# Patient Record
Sex: Male | Born: 1940
Health system: Southern US, Community
[De-identification: ages and names within clinical notes are randomized; demographics above are authoritative.]

## PROBLEM LIST (undated history)

## (undated) DIAGNOSIS — I2510 Atherosclerotic heart disease of native coronary artery without angina pectoris: Secondary | ICD-10-CM

## (undated) DIAGNOSIS — I4819 Other persistent atrial fibrillation: Secondary | ICD-10-CM

## (undated) DIAGNOSIS — E785 Hyperlipidemia, unspecified: Secondary | ICD-10-CM

## (undated) DIAGNOSIS — Z8249 Family history of ischemic heart disease and other diseases of the circulatory system: Secondary | ICD-10-CM

## (undated) DIAGNOSIS — Z87891 Personal history of nicotine dependence: Secondary | ICD-10-CM

## (undated) DIAGNOSIS — Z9289 Personal history of other medical treatment: Secondary | ICD-10-CM

## (undated) DIAGNOSIS — F0390 Unspecified dementia without behavioral disturbance: Secondary | ICD-10-CM

## (undated) HISTORY — DX: Other persistent atrial fibrillation: I48.19

## (undated) HISTORY — DX: Hyperlipidemia, unspecified: E78.5

## (undated) HISTORY — PX: PERCUTANEOUS CORONARY STENT INTERVENTION (PCI-S): SHX6016

## (undated) HISTORY — DX: Atherosclerotic heart disease of native coronary artery without angina pectoris: I25.10

## (undated) HISTORY — DX: Family history of ischemic heart disease and other diseases of the circulatory system: Z82.49

## (undated) HISTORY — DX: Personal history of other medical treatment: Z92.89

## (undated) HISTORY — PX: CARDIAC CATHETERIZATION: SHX172

## (undated) HISTORY — DX: Personal history of nicotine dependence: Z87.891

## (undated) HISTORY — PX: CORONARY STENT PLACEMENT: SHX1402

---

## 2005-03-12 ENCOUNTER — Observation Stay (HOSPITAL_COMMUNITY): Admission: EM | Admit: 2005-03-12 | Discharge: 2005-03-15 | Payer: Self-pay | Admitting: *Deleted

## 2005-03-29 ENCOUNTER — Ambulatory Visit: Payer: Self-pay | Admitting: Cardiology

## 2005-08-15 ENCOUNTER — Ambulatory Visit: Payer: Self-pay | Admitting: Cardiology

## 2005-08-16 ENCOUNTER — Ambulatory Visit: Payer: Self-pay | Admitting: Cardiology

## 2006-12-17 ENCOUNTER — Ambulatory Visit: Payer: Self-pay | Admitting: Cardiology

## 2006-12-17 LAB — CONVERTED CEMR LAB
Cholesterol: 122 mg/dL (ref 0–200)
HDL: 49.6 mg/dL (ref >39.0)
LDL Cholesterol: 68 mg/dL (ref 0–99)
Total CHOL/HDL Ratio: 2.5
Triglycerides: 24 mg/dL (ref 0–149)
VLDL: 5 mg/dL (ref 0–40)

## 2006-12-18 ENCOUNTER — Ambulatory Visit: Payer: Self-pay | Admitting: Cardiology

## 2006-12-18 LAB — CONVERTED CEMR LAB: TSH: 1.62 microintl units/mL (ref 0.35–5.50)

## 2008-01-22 ENCOUNTER — Ambulatory Visit: Payer: Self-pay | Admitting: Cardiology

## 2008-01-23 ENCOUNTER — Ambulatory Visit: Payer: Self-pay | Admitting: Cardiology

## 2008-01-23 LAB — CONVERTED CEMR LAB
ALT: 23 units/L (ref 0–53)
AST: 23 units/L (ref 0–37)
Albumin: 3.6 g/dL (ref 3.5–5.2)
Alkaline Phosphatase: 66 units/L (ref 39–117)
Bilirubin, Direct: 0.2 mg/dL (ref 0.0–0.3)
Cholesterol: 237 mg/dL (ref 0–200)
Direct LDL: 190 mg/dL
HDL: 46.4 mg/dL (ref >39.0)
Total Bilirubin: 1 mg/dL (ref 0.3–1.2)
Total CHOL/HDL Ratio: 5.1
Total Protein: 7.2 g/dL (ref 6.0–8.3)
Triglycerides: 35 mg/dL (ref 0–149)
VLDL: 7 mg/dL (ref 0–40)

## 2008-10-08 ENCOUNTER — Telehealth: Payer: Self-pay | Admitting: Cardiology

## 2008-12-22 ENCOUNTER — Encounter (INDEPENDENT_AMBULATORY_CARE_PROVIDER_SITE_OTHER): Payer: Self-pay | Admitting: *Deleted

## 2010-09-13 NOTE — Assessment & Plan Note (Signed)
[facility]                            CARDIOLOGY OFFICE NOTE   NAME:Lader, Tyaire                          MRN:          [id number]  DATE:12/18/2006                            DOB:          01-15-1941    Mr. Niemczyk is a pleasant, active gentleman who has coronary disease  that has been quite stable.  He is very knowledgeable about diet and he  is concerned because he has gained some weight even though his calories  are limited.  I mentioned to him that we will check a TSH to be sure  that there is no thyroid issue.  Otherwise, I think it is unlikely that  there is any metabolic basis other than the calories, although he does  limit his calories.  He is not having chest pain or shortness of breath.  He goes about full activities.   PAST MEDICAL HISTORY:  ALLERGIES:  NO KNOWN DRUG ALLERGIES.   MEDICATIONS:  Aspirin, Zocor, Toprol XL, fish oil, coenzyme Q-10 and  magnesium   OTHER MEDICAL PROBLEMS:  See the list below.   REVIEW OF SYSTEMS:  Patient has a rash that is diffuse, most marked on  his hands.  I wonder if this is psoriasis.  I have encouraged him to see  a Dermatologist.  I do not think that this is a drug rash.  Otherwise  his review of systems is negative.   PHYSICAL EXAMINATION:  Blood pressure is 138/84 with a pulse of 64.  The  patient is oriented to person, time and place and his affect is normal.  HEENT:  Reveals no xanthelasma.  He has normal extraocular motion.  SKIN:  Reveals an erythematous rash with some scaling that may be  psoriasis, it is diffuse.  There are no carotid bruits.  There is no jugular venous distention.  LUNGS:  Clear.  Respiratory effort is not labored.  CARDIAC:  Reveals an S1 with an S2, there are no clicks or significant  murmurs.  ABDOMEN:  Soft.  There are no masses or bruits.  There are normal bowel  sounds.  He has no peripheral edema.  There are no musculoskeletal deformities.   EKG is normal.  His  recent cholesterol showed an LDL of 68 with an HDL  of 49 and triglycerides of 24 on 40 of Zocor and he is quite stable with  this.   The patient had questions about magnesium.  I told him I felt that his  diet would be adequate but that there is no problem with a small  magnesium supplement.  Concerning vitamin D, I do think it is very  reasonable to take a small, appropriate supplement of vitamin D.   PROBLEMS:  1. Remote tobacco use.  2. Hyperlipidemia, nicely treated.  3. Coronary disease, treated with a Liberty bare metal stent in the      past.  4. History of good left ventricular function.  5. Skin rash that may be psoriasis, I have asked him to see a      Dermatologist and I would strongly recommend  this.     Luis Abed, MD, [facility]  Electronically Signed    JDK/MedQ  DD: 12/18/2006  DT: 12/18/2006  Job #: [id number]   cc:   [facility]

## 2010-09-13 NOTE — Assessment & Plan Note (Signed)
[facility]                            CARDIOLOGY OFFICE NOTE   NAME:[name], [name]                          MRN:          [id number]  DATE:01/22/2008                            DOB:          Nov 29, 1940    [name] [name] is here for cardiology followup.  I saw him a year ago.  At  that time, he had a rash and he did see Dermatology.  He was treated  with a cream that did help.  It seems that he gets this rash at this  time of the year and he has had a return of the same thing.  He has  gained weight.  His blood pressure is elevated by our measurement today.  He checks his pressure at home reliably and it is definitely not  elevated at home.  He will buy a new blood pressure cuff and report his  blood pressures to [place].   The patient has coronary disease.  He needs be on a statin.  In early  May, he had cramps in his legs.  This was at nighttime.  He called and  stopped his Zocor and about a week later, the cramps disappeared.  We  are not sure if it is related to Zocor.   He is not having any chest pain, shortness of breath, or syncope or  presyncope.   ALLERGIES:  No known drug allergies, but he may not tolerate statins  adequately.   MEDICATIONS:  Aspirin, Toprol, fish oil, coenzyme Q10, and magnesium.  He will restart his Zocor.   OTHER MEDICAL PROBLEMS:  See the list below.   REVIEW OF SYSTEMS:  The patient's rash is back.  He also has diffuse  mild areas of mild swelling if not induration of his skin.  He needs to  follow up with Dermatology.  The etiology of his rash is not clear to  me.  Otherwise, his review of systems is negative.   PHYSICAL EXAMINATION:  VITAL SIGNS:  As mentioned when he first came in,  his blood pressure was elevated.  It was in the range of 170/100.  He  will check it carefully at home.  GENERAL:  The patient is oriented to person, time, and place.  Affect is  normal.  HEENT:  Reveals no xanthelasma.  He has normal  extraocular motion.  There are no carotid bruits.  There is no jugular venous distention.  LUNGS:  Clear.  Respiratory effort is not labored.  CARDIAC:  Reveals an S1 with an S2.  There are no clicks or significant  murmurs.  ABDOMEN:  Soft.  The patient has mild swelling in both his ankles.  The  left is greater than the right.  He has 2+ posterior tibial pulses.  He  has a skin rash on his arm and on his chest and around his neck.   LABORATORY DATA:  EKG is normal.   PROBLEMS:  1. History of remote tobacco use.  2. Hyperlipidemia.  See the discussion above.  I have encouraged him      to restart  his Zocor.  3. Coronary artery disease with a Liberte bare-metal stent in the      past.  4. History of good left ventricular function.  5. Skin rash.   I have encouraged the patient to try to restart his Zocor when he can.  He will have the skin rash looked into.  He also has gained weight and  we talked about this and he will try to lose weight.     I will see him back in 1 year for cardiology followup.     Luis Abed, MD, [facility]  Electronically Signed    JDK/MedQ  DD: 01/22/2008  DT: 01/23/2008  Job #: [id number]   cc:   [facility] [place]

## 2010-09-16 NOTE — Cardiovascular Report (Signed)
NAMEYITZCHOK, CARRIGER NO.:  [id number]   MEDICAL RECORD NO.:  [id number]          PATIENT TYPE:  INP   LOCATION:  3304                         FACILITY:  [facility]   PHYSICIAN:  Rollene Rotunda, M.D.   DATE OF BIRTH:  Jun 21, 1940   DATE OF PROCEDURE:  03/13/2005  DATE OF DISCHARGE:                              CARDIAC CATHETERIZATION   PRIMARY CARE PHYSICIAN:  Dr. __________   REASON FOR PRESENTATION:  Evaluate patient with chest pain.   PROCEDURAL NOTE:  Left heart catheterization performed via the right femoral  artery.  The artery was cannulated using anterior wall puncture.  A #6-  French arterial sheath was inserted via the modified Seldinger technique.  Preformed Judkins and a pigtail catheter were utilized.  Patient tolerated  procedure well and left the laboratory in stable condition.   RESULTS:   HEMODYNAMICS:  LV 110/11, AO 110/67.   CORONARIES:  The left main had luminal irregularities.  The LAD had proximal  90% stenosis followed by mid and distal diffuse luminal irregularities as  the vessel wrapped the apex.  A first diagonal was small with mid 30%  stenosis.  Second diagonal branching with an ostial 25% stenosis.  An  inferior branch had a 60% stenosis, but was a small segment.  Second septal  perforator had ostial 95% stenosis.  The circumflex in the AV groove had  diffuse luminal irregularities.  A mid obtuse marginal had a long proximal  25% stenosis.  The right coronary artery was a large, dominant vessel.  There was long 25% stenosis.  PDA was moderate in size with luminal  irregularities.  Posterolateral 1 was moderate size with luminal  irregularities.  Posterolateral 2 was small to moderate with ostial 30%  stenosis.   LEFT VENTRICULOGRAM:  Left ventriculogram was obtained in the RAO  projection.  The EF was 65% with normal wall motion.   CONCLUSION:  Severe single vessel left anterior descending stenosis.  Well  preserved ejection  fraction.   PLAN:  I have reviewed this with Dr. Juanda Chance.  The LAD lesion is proximal.  We will need to look at films further and discuss this to decide whether  management with stenting is optimal versus treatment with single vessel  CABG.           ______________________________  Rollene Rotunda, M.D.     JH/MEDQ  D:  03/13/2005  T:  03/13/2005  Job:  [id number]

## 2010-09-16 NOTE — Cardiovascular Report (Signed)
[name], [name] [name] NO.:  [id number]   MEDICAL RECORD NO.:  [id number]          PATIENT TYPE:  INP   LOCATION:  6527                         FACILITY:  [facility]   PHYSICIAN:  Charlies Constable, M.D. [facility] DATE OF BIRTH:  1941/04/09   DATE OF PROCEDURE:  03/14/2005  DATE OF DISCHARGE:                              CARDIAC CATHETERIZATION   OPERATION/PROCEDURE:  Percutaneous coronary intervention.   CLINICAL HISTORY:  Mr. [name] [name] [name] is 70 years old and is self-employed and has  his own business.  He is an Art gallery manager.  He was admitted to the hospital after  a syncopal episode with sharp, burning chest pain.  His enzymes were  negative for myocardial infarction but he had anterior T wave changes.  He  underwent angiography by Dr. Antoine Poche and was found to have a tight lesion  in the ostium of the LAD, estimated at 90%.  He had moderate disease in the  mid LAD, estimated at 50-70% and he had non-obstructive disease in the  circumflex and right coronary artery and normal left ventricular function.  After some debate, between revascularization with surgery versus PCI, the  decision was made to perform PCI.  We had planned to debulk the lesion with  atherectomy prior to stenting   DESCRIPTION OF PROCEDURE:  The procedure was performed via the left femoral  artery and arterial sheath and an 8-French Q-3.5 guiding catheter with  sideholes.  The patient was given Angiomax bolus infusion and was given 600  mg of Plavix three to four hours before the procedure.  We initially crossed  the lesion with the Flexi wire without difficulty.  We predilated with 2.5 x  20 mm Maverick, performing two inflations after 8 atmospheres for 30  seconds.  We then passed the 3.5 x 4 Flexi-cutter into the ostium of the  LAD.  We performed four cuts at 1/2 atmospheres and four cuts at 1  atmospheres and then removed the cutter and obtained a moderate amount of  material with improvement in the lumen  diameter.  We then went back with the  cutter and performed four cuts at 2 atmospheres and four cuts at 3  atmospheres, and then removed the cutter and got moderately large amount of  atheromatous material.  This resulted in a good lumen that appeared to be  about 30% narrowed.   We then performed intravascular ultrasound to assess the size of the lesion.  The lumen appeared to be about 3 mm and the distal reference appeared to be  about 3.5 mm in diameters.   We chose a 3.5 x 12 mm Liberte stent and we were very careful to try and  position the edge of the stent right at the ostium of the LAD.  The purpose  of our debulking first was so that we would not compromise the circumflex  when we deployed the stent.  We then deployed the stent with one inflation  to 14 atmospheres for 30 seconds.  We pulse dilated the distal portion of  the stent with a 4 x 8 mm Quantum Maverick and  performing one inflation up  to 14 atmospheres for 30 seconds.  We then performed another IVUS run.  Final diagnostic study was then performed through the guiding catheter.  The  left femoral artery was closed with AngioSeal at the end of the procedure.   It was a long procedure but the patient tolerated the procedure well and  left the laboratory in satisfactory condition.   RESULTS:  Initially the stenosis in the ostium of the LAD was estimated at  90%.  Following directional atherectomy, this improved to 30% and following  stenting, this improved to 0%.  On IVUS, the stent was about 1.5 mm from the  ostium although the ostial lumen was 3 mm.  The stent diameter appeared to  be about 3.5 mm with good distal transition.   CONCLUSION:  Successful percutaneous coronary intervention of the lesion in  the ostium of the left anterior descending artery using directional  atherectomy and a bare metal stent with improvement in central narrowing  from 90% to 0%.   DISPOSITION:  The patient returned to the anesthesia  room for further  observation.           ______________________________  Charlies Constable, M.D. [facility]     BB/MEDQ  D:  03/14/2005  T:  03/15/2005  Job:  [id number]   cc:   Willa Rough, M.D.  [address]  [address]  [place]  [place] [address]   [facility]   Cardiopulmonary Lab
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
## 2010-09-16 NOTE — Discharge Summary (Signed)
[name] [name], [name] [name]                 ACCOUNT NO.:  [id number]   MEDICAL RECORD NO.:  [id number]          PATIENT TYPE:  INP   LOCATION:  6527                         FACILITY:  [facility]   PHYSICIAN:  Charlies Constable, M.D. [facility] DATE OF BIRTH:  09-Sep-1940   DATE OF ADMISSION:  03/12/2005  DATE OF DISCHARGE:  03/15/2005                                 DISCHARGE SUMMARY   PRINCIPAL DIAGNOSIS:  Chest pain, coronary artery disease.   OTHER DIAGNOSES:  1.  Remote tobacco abuse.  2.  Hyperlipidemia.  3.  Family history of early coronary artery disease.   ALLERGIES:  No known drug allergies.   PROCEDURE:  Left heart cardiac catheterization, followed by PCI and stenting  of the proximal LAD with bare mental stent.   HISTORY OF PRESENT ILLNESS:  A 70 year old white male with no prior history  of CAD who presented to the emergency room on March 12, 2005, following a  2 to 3-week history of right sided exertional chest discomfort that became  progressively more severe and frequent on the day of admission.  He ruled  out for MI and was admitted for further evaluation.   HOSPITAL COURSE:  Mr. Ala underwent left heart cardiac catheterization,  on March 13, 2005, which revealed a 90% lesion in the proximal LAD and  otherwise nonobstructive coronary disease.  LV function was normal with an  EF of 65% and normal wall motion.  Films were reviewed with Dr. Juanda Chance and  the patient was taken back to the cardiac cath lab, on March 14, 2005,  for successful PCI and stenting of the proximal LAD with a 3.5 x 12-mm  Liberte bare metal stent.  The patient tolerated this procedure well and has  been ambulating this morning without any recurrent chest discomfort or  limitations.  He is being discharged home today in satisfactory condition.   DISCHARGE LABORATORY:  Hemoglobin 13.1, hematocrit 38.2, WBC 6.1, platelets  128, MCV 88.0.  Sodium 137, potassium 3.7, chloride 110, CO2 23, BUN 10,  creatinine  1.1, glucose 87.  Total bilirubin 0.8, alkaline phosphatase 66,  AST 38, ALT 30.  CK 78, MB 2.0, troponin I 0.12.  Total cholesterol 130,  triglycerides 32, HDL 46, LDL 78.  Calcium 8.3.  TSH 2.830.   DISPOSITION:  The patient is being discharged home today in good condition.   FOLLOWUP PLANS AND APPOINTMENTS:  The patient is to follow up with Dr. Myrtis Ser  in [facility] on March 29, 2005 at 9:45 a.m.   DISCHARGE MEDICATIONS:  1.  Aspirin 325 mg every day.  2.  Plavix 75 mg every day.  3.  Zocor 40 mg q.h.s.  4.  Toprol XL 50 mg every day.  5.  Nitroglycerin 0.4 mg sublingual p.r.n. chest pain.   OUTSTANDING LABORATORY/STUDIES:  None.   DURATION OF DISCHARGE ENCOUNTER:  Forty minutes including physician time.      Ok Anis, NP    ______________________________  Charlies Constable, M.D. [facility]    CRB/MEDQ  D:  03/15/2005  T:  03/15/2005  Job:  [id number]

## 2015-01-05 DIAGNOSIS — H15001 Unspecified scleritis, right eye: Secondary | ICD-10-CM | POA: Diagnosis not present

## 2015-01-05 DIAGNOSIS — H35033 Hypertensive retinopathy, bilateral: Secondary | ICD-10-CM | POA: Diagnosis not present

## 2015-01-05 DIAGNOSIS — H35372 Puckering of macula, left eye: Secondary | ICD-10-CM | POA: Diagnosis not present

## 2015-01-05 DIAGNOSIS — H2513 Age-related nuclear cataract, bilateral: Secondary | ICD-10-CM | POA: Diagnosis not present

## 2015-01-07 DIAGNOSIS — I252 Old myocardial infarction: Secondary | ICD-10-CM | POA: Diagnosis not present

## 2015-01-07 DIAGNOSIS — H2513 Age-related nuclear cataract, bilateral: Secondary | ICD-10-CM | POA: Diagnosis not present

## 2015-01-07 DIAGNOSIS — Z87891 Personal history of nicotine dependence: Secondary | ICD-10-CM | POA: Diagnosis not present

## 2015-01-07 DIAGNOSIS — H30033 Focal chorioretinal inflammation, peripheral, bilateral: Secondary | ICD-10-CM | POA: Diagnosis not present

## 2015-01-07 DIAGNOSIS — H35033 Hypertensive retinopathy, bilateral: Secondary | ICD-10-CM | POA: Diagnosis not present

## 2015-01-07 DIAGNOSIS — H35371 Puckering of macula, right eye: Secondary | ICD-10-CM | POA: Diagnosis not present

## 2015-01-07 DIAGNOSIS — Z113 Encounter for screening for infections with a predominantly sexual mode of transmission: Secondary | ICD-10-CM | POA: Diagnosis not present

## 2015-01-12 DIAGNOSIS — H15001 Unspecified scleritis, right eye: Secondary | ICD-10-CM | POA: Diagnosis not present

## 2015-01-12 DIAGNOSIS — H35033 Hypertensive retinopathy, bilateral: Secondary | ICD-10-CM | POA: Diagnosis not present

## 2015-01-12 DIAGNOSIS — H35371 Puckering of macula, right eye: Secondary | ICD-10-CM | POA: Diagnosis not present

## 2015-02-02 DIAGNOSIS — H35371 Puckering of macula, right eye: Secondary | ICD-10-CM | POA: Diagnosis not present

## 2015-02-02 DIAGNOSIS — H35033 Hypertensive retinopathy, bilateral: Secondary | ICD-10-CM | POA: Diagnosis not present

## 2015-02-02 DIAGNOSIS — H15001 Unspecified scleritis, right eye: Secondary | ICD-10-CM | POA: Diagnosis not present

## 2015-02-02 DIAGNOSIS — H2513 Age-related nuclear cataract, bilateral: Secondary | ICD-10-CM | POA: Diagnosis not present

## 2015-02-23 DIAGNOSIS — H15001 Unspecified scleritis, right eye: Secondary | ICD-10-CM | POA: Diagnosis not present

## 2015-02-23 DIAGNOSIS — H35371 Puckering of macula, right eye: Secondary | ICD-10-CM | POA: Diagnosis not present

## 2015-02-23 DIAGNOSIS — H35033 Hypertensive retinopathy, bilateral: Secondary | ICD-10-CM | POA: Diagnosis not present

## 2015-02-23 DIAGNOSIS — H2513 Age-related nuclear cataract, bilateral: Secondary | ICD-10-CM | POA: Diagnosis not present

## 2015-04-07 DIAGNOSIS — H21542 Posterior synechiae (iris), left eye: Secondary | ICD-10-CM | POA: Diagnosis not present

## 2015-04-07 DIAGNOSIS — H2513 Age-related nuclear cataract, bilateral: Secondary | ICD-10-CM | POA: Diagnosis not present

## 2015-04-13 DIAGNOSIS — H15001 Unspecified scleritis, right eye: Secondary | ICD-10-CM | POA: Diagnosis not present

## 2015-04-13 DIAGNOSIS — H21542 Posterior synechiae (iris), left eye: Secondary | ICD-10-CM | POA: Diagnosis not present

## 2015-04-13 DIAGNOSIS — H2513 Age-related nuclear cataract, bilateral: Secondary | ICD-10-CM | POA: Diagnosis not present

## 2015-06-15 DIAGNOSIS — H2513 Age-related nuclear cataract, bilateral: Secondary | ICD-10-CM | POA: Diagnosis not present

## 2015-06-15 DIAGNOSIS — H21542 Posterior synechiae (iris), left eye: Secondary | ICD-10-CM | POA: Diagnosis not present

## 2015-06-15 DIAGNOSIS — H15001 Unspecified scleritis, right eye: Secondary | ICD-10-CM | POA: Diagnosis not present

## 2015-06-15 DIAGNOSIS — H35371 Puckering of macula, right eye: Secondary | ICD-10-CM | POA: Diagnosis not present

## 2015-07-06 DIAGNOSIS — H15001 Unspecified scleritis, right eye: Secondary | ICD-10-CM | POA: Diagnosis not present

## 2015-07-06 DIAGNOSIS — H35371 Puckering of macula, right eye: Secondary | ICD-10-CM | POA: Diagnosis not present

## 2015-07-06 DIAGNOSIS — H2513 Age-related nuclear cataract, bilateral: Secondary | ICD-10-CM | POA: Diagnosis not present

## 2015-07-06 DIAGNOSIS — H532 Diplopia: Secondary | ICD-10-CM | POA: Diagnosis not present

## 2015-07-06 DIAGNOSIS — H21542 Posterior synechiae (iris), left eye: Secondary | ICD-10-CM | POA: Diagnosis not present

## 2015-07-09 ENCOUNTER — Encounter (HOSPITAL_COMMUNITY): Payer: Self-pay | Admitting: *Deleted

## 2015-07-09 ENCOUNTER — Ambulatory Visit (INDEPENDENT_AMBULATORY_CARE_PROVIDER_SITE_OTHER): Payer: Medicare Other | Admitting: Cardiovascular Disease

## 2015-07-09 VITALS — BP 140/90 | HR 100 | Ht 69.0 in | Wt 186.8 lb

## 2015-07-09 DIAGNOSIS — R7989 Other specified abnormal findings of blood chemistry: Secondary | ICD-10-CM

## 2015-07-09 DIAGNOSIS — I4891 Unspecified atrial fibrillation: Secondary | ICD-10-CM | POA: Diagnosis not present

## 2015-07-09 DIAGNOSIS — E785 Hyperlipidemia, unspecified: Secondary | ICD-10-CM | POA: Insufficient documentation

## 2015-07-09 LAB — COMPREHENSIVE METABOLIC PANEL
ALK PHOS: 52 U/L (ref 40–115)
ALT: 14 U/L (ref 9–46)
AST: 19 U/L (ref 10–35)
Albumin: 4 g/dL (ref 3.6–5.1)
BUN: 16 mg/dL (ref 7–25)
CO2: 26 mmol/L (ref 20–31)
CREATININE: 1.23 mg/dL — AB (ref 0.70–1.18)
Calcium: 9.3 mg/dL (ref 8.6–10.3)
Chloride: 104 mmol/L (ref 98–110)
Glucose, Bld: 88 mg/dL (ref 65–99)
Potassium: 4.4 mmol/L (ref 3.5–5.3)
SODIUM: 140 mmol/L (ref 135–146)
Total Bilirubin: 0.7 mg/dL (ref 0.2–1.2)
Total Protein: 7 g/dL (ref 6.1–8.1)

## 2015-07-09 LAB — TSH: TSH: 2.03 m[IU]/L (ref 0.40–4.50)

## 2015-07-09 LAB — CBC
HCT: 44.2 % (ref 39.0–52.0)
HEMOGLOBIN: 14.5 g/dL (ref 13.0–17.0)
MCH: 28.9 pg (ref 26.0–34.0)
MCHC: 32.8 g/dL (ref 30.0–36.0)
MCV: 88.2 fL (ref 78.0–100.0)
MPV: 10.7 fL (ref 8.6–12.4)
Platelets: 146 10*3/uL — ABNORMAL LOW (ref 150–400)
RBC: 5.01 MIL/uL (ref 4.22–5.81)
RDW: 15.5 % (ref 11.5–15.5)
WBC: 5.7 10*3/uL (ref 4.0–10.5)

## 2015-07-09 MED ORDER — RIVAROXABAN 20 MG PO TABS: 20.0000 mg | ORAL_TABLET | Freq: Every day | ORAL | Status: DC

## 2015-07-09 MED ORDER — METOPROLOL SUCCINATE ER 25 MG PO TB24: 25.0000 mg | ORAL_TABLET | Freq: Every day | ORAL | Status: DC

## 2015-07-09 NOTE — Progress Notes (Signed)
Cardiology Office Note Date:  07/09/2015   ID:  [name] [name], DOB 10-Sep-1940, MRN [id number]  PCP:  No primary care provider on file.  Cardiologist:  Tonny Bollman, MD    Chief Complaint  Patient presents with  . New Evaluation    Eye doctor recommended he be seen. Denies any chest pain, sob, lee, or claudication     History of Present Illness: [name] [name] is a 75 y.o. male who presents for cardiac evaluation. He was seen recently by his opthamologist and was recommended to undergo cardiac evaluation. He has developed double vision and there was concern that he may have had an optical stroke. An MRI was recommended but he hasn't pursued this yet.   The patient has noticed his heart rate has been irregular and higher than normal over the past few months. He denies palpitations.  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   He complains of fatigue but really does not have specific cardiac complaints. Today, he denies symptoms of palpitations, chest pain, shortness of breath, orthopnea, PND, lower extremity edema, dizziness, or syncope.  He does have a hx of CAD - cath notes copied below. He underwent PCI in 2006 when he was found to have severe proximal LAD stenosis. He initially was seen by Dr Myrtis Ser but hasn't had cardiology FU in many years. He was intolerant/allergic to statin drugs. He's been off of plavix many years and has only recently started taking Aspirin again.  Past Medical History  Diagnosis Date  . History of tobacco abuse   . Hyperlipidemia   . FH: CAD (coronary artery disease)   . Chest pain   . CAD (coronary artery disease)     Past Surgical History  Procedure Laterality Date  . Cardiac catheterization    . Coronary stent placement      stents of the proximal lad with bare metal stent  . Percutaneous coronary stent intervention (pci-s)      Current Outpatient Prescriptions  Medication Sig  Dispense Refill  . Ascorbic Acid (VITAMIN C PO) Take 1 tablet by mouth daily.    Marland Kitchen aspirin 325 MG EC tablet Take 325 mg by mouth daily.    . Cholecalciferol (VITAMIN D3) 3000 units TABS Take 1 tablet by mouth daily.    . Coenzyme Q10 (CO Q 10 PO) Take 1 capsule by mouth daily.    Marland Kitchen MAGNESIUM PO Take 1 tablet by mouth daily.    . Menaquinone-7 (VITAMIN K2 PO) Take 1 tablet by mouth daily.     No current facility-administered medications for this visit.    Allergies:   Review of patient's allergies indicates no known allergies.   Social History:  The patient  reports that he quit smoking about 37 years ago. He has never used smokeless tobacco. He reports that he does not drink alcohol or use illicit drugs.   Family History:  The patient's  family history includes Cancer - Colon in his mother; Heart attack in his father; Hypertension in his father.   ROS:  Please see the history of present illness.  Otherwise, review of systems is positive for sinus congestion, cough, double vision, eczema.  All other systems are reviewed and negative.   PHYSICAL EXAM: VS:  BP 140/90 mmHg  Pulse 100  Ht  (1.753 m)  Wt 186 lb 12.8 oz (84.732 kg)  BMI 27.57 kg/m2 , BMI Body mass index is 27.57 kg/(m^2).  GEN: Well nourished, well developed, in no acute distress HEENT: normal Neck: no JVD, no masses. No carotid bruits Cardiac: irregularly irregular without murmur or gallop          Respiratory:  clear to auscultation bilaterally, normal work of breathing GI: soft, nontender, nondistended, + BS MS: no deformity or atrophy Ext: no pretibial edema, pedal pulses 2+= bilaterally Skin: warm and dry. There is a diffuse rash on the trunk Neuro:  Strength and sensation are intact Psych: euthymic mood, full affect  EKG:  EKG is ordered today. The ekg ordered today shows atrial fibrillation with RVR, HR 107 bpm, inferior infarct age-undetermined  Recent Labs: No results found for requested labs within  last 365 days.   Lipid Panel     Component Value Date/Time   CHOL 237* 01/23/2008 1110   TRIG 35 01/23/2008 1110   HDL 46.4 01/23/2008 1110   CHOLHDL 5.1 CALC 01/23/2008 1110   VLDL 7 01/23/2008 1110   LDLCALC 68 12/17/2006 1032   LDLDIRECT 190.0 01/23/2008 1110      Wt Readings from Last 3 Encounters:  07/09/15 186 lb 12.8 oz (84.732 kg)     Cardiac Studies Reviewed: Cath - 2006: HEMODYNAMICS: LV 110/11, AO 110/67.  CORONARIES: The left main had luminal irregularities. The LAD had proximal 90% stenosis followed by mid and distal diffuse luminal irregularities as the vessel wrapped the apex. A first diagonal was small with mid 30% stenosis. Second diagonal branching with an ostial 25% stenosis. An inferior branch had a 60% stenosis, but was a small segment. Second septal perforator had ostial 95% stenosis. The circumflex in the AV groove had diffuse luminal irregularities. A mid obtuse marginal had a long proximal 25% stenosis. The right coronary artery was a large, dominant vessel. There was long 25% stenosis. PDA was moderate in size with luminal irregularities. Posterolateral 1 was moderate size with luminal irregularities. Posterolateral 2 was small to moderate with ostial 30% stenosis.  LEFT VENTRICULOGRAM: Left ventriculogram was obtained in the RAO projection. The EF was 65% with normal wall motion.  CONCLUSION: Severe single vessel left anterior descending stenosis. Well preserved ejection fraction.  PLAN: I have reviewed this with Dr. Juanda ChanceBrodie. The LAD lesion is proximal. We will need to look at films further and discuss this to decide whether management with stenting is optimal versus treatment with single vessel CABG.  PCI - 2006: OPERATION/PROCEDURE: Percutaneous coronary intervention.  CLINICAL HISTORY: Mr. 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 He was  admitted to the hospital after a syncopal episode with sharp, burning chest pain. His enzymes were negative for myocardial infarction but he had anterior T wave changes. He underwent angiography by Dr. Antoine PocheHochrein and was found to have a tight lesion in the ostium of the LAD, estimated at 90%. He had moderate disease in the mid LAD, estimated at 50-70% and he had non-obstructive disease in the circumflex and right coronary artery and normal left ventricular function. After some debate, between revascularization with surgery versus PCI, the decision was made to perform PCI. We had planned to debulk the lesion with atherectomy prior to stenting  DESCRIPTION OF PROCEDURE: The procedure was performed via the left femoral artery and arterial sheath and an 8-French Q-3.5 guiding catheter with sideholes. The patient was given Angiomax bolus infusion and was given 600 mg of Plavix three to four hours before the procedure. We initially crossed the lesion with the Flexi wire without  difficulty. We predilated with 2.5 x 20 mm Maverick, performing two inflations after 8 atmospheres for 30 seconds. We then passed the 3.5 x 4 Flexi-cutter into the ostium of the LAD. We performed four cuts at 1/2 atmospheres and four cuts at 1 atmospheres and then removed the cutter and obtained a moderate amount of material with improvement in the lumen diameter. We then went back with the cutter and performed four cuts at 2 atmospheres and four cuts at 3 atmospheres, and then removed the cutter and got moderately large amount of atheromatous material. This resulted in a good lumen that appeared to be about 30% narrowed.  We then performed intravascular ultrasound to assess the size of the lesion. The lumen appeared to be about 3 mm and the distal reference appeared to be about 3.5 mm in diameters.  We chose a 3.5 x 12 mm Liberte stent and we were very careful to try  and position the edge of the stent right at the ostium of the LAD. The purpose of our debulking first was so that we would not compromise the circumflex when we deployed the stent. We then deployed the stent with one inflation to 14 atmospheres for 30 seconds. We pulse dilated the distal portion of the stent with a 4 x 8 mm Quantum Maverick and performing one inflation up to 14 atmospheres for 30 seconds. We then performed another IVUS run. Final diagnostic study was then performed through the guiding catheter. The left femoral artery was closed with AngioSeal at the end of the procedure.  It was a long procedure but the patient tolerated the procedure well and left the laboratory in satisfactory condition.  RESULTS: Initially the stenosis in the ostium of the LAD was estimated at 90%. Following directional atherectomy, this improved to 30% and following stenting, this improved to 0%. On IVUS, the stent was about 1.5 mm from the ostium although the ostial lumen was 3 mm. The stent diameter appeared to be about 3.5 mm with good distal transition.  CONCLUSION: Successful percutaneous coronary intervention of the lesion in the ostium of the left anterior descending artery using directional atherectomy and a bare metal stent with improvement in central narrowing from 90% to 0%.  DISPOSITION: [name] for further observation.  ASSESSMENT AND PLAN: 1.  Atrial fibrillation: unknown duration but suspect this has been ongoing at least 2-3 months based on patient's history. Lengthy discussion about implications of atrial fibrillation on both cardiovascular outcomes and stroke risk. Recommend the following:  2D echo to assess LV function, atrial size, valvular disease  Labs to include metabolic panel, CBC, TSH  CHADS-Vasc  = 3 (age, HTN, CAD) and possibly 5 if visual symptoms truly related to ocular stroke  Neurology  referral for appropriate imaging and stroke assessment  FU Afib clinic  Start anticoagulation with Xarelto 20 mg daily. Discussed indication, alternatives, appropriate actions if bleeding occurs  Start metoprolol for rate control  Consider cardioversion after 3 weeks therapeutic anticoagulation depending on echo/lab findings  2. CAD, native vessel: no angina at present. Stop ASA in setting oral anticoagulant Rx. Start beta-blocker. Pt statin-allergic  3. HTN - start metoprolol  Current medicines are reviewed with the patient today.  The patient does not have concerns regarding medicines.  Labs/ tests ordered today include:  No orders of the defined types were placed in this encounter.   Labs and cath notes reviewed as part of today's evaluation.   Disposition:   FU  AFib clinic. I will co-manage patient.  Enzo Bi, MD  07/09/2015 11:45 AM    [facility] [address] [place], [place], [place]  [address] Phone: [phone]; Fax: [phone]

## 2015-07-09 NOTE — Patient Instructions (Addendum)
Medication Instructions:  Your physician has recommended you make the following change in your medication:  1. START Xarelto 20mg  take one tablet by mouth daily with your evening meal 2. START Metoprolol Succinate 25mg  take one tablet by mouth daily at bedtime 3. STOP Aspirin  Labwork: Your physician recommends that you have lab work today: CMP, CBC, TSH   Testing/Procedures: Your physician has requested that you have an echocardiogram. Echocardiography is a painless test that uses sound waves to create images of your heart. It provides your doctor with information about the size and shape of your heart and how well your heart's chambers and valves are working. This procedure takes approximately one hour. There are no restrictions for this procedure.  Follow-Up: You have been referred to Dr Shon MilletAdam Jaffe with [facility] due to double vision and Atrial Fibrillation.   Your physician recommends that you schedule a follow-up appointment in: 4-6 weeks with the Atrial Fibrillation clinic   Any Other Special Instructions Will Be Listed Below (If Applicable).     If you need a refill on your cardiac medications before your next appointment, please call your pharmacy.

## 2015-07-23 ENCOUNTER — Ambulatory Visit (HOSPITAL_COMMUNITY): Payer: Medicare Other | Attending: Cardiovascular Disease

## 2015-07-23 ENCOUNTER — Other Ambulatory Visit: Payer: Self-pay

## 2015-07-23 DIAGNOSIS — I4891 Unspecified atrial fibrillation: Secondary | ICD-10-CM | POA: Diagnosis not present

## 2015-08-02 ENCOUNTER — Encounter: Payer: Self-pay | Admitting: Neurology

## 2015-08-02 ENCOUNTER — Ambulatory Visit (INDEPENDENT_AMBULATORY_CARE_PROVIDER_SITE_OTHER): Payer: Medicare Other | Admitting: Neurology

## 2015-08-02 ENCOUNTER — Other Ambulatory Visit (INDEPENDENT_AMBULATORY_CARE_PROVIDER_SITE_OTHER): Payer: Medicare Other

## 2015-08-02 VITALS — BP 134/84 | HR 97 | Ht 71.0 in | Wt 183.0 lb

## 2015-08-02 DIAGNOSIS — I4891 Unspecified atrial fibrillation: Secondary | ICD-10-CM

## 2015-08-02 DIAGNOSIS — H4922 Sixth [abducent] nerve palsy, left eye: Secondary | ICD-10-CM | POA: Diagnosis not present

## 2015-08-02 DIAGNOSIS — Z823 Family history of stroke: Secondary | ICD-10-CM

## 2015-08-02 DIAGNOSIS — E785 Hyperlipidemia, unspecified: Secondary | ICD-10-CM | POA: Diagnosis not present

## 2015-08-02 DIAGNOSIS — I729 Aneurysm of unspecified site: Secondary | ICD-10-CM

## 2015-08-02 LAB — LIPID PANEL
Cholesterol: 202 mg/dL — ABNORMAL HIGH (ref 0–200)
HDL: 61.7 mg/dL (ref >39.00)
LDL Cholesterol: 130 mg/dL — ABNORMAL HIGH (ref 0–99)
NonHDL: 140.41
Total CHOL/HDL Ratio: 3
Triglycerides: 50 mg/dL (ref 0.0–149.0)
VLDL: 10 mg/dL (ref 0.0–40.0)

## 2015-08-02 NOTE — Patient Instructions (Addendum)
I think you probably had a stroke 1.  Continue Xarelto 2.  We will get MRI and MRA of the head and carotid doppler 3.  I would like to check a fasting lipid panel 4.  Follow up after testing

## 2015-08-02 NOTE — Addendum Note (Signed)
Addended by: Sheilah MinsFOX, JADA A on: 08/02/2015 10:49 AM   Modules accepted: Orders

## 2015-08-02 NOTE — Addendum Note (Signed)
Addended by: Sheilah MinsFOX, JADA A on: 08/02/2015 10:02 AM   Modules accepted: Orders

## 2015-08-02 NOTE — Progress Notes (Signed)
NEUROLOGY CONSULTATION NOTE  [name] [name] MRN: [id number] DOB: Apr 30, 1941  Referring provider: Dr. Excell Seltzer Primary care provider: no PCP  Reason for consult:  Double vision, probable stroke  HISTORY OF PRESENT ILLNESS: [name] [name] is a 75 year old right-handed male with atrial fibrillation, hypertension, and CAD who presents for assessment of stroke in setting of atrial fibrillation.  History obtained by patient, and cardiology note.  EKG and labs reviewed.  In early March 2017, he developed horizontal double vision.  It resolves when closing either eye.  He denied associated headache or focal numbness and weakness.  He saw an ophthalmologist, who suspected an "optical stroke".  Over the past few months, he felt that his heart rate had been elevated and irregular.    He denied chest pain, shortness of breath, dizziness or loss of consciousness.  He saw cardiology, where EKG showed atrial fibrillation with RVR and heart rate of 107 bpm.  CBC and CMP were unremarkable except for borderline low platelet count of 146 and borderline elevated creatinine of 1.23.  Echocardiogram showed EF 55-60% with no cardiac source of emboli.  He was started on Xarelto.  Over the past month, he reports no change in vision.  He also reports history of recurrent eye infections.  He also reports exposure for possible mold.  PAST MEDICAL HISTORY: Past Medical History  Diagnosis Date  . History of tobacco abuse   . Hyperlipidemia   . FH: CAD (coronary artery disease)   . Chest pain   . CAD (coronary artery disease)     PAST SURGICAL HISTORY: Past Surgical History  Procedure Laterality Date  . Cardiac catheterization    . Coronary stent placement      stents of the proximal lad with bare metal stent  . Percutaneous coronary stent intervention (pci-s)      MEDICATIONS: Current Outpatient Prescriptions on File Prior to Visit  Medication Sig Dispense Refill  . Ascorbic Acid (VITAMIN C PO) Take 1 tablet by  mouth daily.    . Cholecalciferol (VITAMIN D3) 3000 units TABS Take 1 tablet by mouth daily.    . Coenzyme Q10 (CO Q 10 PO) Take 1 capsule by mouth daily.    Marland Kitchen MAGNESIUM PO Take 1 tablet by mouth daily.    . Menaquinone-7 (VITAMIN K2 PO) Take 1 tablet by mouth daily.    . metoprolol succinate (TOPROL-XL) 25 MG 24 hr tablet Take 1 tablet (25 mg total) by mouth at bedtime. 30 tablet 3  . rivaroxaban (XARELTO) 20 MG TABS tablet Take 1 tablet (20 mg total) by mouth daily with supper. 30 tablet 3   No current facility-administered medications on file prior to visit.    ALLERGIES: No Known Allergies  FAMILY HISTORY: Family History  Problem Relation Age of Onset  . Cancer - Colon Mother   . Heart attack Father   . Hypertension Father     SOCIAL HISTORY: Social History   Social History  . Marital Status: Married    Spouse Name: N/A  . Number of Children: N/A  . Years of Education: N/A   Occupational History  . Not on file.   Social History Main Topics  . Smoking status: Former Smoker    Quit date: 05/10/1978  . Smokeless tobacco: Never Used  . Alcohol Use: No  . Drug Use: No  . Sexual Activity: Not on file   Other Topics Concern  . Not on file   Social History Narrative    REVIEW OF  SYSTEMS: Constitutional: No fevers, chills, or sweats, no generalized fatigue, change in appetite Eyes: as above Ear, nose and throat: No hearing loss, ear pain, nasal congestion, sore throat Cardiovascular: No chest pain, palpitations Respiratory:  No shortness of breath at rest or with exertion, wheezes GastrointestinaI: No nausea, vomiting, diarrhea, abdominal pain, fecal incontinence Genitourinary:  No dysuria, urinary retention or frequency Musculoskeletal:  No neck pain, back pain Integumentary: No rash, pruritus, skin lesions Neurological: as above Psychiatric: No depression, insomnia, anxiety Endocrine: No palpitations, fatigue, diaphoresis, mood swings, change in appetite,  change in weight, increased thirst Hematologic/Lymphatic:  No anemia, purpura, petechiae. Allergic/Immunologic: no itchy/runny eyes, nasal congestion, recent allergic reactions, rashes  PHYSICAL EXAM: Filed Vitals:   08/02/15 0903  BP: 134/84  Pulse: 97   General: No acute distress.  Patient appears well-groomed.  Head:  Normocephalic/atraumatic Eyes:  fundi examined but not visualized Neck: supple, no paraspinal tenderness, full range of motion Back: No paraspinal tenderness Heart: regular rate and rhythm Lungs: Clear to auscultation bilaterally. Vascular: No carotid bruits. Neurological Exam: Mental status: alert and oriented to person, place, and time, recent and remote memory intact, fund of knowledge intact, attention and concentration intact, speech fluent and not dysarthric, language intact. Cranial nerves: CN I: not tested CN II: pupils equal, round and reactive to light, visual fields intact, CN III, IV, VI:  Left CN VI palsy, no nystagmus, no ptosis CN V: facial sensation intact CN VII: upper and lower face symmetric CN VIII: hearing intact CN IX, X: gag intact, uvula midline CN XI: sternocleidomastoid and trapezius muscles intact CN XII: tongue midline Bulk & Tone: normal, no fasciculations. Motor:  5/5 throughout  Sensation:  Pinprick and vibration sensation intact. Deep Tendon Reflexes:  2+ throughout, toes downgoing.  Finger to nose testing:  Without dysmetria.  Heel to shin:  Without dysmetria.  Gait:  Normal station and stride.  Able to turn and tandem walk. Romberg negative.  IMPRESSION: Left cranial nerve VI palsy.  Consider CVA.  In setting of newly diagnosed atrial fibrillation, likely cardioembolic, but should complete a full stroke workup.  However, we should rule out other possible causes of VI palsy in addition to stroke   PLAN: 1.  MRI with and without contrast and MRA of head 2.  Carotid doppler 3.  Fasting lipid panel 4.  Blood pressure  control 5.  Xarelto 6.  Eye patch 7.  Follow up after testing.   Thank you for allowing me to take part in the care of this patient.  Shon MilletAdam Aquan Kope, DO  CC:  Tonny BollmanMichael Cooper, MD

## 2015-08-04 ENCOUNTER — Ambulatory Visit
Admission: RE | Admit: 2015-08-04 | Discharge: 2015-08-04 | Disposition: A | Discharge status: Home / Self Care | Payer: Medicare Other | Source: Ambulatory Visit | Attending: Neurology | Admitting: Neurology

## 2015-08-04 DIAGNOSIS — I6523 Occlusion and stenosis of bilateral carotid arteries: Secondary | ICD-10-CM | POA: Diagnosis not present

## 2015-08-04 DIAGNOSIS — Z823 Family history of stroke: Secondary | ICD-10-CM

## 2015-08-05 ENCOUNTER — Encounter (HOSPITAL_COMMUNITY): Payer: Self-pay | Admitting: Nurse Practitioner

## 2015-08-05 ENCOUNTER — Telehealth: Payer: Self-pay

## 2015-08-05 ENCOUNTER — Ambulatory Visit (HOSPITAL_COMMUNITY)
Admission: RE | Admit: 2015-08-05 | Discharge: 2015-08-05 | Disposition: A | Discharge status: Home / Self Care | Payer: Medicare Other | Source: Ambulatory Visit | Attending: Nurse Practitioner | Admitting: Nurse Practitioner

## 2015-08-05 VITALS — BP 160/82 | HR 88 | Ht 71.0 in | Wt 185.4 lb

## 2015-08-05 DIAGNOSIS — I2510 Atherosclerotic heart disease of native coronary artery without angina pectoris: Secondary | ICD-10-CM | POA: Insufficient documentation

## 2015-08-05 DIAGNOSIS — I4891 Unspecified atrial fibrillation: Secondary | ICD-10-CM | POA: Diagnosis present

## 2015-08-05 DIAGNOSIS — E785 Hyperlipidemia, unspecified: Secondary | ICD-10-CM | POA: Diagnosis not present

## 2015-08-05 DIAGNOSIS — Z955 Presence of coronary angioplasty implant and graft: Secondary | ICD-10-CM | POA: Insufficient documentation

## 2015-08-05 DIAGNOSIS — Z8249 Family history of ischemic heart disease and other diseases of the circulatory system: Secondary | ICD-10-CM | POA: Insufficient documentation

## 2015-08-05 DIAGNOSIS — Z79899 Other long term (current) drug therapy: Secondary | ICD-10-CM | POA: Insufficient documentation

## 2015-08-05 DIAGNOSIS — Z7901 Long term (current) use of anticoagulants: Secondary | ICD-10-CM | POA: Insufficient documentation

## 2015-08-05 DIAGNOSIS — Z87891 Personal history of nicotine dependence: Secondary | ICD-10-CM | POA: Insufficient documentation

## 2015-08-05 DIAGNOSIS — I481 Persistent atrial fibrillation: Secondary | ICD-10-CM | POA: Diagnosis not present

## 2015-08-05 DIAGNOSIS — I4819 Other persistent atrial fibrillation: Secondary | ICD-10-CM

## 2015-08-05 MED ORDER — RIVAROXABAN 20 MG PO TABS: 20.0000 mg | ORAL_TABLET | Freq: Every day | ORAL | Status: DC

## 2015-08-05 NOTE — Patient Instructions (Signed)
Our fax # [phone] -- have pre-authorization forms from pharmacy sent if not filled out.

## 2015-08-05 NOTE — Progress Notes (Signed)
Patient ID: [name] [name], male   DOB: 08/28/1940, 75 y.o.   MRN: [id number]     Primary Care Physician: No PCP Per Patient Referring Physician: Dr. Michaela [name]   [name] [name] is a 75 y.o. male with a h/o, CAD with hx of PCI of proximal LAD, persistent afib for evaluation in the afib office. He was recently seen by his ophthalmologist and was recommended to undergo cardiac revaluation. He had developed double vision and there was concern that he may have had an optical stroke. Symptoms of  elevated irregular heart noted since early March. MRI   scheduled but has  not been done yet. He saw Dr. Excell Seltzerooper where EKG revealed afib with RVR with HR of 107 bpm. EF showed EF at 55-60% with no cardiac source of emboli. He was started on xarelto. He has been seen by neurology.  He is in the afib clinic today with Dr. Earmon [name]'s plan to be cardioverted after anticoagulation x 3 weeks. Metoprolol was started and rate controlled. Pt states that the afib doesn't really bother him and shows some hesitation to have cardioversion at this time. He warts to get a few more things done and return in one month to further discuss. He tells me about researching afib and reading a book from Dr. Lamont SnowballStephen Davis, written in 2014, [facility]  that talks about earthing or grounding to help restore SR. One way for this to occur is to stand barefoot in a garden and the earth will help to reset the body's normal electrical fields by absorbing electrons from the earth.This is very interesting to the pt since he is a retired Acupuncturistelectrical engineer.  He is being compliant with xarelto and has been on samples and stated that xarelto is going to cost over $300 dollars and will give 30 free day coupon and he was instructed to ask pharmacist if PA is needed and to fax form her if that is the case.  Today, he denies symptoms of palpitations, chest pain, shortness of breath, orthopnea, PND, lower extremity edema, dizziness, presyncope,  syncope, or neurologic sequela. The patient is tolerating medications without difficulties and is otherwise without complaint today.   Past Medical History  Diagnosis Date  . History of tobacco abuse   . Hyperlipidemia   . FH: CAD (coronary artery disease)   . Chest pain   . CAD (coronary artery disease)    Past Surgical History  Procedure Laterality Date  . Cardiac catheterization    . Coronary stent placement      stents of the proximal lad with bare metal stent  . Percutaneous coronary stent intervention (pci-s)      Current Outpatient Prescriptions  Medication Sig Dispense Refill  . Ascorbic Acid (VITAMIN C PO) Take 1 tablet by mouth daily.    . Cholecalciferol (VITAMIN D3) 3000 units TABS Take 1 tablet by mouth daily.    . Coenzyme Q10 (CO Q 10 PO) Take 1 capsule by mouth daily.    Marland Kitchen. MAGNESIUM PO Take 1 tablet by mouth daily.    . Menaquinone-7 (VITAMIN K2 PO) Take 1 tablet by mouth daily.    . metoprolol succinate (TOPROL-XL) 25 MG 24 hr tablet Take 1 tablet (25 mg total) by mouth at bedtime. 30 tablet 3  . Probiotic Product (PROBIOTIC ADVANCED PO) Take by mouth.    . rivaroxaban (XARELTO) 20 MG TABS tablet Take 1 tablet (20 mg total) by mouth daily with supper. 30 tablet 0   No current  facility-administered medications for this encounter.    No Known Allergies  Social History   Social History  . Marital Status: Married    Spouse Name: N/A  . Number of Children: N/A  . Years of Education: N/A   Occupational History  . Not on file.   Social History Main Topics  . Smoking status: Former Smoker    Quit date: 05/10/1978  . Smokeless tobacco: Never Used  . Alcohol Use: No  . Drug Use: No  . Sexual Activity: Not on file   Other Topics Concern  . Not on file   Social History Narrative    Family History  Problem Relation Age of Onset  . Cancer - Colon Mother   . Heart attack Father   . Hypertension Father     ROS- All systems are reviewed and negative  except as per the HPI above  Physical Exam: Filed Vitals:   08/05/15 1509  BP: 160/82  Pulse: 88  Height:  (1.803 m)  Weight: 185 lb 6.4 oz (84.097 kg)    GEN- The patient is well appearing, alert and oriented x 3 today.   Head- normocephalic, atraumatic Eyes-  Sclera clear, conjunctiva pink Ears- hearing intact Oropharynx- clear Neck- supple, no JVP Lymph- no cervical lymphadenopathy Lungs- Clear to ausculation bilaterally, normal work of breathing Heart- Irregular rate and rhythm, no murmurs, rubs or gallops, PMI not laterally displaced GI- soft, NT, ND, + BS Extremities- no clubbing, cyanosis, or edema MS- no significant deformity or atrophy Skin- no rash or lesion Psych- euthymic mood, full affect Neuro- strength and sensation are intact  EKG- afib with  one pvc, IRBBB,QRS int 106 ms, Qtc 467 ms Epic records reviewed  Assessment and Plan: 1. Persistent asymptomatic afib Pt defers cardioversion at this time He is rate controlled and tolerating I feel that he really wants more time to try some of the earthing/grounding suggestions that he found thru internet research mentioned in HPI I told him I had no knowledge of this and was  skeptical of theory He was also told the longer he is in afib,  diminishes abliity to restore rhythm He continues on xarelto, understands importance  F/u in one month  [facility] [address] [place], [place] [address] [phone]  He will return in one month and discuss DCCV further.

## 2015-08-05 NOTE — Telephone Encounter (Signed)
Called. Pt stated he would call right back.

## 2015-08-05 NOTE — Telephone Encounter (Signed)
-----   Message from Drema DallasAdam R Jaffe, DO sent at 08/05/2015  7:25 AM EDT ----- Carotid doppler looks okay.  I would recommend repeating it in 2 years.

## 2015-08-05 NOTE — Telephone Encounter (Signed)
Message relayed to patient. Verbalized understanding and denied questions.   

## 2015-08-07 ENCOUNTER — Ambulatory Visit
Admission: RE | Admit: 2015-08-07 | Discharge: 2015-08-07 | Disposition: A | Discharge status: Home / Self Care | Payer: Medicare Other | Source: Ambulatory Visit | Attending: Neurology | Admitting: Neurology

## 2015-08-07 DIAGNOSIS — I4891 Unspecified atrial fibrillation: Secondary | ICD-10-CM

## 2015-08-07 DIAGNOSIS — Z823 Family history of stroke: Secondary | ICD-10-CM

## 2015-08-07 DIAGNOSIS — H4922 Sixth [abducent] nerve palsy, left eye: Secondary | ICD-10-CM

## 2015-08-07 DIAGNOSIS — H538 Other visual disturbances: Secondary | ICD-10-CM | POA: Diagnosis not present

## 2015-08-07 DIAGNOSIS — E785 Hyperlipidemia, unspecified: Secondary | ICD-10-CM

## 2015-08-07 DIAGNOSIS — I729 Aneurysm of unspecified site: Secondary | ICD-10-CM

## 2015-08-07 MED ORDER — GADOBENATE DIMEGLUMINE 529 MG/ML IV SOLN: 15.0000 mL | Freq: Once | INTRAVENOUS | Status: DC | PRN

## 2015-08-09 ENCOUNTER — Telehealth: Payer: Self-pay | Admitting: *Deleted

## 2015-08-09 ENCOUNTER — Other Ambulatory Visit: Payer: Self-pay | Admitting: Neurology

## 2015-08-09 ENCOUNTER — Other Ambulatory Visit: Payer: Medicare Other

## 2015-08-09 ENCOUNTER — Telehealth: Payer: Self-pay

## 2015-08-09 DIAGNOSIS — H4922 Sixth [abducent] nerve palsy, left eye: Secondary | ICD-10-CM | POA: Diagnosis not present

## 2015-08-09 NOTE — Telephone Encounter (Signed)
-----   Message from Drema DallasAdam R Jaffe, DO sent at 08/09/2015 10:56 AM EDT ----- I spoke with Mr. [name] [name] to discuss MRI results.  There is no stroke or aneurysm.  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

## 2015-08-09 NOTE — Telephone Encounter (Signed)
Patient wants a call back form Dr. Earmon Phoenixooper's nurse Leotis Shames(Lauren) to see if Dr. Excell Seltzerooper wants to change his medications since he is done with his testing from neurology.  [facility] results.

## 2015-08-09 NOTE — Telephone Encounter (Signed)
Pt is aware. Will come by one day this week to have drawn.

## 2015-08-10 LAB — LYME AB/WESTERN BLOT REFLEX: B BURGDORFERI AB IGG+ IGM: 1.3 {index} — AB (ref <0.90)

## 2015-08-11 ENCOUNTER — Other Ambulatory Visit: Payer: Medicare Other

## 2015-08-11 NOTE — Telephone Encounter (Signed)
Follow up   Pt called and stated that he is returning rn call

## 2015-08-11 NOTE — Telephone Encounter (Signed)
I spoke with the pt and he wanted to know if his Xarelto needs to be changed since he did not have a stroke based on MRI results.  I made the pt aware that he is taking Xarelto due to Atrial Fibrillation.  I spoke with the pt about other NOACs and warfarin and he is already aware of these alternatives.  The pt will continue Xarelto and is in the process of doing "grounding or earthing" to treat his Atrial Fibrillation.

## 2015-08-11 NOTE — Telephone Encounter (Signed)
Left message on machine for pt to contact the office.   

## 2015-08-12 ENCOUNTER — Telehealth: Payer: Self-pay | Admitting: Neurology

## 2015-08-12 LAB — LYME ABY, WSTRN BLT IGG & IGM W/BANDS

## 2015-08-12 NOTE — Telephone Encounter (Signed)
[name] [name] 01-Apr-2041 called wondering if by chance he had missed your call or any results were in for him. He would like you to call him back at [phone]. This is his work number. Thank you

## 2015-08-14 LAB — MYASTHENIA GRAVIS PANEL 2
Acetylcholine Rec Binding: <0.3 nmol/L
Acetylcholine Rec Mod Ab: 6 % binding inhibition
Aceytlcholine Rec Bloc Ab: <15 % of inhibition (ref <15)

## 2015-08-16 NOTE — Telephone Encounter (Signed)
There isn't anything else.  It may just take some time to resolve, such as weeks to months (but usually it does get better)

## 2015-08-16 NOTE — Telephone Encounter (Signed)
I Faraz't think there is any connection with valacyclovir.  I would discuss with the eye doctor any further management regarding restarting it.

## 2015-08-16 NOTE — Telephone Encounter (Signed)
Pt aware.

## 2015-08-16 NOTE — Telephone Encounter (Signed)
-----   Message from Drema DallasAdam R Jaffe, DO sent at 08/15/2015 10:46 AM EDT ----- Myasthenia panel is negative

## 2015-08-16 NOTE — Telephone Encounter (Signed)
Message relayed to patient. Verbalized understanding. Did have a questions. States that he had been taking  valacyclovir 1000 mg (prescribed by eye doctor), up until this happened, but stopped; was afraid that it may have been contributing to issue. Do you think pt should restart? Do you think it played a part in pt's issue?  Pt would also like to know if you have any other ideas or avenues that he needs to pursue? Please advise.

## 2015-08-16 NOTE — Telephone Encounter (Signed)
Pt would also like to know if you have any other ideas or avenues that he needs to pursue in regards to pt's Cranial palsy? Please advise.

## 2015-08-16 NOTE — Telephone Encounter (Signed)
-----   Message from Drema DallasAdam R Jaffe, DO sent at 08/15/2015 10:44 AM EDT ----- Lyme testing is negative

## 2015-09-02 ENCOUNTER — Ambulatory Visit (HOSPITAL_COMMUNITY)
Admission: RE | Admit: 2015-09-02 | Discharge: 2015-09-02 | Disposition: A | Discharge status: Home / Self Care | Payer: Medicare Other | Source: Ambulatory Visit | Attending: Nurse Practitioner | Admitting: Nurse Practitioner

## 2015-09-02 ENCOUNTER — Encounter (HOSPITAL_COMMUNITY): Payer: Self-pay | Admitting: Nurse Practitioner

## 2015-09-02 VITALS — BP 144/82 | HR 84 | Ht 71.0 in | Wt 186.0 lb

## 2015-09-02 DIAGNOSIS — Z87891 Personal history of nicotine dependence: Secondary | ICD-10-CM | POA: Insufficient documentation

## 2015-09-02 DIAGNOSIS — Z0189 Encounter for other specified special examinations: Secondary | ICD-10-CM | POA: Diagnosis not present

## 2015-09-02 DIAGNOSIS — Z7901 Long term (current) use of anticoagulants: Secondary | ICD-10-CM | POA: Insufficient documentation

## 2015-09-02 DIAGNOSIS — Z95818 Presence of other cardiac implants and grafts: Secondary | ICD-10-CM | POA: Insufficient documentation

## 2015-09-02 DIAGNOSIS — E785 Hyperlipidemia, unspecified: Secondary | ICD-10-CM | POA: Insufficient documentation

## 2015-09-02 DIAGNOSIS — I2510 Atherosclerotic heart disease of native coronary artery without angina pectoris: Secondary | ICD-10-CM | POA: Diagnosis not present

## 2015-09-02 DIAGNOSIS — I481 Persistent atrial fibrillation: Secondary | ICD-10-CM | POA: Insufficient documentation

## 2015-09-02 DIAGNOSIS — H5340 Unspecified visual field defects: Secondary | ICD-10-CM | POA: Insufficient documentation

## 2015-09-02 DIAGNOSIS — I4819 Other persistent atrial fibrillation: Secondary | ICD-10-CM

## 2015-09-03 ENCOUNTER — Encounter (HOSPITAL_COMMUNITY): Payer: Self-pay | Admitting: Nurse Practitioner

## 2015-09-03 NOTE — Progress Notes (Signed)
Patient ID: [name] [name], male   DOB: May 03, 1940, 75 y.o.   MRN: [id number] Patient ID: [name] [name], male   DOB: 1941-04-03, 75 y.o.   MRN: [id number]     Primary Care Physician: No PCP Per Patient Referring Physician: Dr. Michaela Corner is a 75 y.o. male with a h/o, CAD with hx of PCI of proximal LAD, persistent afib for evaluation in the afib office,4/6. He was recently seen by his ophthalmologist and was recommended to undergo cardiac revaluation. He had developed double vision and there was concern that he may have had an optical stroke. Symptoms of  elevated irregular heart noted since early March. MRI scheduled but has  not been done yet. He saw Dr. Excell Seltzer where EKG revealed afib with RVR with HR of 107 bpm. EF showed EF at 55-60% with no cardiac source of emboli. He was started on xarelto. He has been seen by neurology.  He is in the afib clinic today with Dr. Earmon Phoenix plan to be cardioverted after anticoagulation x 3 weeks. Metoprolol was started and rate controlled. Pt states that the afib doesn't really bother him and shows some hesitation to have cardioversion at this time. He warts to get a few more things done and return in one month to further discuss. He tells me about researching afib and reading a book from Dr. Lamont Snowball, written in 2014, cardiologist out of [facility]  that talks about earthing or grounding to help restore SR. 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  He is being compliant with xarelto and has been on samples and stated that xarelto is going to cost over $300 dollars and will give 30 free day coupon and he was instructed to ask pharmacist if PA is needed and to fax form her if that is the case.  He returns to afib clinc 5/4. He had full neuro w/u by Dr. Everlena Cooper with  the conclusion although cannot r/o CVA in setting of newly diagnosed afi, he is favoring Left cranial VI palsy. This will take time to resolve but vision issues should improve. In the mean time, pt states that he is not that symptomatic with afib. Mostly noticed some fatigue. He has been on his blood thinner long enough to schedule cardioversion, but he is still not wanting to do this. He states that he has done his own reading and does not feel this is  the best approach. He rather wait to see if hie vision improves before doing anything else to correct heart rate. He is content for now to stay rate controlled.He would prefer to see Dr. Excell Seltzer in 4-6 months and discuss further at that time.  Today, he denies symptoms of palpitations, chest pain, shortness of breath, orthopnea, PND, lower extremity edema, dizziness, presyncope, syncope, or neurologic sequela. The patient is tolerating medications without difficulties and is otherwise without complaint today.   Past Medical History  Diagnosis Date  . History of tobacco abuse   . Hyperlipidemia   . FH: CAD (coronary artery disease)   . Chest pain   . CAD (coronary artery disease)    Past Surgical History  Procedure Laterality Date  . Cardiac catheterization    . Coronary stent placement      stents of the proximal lad  with bare metal stent  . Percutaneous coronary stent intervention (pci-s)      Current Outpatient Prescriptions  Medication Sig Dispense Refill  . Ascorbic Acid (VITAMIN C PO) Take 1 tablet by mouth daily.    . Cholecalciferol (VITAMIN D3) 3000 units TABS Take 1 tablet by mouth daily.    . Coenzyme Q10 (CO Q 10 PO) Take 1 capsule by mouth daily.    Marland Kitchen. MAGNESIUM PO Take 1 tablet by mouth daily.    . Menaquinone-7 (VITAMIN K2 PO) Take 1 tablet by mouth daily.    . metoprolol succinate (TOPROL-XL) 25 MG 24 hr tablet Take 1 tablet (25 mg total) by mouth at bedtime. 30 tablet 3  . Probiotic Product (PROBIOTIC ADVANCED PO) Take by  mouth.    . rivaroxaban (XARELTO) 20 MG TABS tablet Take 1 tablet (20 mg total) by mouth daily with supper. 30 tablet 0   No current facility-administered medications for this encounter.    No Known Allergies  Social History   Social History  . Marital Status: Married    Spouse Name: N/A  . Number of Children: N/A  . Years of Education: N/A   Occupational History  . Not on file.   Social History Main Topics  . Smoking status: Former Smoker    Quit date: 05/10/1978  . Smokeless tobacco: Never Used  . Alcohol Use: No  . Drug Use: No  . Sexual Activity: Not on file   Other Topics Concern  . Not on file   Social History Narrative    Family History  Problem Relation Age of Onset  . Cancer - Colon Mother   . Heart attack Father   . Hypertension Father     ROS- All systems are reviewed and negative except as per the HPI above  Physical Exam: Filed Vitals:   09/02/15 1524  BP: 144/82  Pulse: 84  Height: 5\' 11"  (1.803 m)  Weight: 186 lb (84.369 kg)    GEN- The patient is well appearing, alert and oriented x 3 today.   Head- normocephalic, atraumatic Eyes-  Sclera clear, conjunctiva pink Ears- hearing intact Oropharynx- clear Neck- supple, no JVP Lymph- no cervical lymphadenopathy Lungs- Clear to ausculation bilaterally, normal work of breathing Heart- Irregular rate and rhythm, no murmurs, rubs or gallops, PMI not laterally displaced GI- soft, NT, ND, + BS Extremities- no clubbing, cyanosis, or edema MS- no significant deformity or atrophy Skin- no rash or lesion Psych- euthymic mood, full affect Neuro- strength and sensation are intact  EKG- afib  V rate 84 bpm, QRS int 106 ms, Qtc 467 ms Epic records reviewed  Assessment and Plan: 1. Persistent asymptomatic afib Pt defers cardioversion at this time He is rate controlled and tolerating I feel that he really wants more time to try some of the earthing/grounding suggestions that he found thru internet  research mentioned in HPI I told him I had no knowledge of this and was  skeptical of theory He was also told the longer he is in afib,  diminishes abliity to restore rhythm He continues on xarelto, understands importance He would like to discuss further with Dr. Excell Seltzerooper in 4-6 months  2. Visual field defect Neuro w/u by Dr. Everlena CooperJaffe He is favoring left cranial VI palsy, but cannot fully r/o cardioembolic event in the setting of newly diagnosed afib. F/u pending in July  Afib clinic as needed as pt does not desire to treat afib any further at this point.  Elvina Sidleonna C.  [facility] [address] [place], [place] [address] [phone]  He will return in one month and discuss DCCV further.

## 2015-11-08 ENCOUNTER — Other Ambulatory Visit: Payer: Self-pay

## 2015-11-08 DIAGNOSIS — I4891 Unspecified atrial fibrillation: Secondary | ICD-10-CM

## 2015-11-08 MED ORDER — METOPROLOL SUCCINATE ER 25 MG PO TB24: 25.0000 mg | ORAL_TABLET | Freq: Every day | ORAL | Status: DC

## 2015-11-08 NOTE — Telephone Encounter (Signed)
Newman Niponna C Carroll, NP at 09/02/2015 11:59 PM  metoprolol succinate (TOPROL-XL) 25 MG 24 hr tabletTake 1 tablet (25 mg total) by mouth at bedtime  Tonny BollmanMichael Cooper, MD at 07/09/2015 11:45 AM  3. HTN - start metoprolol 2. START Metoprolol Succinate 25mg  take one tablet by mouth daily at bedtime

## 2015-11-18 ENCOUNTER — Ambulatory Visit: Payer: Medicare Other | Admitting: Neurology

## 2015-11-18 DIAGNOSIS — Z029 Encounter for administrative examinations, unspecified: Secondary | ICD-10-CM

## 2015-12-10 ENCOUNTER — Other Ambulatory Visit: Payer: Self-pay | Admitting: Cardiovascular Disease

## 2015-12-10 DIAGNOSIS — I4891 Unspecified atrial fibrillation: Secondary | ICD-10-CM

## 2016-03-11 ENCOUNTER — Other Ambulatory Visit: Payer: Self-pay | Admitting: Nurse Practitioner

## 2016-03-11 DIAGNOSIS — I4891 Unspecified atrial fibrillation: Secondary | ICD-10-CM

## 2016-04-12 DIAGNOSIS — H2513 Age-related nuclear cataract, bilateral: Secondary | ICD-10-CM | POA: Diagnosis not present

## 2016-07-12 ENCOUNTER — Other Ambulatory Visit: Payer: Self-pay | Admitting: Cardiovascular Disease

## 2016-07-12 ENCOUNTER — Other Ambulatory Visit: Payer: Self-pay | Admitting: Nurse Practitioner

## 2016-07-12 ENCOUNTER — Other Ambulatory Visit: Payer: Self-pay | Admitting: *Deleted

## 2016-07-12 DIAGNOSIS — I4891 Unspecified atrial fibrillation: Secondary | ICD-10-CM

## 2016-07-12 NOTE — Telephone Encounter (Signed)
Age 76 Wt 84.4kg (1940-10-11) Saw Rudi CocoDonna Carroll (09/02/2015)  Saw Dr Excell Seltzerooper (07/09/2015) 07/09/2015 SrCr 1.23 Hgb 14.5 HCT 44.2 CrCl 61.94  Refill done for Xarelto 20 mg daily as requested

## 2016-08-11 ENCOUNTER — Other Ambulatory Visit: Payer: Self-pay | Admitting: Cardiovascular Disease

## 2016-08-11 DIAGNOSIS — I4891 Unspecified atrial fibrillation: Secondary | ICD-10-CM

## 2016-08-31 ENCOUNTER — Telehealth: Payer: Self-pay | Admitting: Cardiovascular Disease

## 2016-08-31 DIAGNOSIS — I4891 Unspecified atrial fibrillation: Secondary | ICD-10-CM

## 2016-08-31 MED ORDER — METOPROLOL SUCCINATE ER 25 MG PO TB24: 25.0000 mg | ORAL_TABLET | Freq: Every day | ORAL | 0 refills | Status: DC

## 2016-08-31 NOTE — Telephone Encounter (Signed)
New message     *STAT* If patient is at the pharmacy, call can be transferred to refill team.   1. Which medications need to be refilled? (please list name of each medication and dose if known)metoprolol succinate (TOPROL-XL) 25 MG 24 hr tablet  2. Which pharmacy/location (including street and city if local pharmacy) is medication to be sent to? [facility]   3. Do they need a 30 day or 90 day supply? 30 days supply

## 2016-08-31 NOTE — Telephone Encounter (Signed)
metoprolol succinate (TOPROL-XL) 25 MG 24 hr tablet  Medication  Date: 08/11/2016 [facility] [address] Ordering/Authorizing: Tonny BollmanMichael Cooper, MD  Order Providers   Prescribing Provider Encounter Provider  Tonny BollmanMichael Cooper, MD Tonny BollmanMichael Cooper, MD  Medication Detail    Disp Refills Start End   metoprolol succinate (TOPROL-XL) 25 MG 24 hr tablet 15 tablet 0 08/11/2016    Sig - Route: Take 1 tablet (25 mg total) by mouth at bedtime. *Patient is overdue for an appointment and must call and schedule for further refills* - Oral   E-Prescribing Status: Receipt confirmed by pharmacy (08/11/2016 2:30 PM EDT)   Associated Diagnoses   Atrial fibrillation, unspecified type [facility](HCC)     [facility] [place] - [id number]

## 2016-09-04 DIAGNOSIS — I2510 Atherosclerotic heart disease of native coronary artery without angina pectoris: Secondary | ICD-10-CM | POA: Insufficient documentation

## 2016-09-04 DIAGNOSIS — I4819 Other persistent atrial fibrillation: Secondary | ICD-10-CM | POA: Insufficient documentation

## 2016-09-04 NOTE — Progress Notes (Signed)
Cardiology Office Note:    Date:  09/05/2016   ID:  [name] Griton Cuello, DOB 12-Jun-1940, MRN [id number]  PCP:  Patient, No Pcp Per  Cardiologist:  Dr. Tonny BollmanMichael Cooper    Referring MD: No ref. provider found   Chief Complaint  Patient presents with  . Atrial Fibrillation    Follow up    History of Present Illness:    [name] [name] is a 76 y.o. male with a hx of CAD s/p PCI with BMS to the LAD in 2006, HL, persistent AF. He was started on anticoagulation with Xarelto in 4/17 after AF was dx.  He had visual field defect and saw Neuro.  MRI was neg and it was felt that the patient had Cranial Nerve VI palsy.  Last seen in the AF clinic in 5/17.  He deferred DCCV at that time.  He returns for follow up.  He is here alone.  Since last seen, he denies chest pain, syncope, orthopnea, PND or significant pedal edema.  He does get short of breath with more extreme activities.  He walks daily without difficulty.    Prior CV studies:   The following studies were reviewed today:  Carotid US 4/17 < 50% bilat ICA  Echo 3/17 Mod LVH, EF 55-60, no RWMA, mild AI, mild to mod RAE  LHC 11/06 LAD prox 90, D1 prox 30, D2 ostial 25 and inf br 60; septal perf 2 ostial 95 MOM prox 25 RCA 25; PL2 ostial 95 EF 65 PCI: BMS to pLAD  Past Medical History:  Diagnosis Date  . CAD (coronary artery disease)   . Chest pain   . FH: CAD (coronary artery disease)   . History of tobacco abuse   . Hyperlipidemia     Past Surgical History:  Procedure Laterality Date  . CARDIAC CATHETERIZATION    . CORONARY STENT PLACEMENT     stents of the proximal lad with bare metal stent  . PERCUTANEOUS CORONARY STENT INTERVENTION (PCI-S)      Current Medications: Current Meds  Medication Sig  . Ascorbic Acid (VITAMIN C PO) Take 1 tablet by mouth daily.  . Cholecalciferol (VITAMIN D3) 3000 units TABS Take 1 tablet by mouth daily.  . Coenzyme Q10 (CO Q 10 PO) Take 1 capsule by mouth daily.  Marland Kitchen. MAGNESIUM PO Take 1 tablet by  mouth daily.  . Menaquinone-7 (VITAMIN K2 PO) Take 1 tablet by mouth daily.  . metoprolol succinate (TOPROL-XL) 25 MG 24 hr tablet Take 1 tablet (25 mg total) by mouth at bedtime. PT MUST KEEP UP COMING 09/05/16 APPT FOR FURTHER REFILLS  . Probiotic Product (PROBIOTIC ADVANCED PO) Take 1 tablet by mouth daily.   [name]. XARELTO 20 MG TABS tablet take 1 tablet by mouth once daily with SUPPER     Allergies:   Patient has no known allergies.   Social History   Social History  . Marital status: Married    Spouse name: N/A  . Number of children: N/A  . Years of education: N/A   Social History Main Topics  . Smoking status: Former Smoker    Quit date: 05/10/1978  . Smokeless tobacco: Never Used  . Alcohol use No  . Drug use: No  . Sexual activity: Not Asked   Other Topics Concern  . None   Social History Narrative  . None     Family History  Problem Relation Age of Onset  . Cancer - Colon Mother   . Heart attack Father   .  Hypertension Father      ROS:   Please see the history of present illness.    Review of Systems  Constitution: Positive for weight gain.   All other systems reviewed and are negative.   EKGs/Labs/Other Test Reviewed:    EKG:  EKG is  ordered today.  The ekg ordered today demonstrates Atrial fibrillation, HR 77, normal axis  Recent Labs: No results found for requested labs within last 8760 hours.   Recent Lipid Panel    Component Value Date/Time   CHOL 202 (H) 08/02/2015 1055   TRIG 50.0 08/02/2015 1055   HDL 61.70 08/02/2015 1055   CHOLHDL 3 08/02/2015 1055   VLDL 10.0 08/02/2015 1055   LDLCALC 130 (H) 08/02/2015 1055   LDLDIRECT 190.0 01/23/2008 1110     Physical Exam:    VS:  BP (!) 180/60   Pulse 77   Ht 5\' 11"  (1.803 m)   Wt 192 lb (87.1 kg)   BMI 26.78 kg/m     Wt Readings from Last 3 Encounters:  09/05/16 192 lb (87.1 kg)  09/02/15 186 lb (84.4 kg)  08/05/15 185 lb 6.4 oz (84.1 kg)     Physical Exam  Constitutional: He is  oriented to person, place, and time. He appears well-developed and well-nourished. No distress.  HENT:  Head: Normocephalic and atraumatic.  Eyes: No scleral icterus.  Neck: Normal range of motion. No JVD present. Carotid bruit is not present.  Cardiovascular: Normal rate, regular rhythm, S1 normal and S2 normal.   No murmur heard. Pulmonary/Chest: Effort normal and breath sounds normal. He has no wheezes. He has no rhonchi. He has no rales.  Abdominal: Soft. There is no tenderness.  Musculoskeletal: He exhibits no edema.  Neurological: He is alert and oriented to person, place, and time.  Skin: Skin is warm and dry.  Psychiatric: He has a normal mood and affect.    ASSESSMENT:    1. Persistent atrial fibrillation (HCC)   2. Coronary artery disease involving native coronary artery of native heart without angina pectoris   3. Hyperlipidemia, unspecified hyperlipidemia type   4. Essential hypertension    PLAN:    In order of problems listed above:  1. Persistent atrial fibrillation (HCC) -  Rate controlled.  He is tolerating Xarelto.  CHADS2-VASc=4 (CAD, HTN, 76 yo).  He is not symptomatic and has deferred DCCV in the past.  I suspect rate control Rx is adequate for him.  Today, obtain Basic Metabolic Panel (BMET), CBC  2. Coronary artery disease involving native coronary artery of native heart without angina pectoris -  s/p BMS to LAD in 2006.  No angina.  He is not on ASA as he is on Xarelto.  He is intol to statins.    3. Hyperlipidemia, unspecified hyperlipidemia type - He is intol to statins. His last LDL in 3/17 was 130.  We discussed the IMPROVE-IT trial and potential benefits of Zetia. He is not interested in trying this now and will consider it.  4. HTN -  BP elevated.  He did have a high salt meal over the weekend.  He usually has optimal BPs at home.  I have asked him to monitor this at home and to send some readings after 2-3 weeks.   Dispo:  Return in about 6 months  (around 03/08/2017) for Routine Follow Up, w/ Dr. Excell Seltzer.   Medication Adjustments/Labs and Tests Ordered: Current medicines are reviewed at length with the patient today.  Concerns regarding  medicines are outlined above.  Orders/Tests:  Orders Placed This Encounter  Procedures  . Basic Metabolic Panel (BMET)  . CBC  . EKG 12-Lead   Medication changes: No orders of the defined types were placed in this encounter.  Signed, Tereso Newcomer, PA-C  09/05/2016 9:39 AM    [facility] [address] [place], [place], [place]  [address] Phone: [phone]; Fax: [phone]

## 2016-09-05 ENCOUNTER — Encounter: Payer: Self-pay | Admitting: Physician Assistant

## 2016-09-05 ENCOUNTER — Ambulatory Visit (INDEPENDENT_AMBULATORY_CARE_PROVIDER_SITE_OTHER): Payer: Medicare Other | Admitting: Physician Assistant

## 2016-09-05 ENCOUNTER — Telehealth: Payer: Self-pay | Admitting: *Deleted

## 2016-09-05 ENCOUNTER — Encounter (INDEPENDENT_AMBULATORY_CARE_PROVIDER_SITE_OTHER): Payer: Self-pay

## 2016-09-05 VITALS — BP 180/60 | HR 77 | Ht 71.0 in | Wt 192.0 lb

## 2016-09-05 DIAGNOSIS — I481 Persistent atrial fibrillation: Secondary | ICD-10-CM

## 2016-09-05 DIAGNOSIS — I4819 Other persistent atrial fibrillation: Secondary | ICD-10-CM

## 2016-09-05 DIAGNOSIS — E785 Hyperlipidemia, unspecified: Secondary | ICD-10-CM | POA: Diagnosis not present

## 2016-09-05 DIAGNOSIS — I10 Essential (primary) hypertension: Secondary | ICD-10-CM | POA: Diagnosis not present

## 2016-09-05 DIAGNOSIS — I2510 Atherosclerotic heart disease of native coronary artery without angina pectoris: Secondary | ICD-10-CM | POA: Diagnosis not present

## 2016-09-05 LAB — CBC
HEMOGLOBIN: 14.5 g/dL (ref 13.0–17.7)
Hematocrit: 43.2 % (ref 37.5–51.0)
MCH: 29.6 pg (ref 26.6–33.0)
MCHC: 33.6 g/dL (ref 31.5–35.7)
MCV: 88 fL (ref 79–97)
PLATELETS: 164 10*3/uL (ref 150–379)
RBC: 4.9 x10E6/uL (ref 4.14–5.80)
RDW: 14.5 % (ref 12.3–15.4)
WBC: 5 10*3/uL (ref 3.4–10.8)

## 2016-09-05 LAB — BASIC METABOLIC PANEL
BUN/Creatinine Ratio: 18 (ref 10–24)
BUN: 21 mg/dL (ref 8–27)
CO2: 24 mmol/L (ref 18–29)
Calcium: 9.2 mg/dL (ref 8.6–10.2)
Chloride: 101 mmol/L (ref 96–106)
Creatinine, Ser: 1.2 mg/dL (ref 0.76–1.27)
GFR, EST AFRICAN AMERICAN: 68 mL/min/{1.73_m2} (ref >59)
GFR, EST NON AFRICAN AMERICAN: 59 mL/min/{1.73_m2} — AB (ref >59)
Glucose: 91 mg/dL (ref 65–99)
POTASSIUM: 4.4 mmol/L (ref 3.5–5.2)
SODIUM: 140 mmol/L (ref 134–144)

## 2016-09-05 NOTE — Telephone Encounter (Signed)
Pt notified of lab results by phone with verbal understanding to results given today. Pt thanked me for my call today.

## 2016-09-05 NOTE — Patient Instructions (Addendum)
Medication Instructions:  Your physician recommends that you continue on your current medications as directed. Please refer to the Current Medication list given to you today.  Labwork: TODAY BMET, CBC  Testing/Procedures: NONE ORDERED  Follow-Up: Your physician wants you to follow-up in: 6 MONTHS WITH DR. Theodoro ParmaOOPER You will receive a reminder letter in the mail two months in advance. If you Reshawn't receive a letter, please call our office to schedule the follow-up appointment.  Any Other Special Instructions Will Be Listed Below (If Applicable). MONITOR BLOOD PRESSURE AND SEND READINGS TO SCOTT WEAVER, PAC IN 2-3 WEEKS   If you need a refill on your cardiac medications before your next appointment, please call your pharmacy.

## 2016-09-05 NOTE — Telephone Encounter (Signed)
-----   Message from Beatrice LecherScott T Weaver, [place]PA-C sent at 09/05/2016  3:48 PM EDT ----- Please call the patient Kidney function is stable. The hemoglobin is normal. Continue with current treatment plan. Tereso NewcomerScott Weaver, PA-C   09/05/2016 3:48 PM

## 2016-09-07 ENCOUNTER — Other Ambulatory Visit: Payer: Self-pay | Admitting: Cardiovascular Disease

## 2016-09-07 DIAGNOSIS — I4891 Unspecified atrial fibrillation: Secondary | ICD-10-CM

## 2017-01-07 ENCOUNTER — Other Ambulatory Visit: Payer: Self-pay | Admitting: Cardiovascular Disease

## 2017-01-07 DIAGNOSIS — I4891 Unspecified atrial fibrillation: Secondary | ICD-10-CM

## 2017-01-26 ENCOUNTER — Encounter: Payer: Self-pay | Admitting: Cardiovascular Disease

## 2017-02-08 ENCOUNTER — Encounter: Payer: Self-pay | Admitting: Cardiovascular Disease

## 2017-02-08 ENCOUNTER — Ambulatory Visit (INDEPENDENT_AMBULATORY_CARE_PROVIDER_SITE_OTHER): Payer: Medicare Other | Admitting: Cardiovascular Disease

## 2017-02-08 VITALS — BP 152/90 | HR 88 | Ht 71.0 in | Wt 186.1 lb

## 2017-02-08 DIAGNOSIS — I481 Persistent atrial fibrillation: Secondary | ICD-10-CM

## 2017-02-08 DIAGNOSIS — I4819 Other persistent atrial fibrillation: Secondary | ICD-10-CM

## 2017-02-08 DIAGNOSIS — I2510 Atherosclerotic heart disease of native coronary artery without angina pectoris: Secondary | ICD-10-CM

## 2017-02-08 NOTE — Patient Instructions (Addendum)
Medication Instructions:  Your provider recommends that you continue on your current medications as directed. Please refer to the Current Medication list given to you today.    Labwork: None  Testing/Procedures: None   Follow-Up: Your provider wants you to follow-up in: 6 months with Scott Weaver, PA. You will receive a reminder letter in the mail two months in advance. If you Trevone't receive a letter, please call our office to schedule the follow-up appointment.    Your provider wants you to follow-up in: 1 year with Dr. Cooper. You will receive a reminder letter in the mail two months in advance. If you Hisashi't receive a letter, please call our office to schedule the follow-up appointment.    Any Other Special Instructions Will Be Listed Below (If Applicable).     If you need a refill on your cardiac medications before your next appointment, please call your pharmacy.   

## 2017-02-08 NOTE — Progress Notes (Signed)
Cardiology Office Note Date:  02/08/2017   ID:  [name] [name], DOB 08-Nov-1940, MRN [id number]  PCP:  Patient, No Pcp Per  Cardiologist:  Tonny Bollman, MD    Chief Complaint  Patient presents with  . Follow-up     History of Present Illness: [name] [name] is a 76 y.o. male who presents for follow-up of persistent atrial fibrillation. He's been managed with Xarelto for anticoagulation and metoprolol succinate for heart rate control.   He is here alone today. Feeling fine. Today, he denies symptoms of palpitations, chest pain, shortness of breath, orthopnea, PND, lower extremity edema, dizziness, or syncope.   Past Medical History:  Diagnosis Date  . CAD (coronary artery disease)   . Chest pain   . FH: CAD (coronary artery disease)   . History of tobacco abuse   . Hyperlipidemia     Past Surgical History:  Procedure Laterality Date  . CARDIAC CATHETERIZATION    . CORONARY STENT PLACEMENT     stents of the proximal lad with bare metal stent  . PERCUTANEOUS CORONARY STENT INTERVENTION (PCI-S)      Current Outpatient Prescriptions  Medication Sig Dispense Refill  . Ascorbic Acid (VITAMIN C PO) Take 1 tablet by mouth daily.    . Cholecalciferol (VITAMIN D3) 3000 units TABS Take 1 tablet by mouth daily.    . Coenzyme Q10 (CO Q 10 PO) Take 1 capsule by mouth daily.    Marland Kitchen MAGNESIUM PO Take 1 tablet by mouth daily.    . Menaquinone-7 (VITAMIN K2 PO) Take 1 tablet by mouth daily.    . metoprolol succinate (TOPROL-XL) 25 MG 24 hr tablet Take 1 tablet (25 mg total) by mouth at bedtime. 30 tablet 11  . Probiotic Product (PROBIOTIC ADVANCED PO) Take 1 tablet by mouth daily.     [name] 20 MG TABS tablet take 1 tablet by mouth once daily WITH SUPPER 30 tablet 3   No current facility-administered medications for this visit.     Allergies:   Patient has no known allergies.   Social History:  The patient  reports that he quit smoking about 38 years ago. He has never used smokeless  tobacco. He reports that he does not drink alcohol or use drugs.   Family History:  The patient's  family history includes Cancer - Colon in his mother; Heart attack in his father; Hypertension in his father.    ROS:  Please see the history of present illness.  Otherwise, review of systems is positive for rash.  All other systems are reviewed and negative.    PHYSICAL EXAM: VS:  BP (!) 152/90   Pulse 88   Ht  (1.803 m)   Wt 186 lb 1.9 oz (84.4 kg)   BMI 25.96 kg/m  , BMI Body mass index is 25.96 kg/m. GEN: Well nourished, well developed, in no acute distress  HEENT: normal  Neck: no JVD, no masses. No carotid bruits Cardiac: irregularly irregular without murmur or gallop                Respiratory:  clear to auscultation bilaterally, normal work of breathing GI: soft, nontender, nondistended, + BS MS: no deformity or atrophy  Ext: 1+ bilateral pretibial edema Skin: erythematous, scaling rash both lower legs and left wrist Neuro:  Strength and sensation are intact Psych: euthymic mood, full affect  EKG:  EKG is ordered today. The ekg ordered today shows atrial fibrillation 88 bpm, mild QT prolongation, otherwise normal  Recent Labs: 09/05/2016: BUN 21; Creatinine, Ser 1.20; Hemoglobin 14.5; Platelets 164; Potassium 4.4; Sodium 140   Lipid Panel     Component Value Date/Time   CHOL 202 (H) 08/02/2015 1055   TRIG 50.0 08/02/2015 1055   HDL 61.70 08/02/2015 1055   CHOLHDL 3 08/02/2015 1055   VLDL 10.0 08/02/2015 1055   LDLCALC 130 (H) 08/02/2015 1055   LDLDIRECT 190.0 01/23/2008 1110      Wt Readings from Last 3 Encounters:  02/08/17 186 lb 1.9 oz (84.4 kg)  09/05/16 192 lb (87.1 kg)  09/02/15 186 lb (84.4 kg)     Cardiac Studies Reviewed: Echo 07/23/2015: Study Conclusions  - Left ventricle: The cavity size was normal. Wall thickness was   increased in a pattern of moderate LVH. Systolic function was   normal. The estimated ejection fraction was in the  range of 55%   to 60%. Wall motion was normal; there were no regional wall   motion abnormalities. - Aortic valve: There was mild regurgitation. - Right atrium: The atrium was mildly to moderately dilated.  ASSESSMENT AND PLAN: 1.  Persistent atrial fibrillation: continue metoprolol succinate for heart rate control and Xarelto for anticoagulation. The patient Toprol is causing his rash, which I think would be very unusual. The rash is not in a pattern of drug reaction/rash. I offered to change him to diltiazem but he declined.   2. HTN: BP elevated today. He states BP is in good range at home. Continue Toprol XL at current dose.   3. Hyperlipidemia: pt has declined lipid lowering therapies.  4. CAD, native vessel: no angina. No antiplatelet rx as his stent is remote and he is on chronic anticoagulation.   Lengthy discussion with patient today regarding vitamins, magnesium supplements and their role in treating patients with atrial fibrillation. He understands I Jaece't know of any data that these are helpful.   Current medicines are reviewed with the patient today.  The patient does not have concerns regarding medicines.  Labs/ tests ordered today include:  No orders of the defined types were placed in this encounter.   Disposition:   FU 6 months Tereso Newcomer, PA-C, one year with me  Signed, Tonny Bollman, MD  02/08/2017 3:49 PM    [facility] [address] [place], [place], [place]  [address] Phone: [phone]; Fax: [phone]

## 2017-02-09 ENCOUNTER — Other Ambulatory Visit: Payer: Self-pay

## 2017-02-09 DIAGNOSIS — I4819 Other persistent atrial fibrillation: Secondary | ICD-10-CM

## 2017-05-09 ENCOUNTER — Other Ambulatory Visit: Payer: Self-pay | Admitting: *Deleted

## 2017-05-09 DIAGNOSIS — I4891 Unspecified atrial fibrillation: Secondary | ICD-10-CM

## 2017-05-09 MED ORDER — RIVAROXABAN 20 MG PO TABS: ORAL_TABLET | ORAL | 5 refills | Status: DC

## 2017-05-09 NOTE — Telephone Encounter (Signed)
Pt last saw Dr Excell Seltzerooper 02/08/17, last labs 09/05/16 Creat 1.20, age 77, weight 84.4kg, CrCl 62.52, based on CrCl pt is on appropriate dosage of Xarelto 20mg  QD.  Will refill rx.

## 2017-09-04 ENCOUNTER — Ambulatory Visit (INDEPENDENT_AMBULATORY_CARE_PROVIDER_SITE_OTHER): Payer: Medicare Other | Admitting: Physician Assistant

## 2017-09-04 ENCOUNTER — Encounter: Payer: Self-pay | Admitting: Physician Assistant

## 2017-09-04 VITALS — BP 142/80 | HR 61 | Ht 71.0 in | Wt 180.4 lb

## 2017-09-04 DIAGNOSIS — I2510 Atherosclerotic heart disease of native coronary artery without angina pectoris: Secondary | ICD-10-CM

## 2017-09-04 DIAGNOSIS — I481 Persistent atrial fibrillation: Secondary | ICD-10-CM | POA: Diagnosis not present

## 2017-09-04 DIAGNOSIS — E785 Hyperlipidemia, unspecified: Secondary | ICD-10-CM

## 2017-09-04 DIAGNOSIS — I4819 Other persistent atrial fibrillation: Secondary | ICD-10-CM

## 2017-09-04 NOTE — Progress Notes (Signed)
Cardiology Office Note:    Date:  09/04/2017   ID:  [name], DOB [birth year], MRN [id number]  PCP:  Patient, No Pcp Per  Cardiologist:  Tonny Bollman, MD   Referring MD: Beatrice Lecher, PA-C   Chief Complaint  Patient presents with  . Follow-up    CAD, AFib    History of Present Illness:    Asael Pann is a 77 y.o. male with coronary artery disease status post PCI with a bare-metal stent to the LAD in 2006, persistent atrial fibrillation, hyperlipidemia.  He has declined cardioversion in the past.  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  He was last seen in our office in October 2018 by Dr. Excell Seltzer.  Mr. Otterson returns for follow-up.  He is here alone.  He denies chest pain, shortness of breath, syncope, PND.  He has left lower extremity swelling that is fairly chronic without significant change.  He denies any significant bleeding issues.  He had a lot of questions regarding diet today.  He brought in the most recent article from the Journal of [employer] of Cardiology regarding increased cardiovascular risk in patients that skip breakfast.  Prior CV studies:   The following studies were reviewed today:  Carotid US 4/17 < 50% bilat ICA  Echo 3/17 Mod LVH, EF 55-60, no RWMA, mild AI, mild to mod RAE  LHC 11/06 LAD prox 90, D1 prox 30, D2 ostial 25 and inf br 60; septal perf 2 ostial 95 MOM prox 25 RCA 25; PL2 ostial 95 EF 65 PCI: BMS to pLAD  Past Medical History:  Diagnosis Date  . CAD (coronary artery disease)    LHC 11/06: pLAD 90, pD1 30, oD2 25 with inf branch 60; septal perf ostial 95; prox MOM 25, RCA 25, oPL2 95, EF 65 >> PCI:  BMS to proximal LAD   . FH: CAD (coronary artery disease)   . History of echocardiogram    Echo 3/17:  Mod LVH, EF 55-60, no RWMA, mild AI, mild to mod RAE  . History of tobacco abuse   . Hyperlipidemia   . Persistent atrial fibrillation (HCC)    Xarelto for a/c   Surgical Hx: The patient  has a past surgical history that includes Cardiac catheterization; Coronary stent placement; and Percutaneous coronary stent intervention (pci-s).   Current Medications: Current Meds  Medication Sig  . Ascorbic Acid (VITAMIN C PO) Take 1 tablet by mouth daily.  . Cholecalciferol (VITAMIN D3) 3000 units TABS Take 1 tablet by mouth daily.  . Coenzyme Q10 (CO Q 10 PO) Take 1 capsule by mouth daily.  Marland Kitchen MAGNESIUM PO Take 1 tablet by mouth daily.  . Menaquinone-7 (VITAMIN K2 PO) Take 1 tablet by mouth daily.  . metoprolol succinate (TOPROL-XL) 25 MG 24 hr tablet Take 1 tablet (25 mg total) by mouth at bedtime.  . Probiotic Product (PROBIOTIC ADVANCED PO) Take 1 tablet by mouth daily.      Allergies:   Patient has no known allergies.   Social History   Tobacco Use  . Smoking status: Former Smoker    Last attempt to quit: 05/10/1978    Years since quitting: 39.3  . Smokeless tobacco: Never Used  Substance Use Topics  . Alcohol use: No  . Drug use: No     Family Hx: The patient's family history includes Cancer - Colon in his mother; Heart attack  in his father; Hypertension in his father.  ROS:   Please see the history of present illness.    Review of Systems  Cardiovascular: Positive for palpitations.  Skin: Positive for rash.   All other systems reviewed and are negative.   EKGs/Labs/Other Test Reviewed:    EKG:  EKG is  ordered today.  The ekg ordered today demonstrates atrial fibrillation, HR 62  Recent Labs: 09/05/2016: BUN 21; Creatinine, Ser 1.20; Hemoglobin 14.5; Platelets 164; Potassium 4.4; Sodium 140   Recent Lipid Panel Lab Results  Component Value Date/Time   CHOL 202 (H) 08/02/2015 10:55 AM   TRIG 50.0 08/02/2015 10:55 AM   HDL 61.70 08/02/2015 10:55 AM   CHOLHDL 3 08/02/2015 10:55 AM   LDLCALC 130 (H) 08/02/2015 10:55 AM   LDLDIRECT 190.0 01/23/2008 11:10 AM    Physical Exam:    VS:  BP (!) 142/80 (BP  Location: Left Arm, Patient Position: Sitting, Cuff Size: Normal)   Pulse 61   Ht  (1.803 m)   Wt 180 lb 6.4 oz (81.8 kg)   SpO2 99%   BMI 25.16 kg/m     Wt Readings from Last 3 Encounters:  09/04/17 180 lb 6.4 oz (81.8 kg)  02/08/17 186 lb 1.9 oz (84.4 kg)  09/05/16 192 lb (87.1 kg)     Physical Exam  Constitutional: He is oriented to person, place, and time. He appears well-developed and well-nourished. No distress.  HENT:  Head: Normocephalic and atraumatic.  Neck: Neck supple. No JVD present.  Cardiovascular: Normal rate, S1 normal and S2 normal. An irregularly irregular rhythm present.  No murmur heard. Pulmonary/Chest: Breath sounds normal. He has no rales.  Abdominal: Soft. There is no hepatomegaly.  Musculoskeletal: He exhibits edema (1+ bilat ankle edema (L>R)).  Neurological: He is alert and oriented to person, place, and time.  Skin: Skin is warm and dry.    ASSESSMENT & PLAN:    Persistent atrial fibrillation (HCC) He is managed with a rate control strategy.  Continue current dose of metoprolol, rivaroxaban.  Obtain follow-up BMET, CBC today.  Coronary artery disease involving native coronary artery of native heart without angina pectoris History of bare-metal stent to the LAD in 2006.  He denies anginal symptoms.  He is not on aspirin as he is on rivaroxaban.  He is intolerant to statin therapy.  Hyperlipidemia, unspecified hyperlipidemia type We discussed the benefits of aggressive lipid management.  I have asked him to consider taking Zetia or a PCSK-9 inhibitor.  He will research this further and let me know.  Dispo:  Return in about 6 months (around 03/07/2018) for Routine Follow Up, w/ Dr. Excell Seltzer.   Medication Adjustments/Labs and Tests Ordered: Current medicines are reviewed at length with the patient today.  Concerns regarding medicines are outlined above.  Tests Ordered: Orders Placed This Encounter  Procedures  . CBC  . Basic metabolic panel    . EKG 12-Lead   Medication Changes: No orders of the defined types were placed in this encounter.   Signed, Tereso Newcomer, PA-C  09/04/2017 2:44 PM    [facility] [address] [place], [place], [place]  [address] Phone: [phone]; Fax: [phone]

## 2017-09-04 NOTE — Patient Instructions (Signed)
Medication Instructions:  No changes  Labwork: Today - BMET, CBC  Testing/Procedures: None   Follow-Up: Tonny Bollman, MD in 6 months.   Any Other Special Instructions Will Be Listed Below (If Applicable). The other medications for cholesterol are: Zetia (Ezetimibe) PCSK-9 inhibitors (Repatha or Praluent) If you would like to try one of these, let me know.  If you need a refill on your cardiac medications before your next appointment, please call your pharmacy.

## 2017-09-05 LAB — CBC
HEMATOCRIT: 44.5 % (ref 37.5–51.0)
Hemoglobin: 14 g/dL (ref 13.0–17.7)
MCH: 28.3 pg (ref 26.6–33.0)
MCHC: 31.5 g/dL (ref 31.5–35.7)
MCV: 90 fL (ref 79–97)
Platelets: 146 10*3/uL — ABNORMAL LOW (ref 150–379)
RBC: 4.95 x10E6/uL (ref 4.14–5.80)
RDW: 16.3 % — AB (ref 12.3–15.4)
WBC: 4.7 10*3/uL (ref 3.4–10.8)

## 2017-09-05 LAB — BASIC METABOLIC PANEL
BUN / CREAT RATIO: 13 (ref 10–24)
BUN: 16 mg/dL (ref 8–27)
CO2: 28 mmol/L (ref 20–29)
Calcium: 9.6 mg/dL (ref 8.6–10.2)
Chloride: 101 mmol/L (ref 96–106)
Creatinine, Ser: 1.23 mg/dL (ref 0.76–1.27)
GFR calc Af Amer: 66 mL/min/{1.73_m2} (ref >59)
GFR, EST NON AFRICAN AMERICAN: 57 mL/min/{1.73_m2} — AB (ref >59)
GLUCOSE: 72 mg/dL (ref 65–99)
POTASSIUM: 4.4 mmol/L (ref 3.5–5.2)
SODIUM: 141 mmol/L (ref 134–144)

## 2017-09-14 ENCOUNTER — Other Ambulatory Visit: Payer: Self-pay | Admitting: Cardiovascular Disease

## 2017-09-14 DIAGNOSIS — I4891 Unspecified atrial fibrillation: Secondary | ICD-10-CM

## 2017-10-19 ENCOUNTER — Other Ambulatory Visit: Payer: Self-pay | Admitting: Cardiovascular Disease

## 2017-10-19 DIAGNOSIS — I4891 Unspecified atrial fibrillation: Secondary | ICD-10-CM

## 2017-10-19 NOTE — Telephone Encounter (Signed)
Pt is a 77 yr old male who saw PA, Weaver on 09/04/17. Weight at that visit was  81.8Kg. SCr  On 09/04/17 was 1.23. CrCl is 58 mL/min. Will refill Xarelto 20mg  QD.

## 2018-05-09 NOTE — Progress Notes (Signed)
Cardiology Office Note:    Date:  05/10/2018   ID:  [name] [name], DOB 21-Dec-1940, MRN [id number]  PCP:  Doristine Bosworth, MD  Cardiologist:  Tonny Bollman, MD   Electrophysiologist:  None   Referring MD: No ref. provider found   Chief Complaint  Patient presents with  . Follow-up    AFib, CAD     History of Present Illness:    [name] [name] is a 78 y.o. male with coronary artery disease status post PCI with a bare-metal stent to the LAD in 2006, persistent atrial fibrillation, hyperlipidemia.  He has declined cardioversion in the past.  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  He was last seen in 08/2017.      [name] [name] returns for follow up. He is here alone.  He has not had any chest pain, shortness of breath, syncope, paroxysmal nocturnal dyspnea.  He has some leg swelling that is worse on the L at times.  He has not had any bleeding issues.  He exercises on a regular basis.    Prior CV studies:   The following studies were reviewed today:  Carotid US 4/17 < 50% bilat ICA  Echo 3/17 Mod LVH, EF 55-60, no RWMA, mild AI, mild to mod RAE  LHC 11/06 LAD prox 90, D1 prox 30, D2 ostial 25 and inf br 60; septal perf 2 ostial 95 MOM prox 25 RCA 25; PL2 ostial 95 EF 65 PCI: BMS to pLAD  Past Medical History:  Diagnosis Date  . CAD (coronary artery disease)    LHC 11/06: pLAD 90, pD1 30, oD2 25 with inf branch 60; septal perf ostial 95; prox MOM 25, RCA 25, oPL2 95, EF 65 >> PCI:  BMS to proximal LAD   . FH: CAD (coronary artery disease)   . History of echocardiogram    Echo 3/17:  Mod LVH, EF 55-60, no RWMA, mild AI, mild to mod RAE  . History of tobacco abuse   . Hyperlipidemia   . Persistent atrial fibrillation    Xarelto for a/c   Surgical Hx: The patient  has a past surgical history that includes Cardiac catheterization; Coronary stent placement; and Percutaneous coronary stent intervention  (pci-s).   Current Medications: Current Meds  Medication Sig  . Ascorbic Acid (VITAMIN C PO) Take 1 tablet by mouth daily.  . Coenzyme Q10 (CO Q 10 PO) Take 1 capsule by mouth daily.  Marland Kitchen MAGNESIUM PO Take 1 tablet by mouth daily.  . Menaquinone-7 (VITAMIN K2 PO) Take 1 tablet by mouth daily.  . metoprolol succinate (TOPROL-XL) 25 MG 24 hr tablet TAKE 1 TABLET BY MOUTH AT BEDTIME  . Probiotic Product (PROBIOTIC ADVANCED PO) Take 1 tablet by mouth daily.   [name] [name] 20 MG TABS tablet TAKE 1 TABLET BY MOUTH ONCE DAILY WITH SUPPER     Allergies:   Patient has no known allergies.   Social History   Tobacco Use  . Smoking status: Former Smoker    Last attempt to quit: 05/10/1978    Years since quitting: 40.0  . Smokeless tobacco: Never Used  Substance Use Topics  . Alcohol use: No  . Drug use: No     Family Hx: The patient's family history includes Cancer - Colon in his mother; Heart attack in his father; Hypertension in his father.  ROS:   Please see the history of present illness.  ROS All other systems reviewed and are negative.   EKGs/Labs/Other Test Reviewed:    EKG:  EKG is  ordered today.  The ekg ordered today demonstrates AFib, HR 69, leftward axis, LVH, QTc 439  Recent Labs: 09/04/2017: BUN 16; Creatinine, Ser 1.23; Hemoglobin 14.0; Platelets 146; Potassium 4.4; Sodium 141   Recent Lipid Panel Lab Results  Component Value Date/Time   CHOL 202 (H) 08/02/2015 10:55 AM   TRIG 50.0 08/02/2015 10:55 AM   HDL 61.70 08/02/2015 10:55 AM   CHOLHDL 3 08/02/2015 10:55 AM   LDLCALC 130 (H) 08/02/2015 10:55 AM   LDLDIRECT 190.0 01/23/2008 11:10 AM    Physical Exam:    VS:  BP 136/80   Pulse 69   Ht 5\' 11"  (1.803 m)   Wt 183 lb 6.4 oz (83.2 kg)   SpO2 97%   BMI 25.58 kg/m     Wt Readings from Last 3 Encounters:  05/10/18 183 lb 6.4 oz (83.2 kg)  09/04/17 180 lb 6.4 oz (81.8 kg)  02/08/17 186 lb 1.9 oz (84.4 kg)     Physical Exam  Constitutional: He is  oriented to person, place, and time. He appears well-developed and well-nourished. No distress.  HENT:  Head: Normocephalic and atraumatic.  Neck: Neck supple. No JVD present.  Cardiovascular: Normal rate, S1 normal and S2 normal. An irregularly irregular rhythm present.  No murmur heard. Pulmonary/Chest: Breath sounds normal. He has no rales.  Abdominal: Soft. There is no hepatomegaly.  Musculoskeletal:        General: Edema (trace bilat LE edema) present.  Neurological: He is alert and oriented to person, place, and time.  Skin: Skin is warm and dry.    ASSESSMENT & PLAN:    Persistent atrial fibrillation Rate controlled.  Continue current dose of Rivaroxaban.  Plan repeat BMET, CBC at follow up in 6 mos.   Coronary artery disease involving native coronary artery of native heart without angina pectoris Hx of BMS to LAD in 2006.  He remains active and exercises on a regular basis.  He is not on ASA as he is on Rivaroxaban.  Continue beta-blocker.  He is intol of statins and has declined alternative Rx for cholesterol in the past.   Essential hypertension  Borderline control.  Continue current Rx and continue to monitor.    Dispo:  Return in about 6 months (around 11/08/2018) for Routine Follow Up, w/ Dr. Excell Seltzer, or Tereso Newcomer, PA-C.   Medication Adjustments/Labs and Tests Ordered: Current medicines are reviewed at length with the patient today.  Concerns regarding medicines are outlined above.  Tests Ordered: Orders Placed This Encounter  Procedures  . Basic metabolic panel  . CBC  . EKG 12-Lead   Medication Changes: No orders of the defined types were placed in this encounter.   Signed, Tereso Newcomer, PA-C  05/10/2018 1:08 PM    [facility] [address] [place], [place], [place]  [address] Phone: [phone]; Fax: [phone]

## 2018-05-10 ENCOUNTER — Ambulatory Visit (INDEPENDENT_AMBULATORY_CARE_PROVIDER_SITE_OTHER): Payer: Medicare Other | Admitting: Physician Assistant

## 2018-05-10 ENCOUNTER — Encounter: Payer: Self-pay | Admitting: Physician Assistant

## 2018-05-10 VITALS — BP 136/80 | HR 69 | Ht 71.0 in | Wt 183.4 lb

## 2018-05-10 DIAGNOSIS — I2510 Atherosclerotic heart disease of native coronary artery without angina pectoris: Secondary | ICD-10-CM | POA: Diagnosis not present

## 2018-05-10 DIAGNOSIS — I10 Essential (primary) hypertension: Secondary | ICD-10-CM | POA: Diagnosis not present

## 2018-05-10 DIAGNOSIS — I4819 Other persistent atrial fibrillation: Secondary | ICD-10-CM

## 2018-05-10 NOTE — Patient Instructions (Signed)
Medication Instructions:  No changes.  Continue current medications.   If you need a refill on your cardiac medications before your next appointment, please call your pharmacy.   Lab work: None today.  We will get follow up labs at your next visit in 6 months.   If you have labs (blood work) drawn today and your tests are completely normal, you will receive your results only by: Marland Kitchen MyChart Message (if you have MyChart) OR . A paper copy in the mail If you have any lab test that is abnormal or we need to change your treatment, we will call you to review the results.  Testing/Procedures: None   Follow-Up: At [facility], you and your health needs are our priority.  As part of our continuing mission to provide you with exceptional heart care, we have created designated Provider Care Teams.  These Care Teams include your primary Cardiologist (physician) and Advanced Practice Providers (APPs -  Physician Assistants and Nurse Practitioners) who all work together to provide you with the care you need, when you need it. You will need a follow up appointment in:  6 months.  Please call our office 2 months in advance to schedule this appointment.  You may see Tonny Bollman, MD or one of the following Advanced Practice Providers on your designated Care Team: Tereso Newcomer, PA-C  Any Other Special Instructions Will Be Listed Below (If Applicable).

## 2018-09-16 ENCOUNTER — Other Ambulatory Visit: Payer: Self-pay | Admitting: Cardiovascular Disease

## 2018-09-16 DIAGNOSIS — I4891 Unspecified atrial fibrillation: Secondary | ICD-10-CM

## 2018-12-19 ENCOUNTER — Other Ambulatory Visit: Payer: Self-pay | Admitting: Cardiovascular Disease

## 2018-12-19 DIAGNOSIS — I4891 Unspecified atrial fibrillation: Secondary | ICD-10-CM

## 2018-12-19 NOTE — Telephone Encounter (Signed)
57m 83.2kg Scr 1.23 09/04/17 outdated ccr 58 mlmin Lovw/weaver 05/10/18  Will send one month with a note to obtain labs for further refills

## 2018-12-24 ENCOUNTER — Other Ambulatory Visit: Payer: Medicare Other

## 2018-12-24 ENCOUNTER — Other Ambulatory Visit: Payer: Self-pay

## 2018-12-24 DIAGNOSIS — I2510 Atherosclerotic heart disease of native coronary artery without angina pectoris: Secondary | ICD-10-CM | POA: Diagnosis not present

## 2018-12-24 DIAGNOSIS — I4819 Other persistent atrial fibrillation: Secondary | ICD-10-CM

## 2018-12-24 DIAGNOSIS — I10 Essential (primary) hypertension: Secondary | ICD-10-CM

## 2018-12-25 LAB — CBC
Hematocrit: 43.4 % (ref 37.5–51.0)
Hemoglobin: 14.5 g/dL (ref 13.0–17.7)
MCH: 29.9 pg (ref 26.6–33.0)
MCHC: 33.4 g/dL (ref 31.5–35.7)
MCV: 90 fL (ref 79–97)
Platelets: 154 10*3/uL (ref 150–450)
RBC: 4.85 x10E6/uL (ref 4.14–5.80)
RDW: 13.3 % (ref 11.6–15.4)
WBC: 6.1 10*3/uL (ref 3.4–10.8)

## 2018-12-25 LAB — BASIC METABOLIC PANEL
BUN/Creatinine Ratio: 30 — ABNORMAL HIGH (ref 10–24)
BUN: 35 mg/dL — ABNORMAL HIGH (ref 8–27)
CO2: 22 mmol/L (ref 20–29)
Calcium: 9.2 mg/dL (ref 8.6–10.2)
Chloride: 104 mmol/L (ref 96–106)
Creatinine, Ser: 1.15 mg/dL (ref 0.76–1.27)
GFR calc Af Amer: 70 mL/min/{1.73_m2} (ref >59)
GFR calc non Af Amer: 61 mL/min/{1.73_m2} (ref >59)
Glucose: 88 mg/dL (ref 65–99)
Potassium: 4.6 mmol/L (ref 3.5–5.2)
Sodium: 140 mmol/L (ref 134–144)

## 2019-01-02 ENCOUNTER — Ambulatory Visit (INDEPENDENT_AMBULATORY_CARE_PROVIDER_SITE_OTHER): Payer: Medicare Other | Admitting: Cardiovascular Disease

## 2019-01-02 ENCOUNTER — Other Ambulatory Visit: Payer: Self-pay

## 2019-01-02 ENCOUNTER — Encounter: Payer: Self-pay | Admitting: Cardiovascular Disease

## 2019-01-02 VITALS — BP 152/72 | HR 54 | Ht 71.0 in | Wt 178.0 lb

## 2019-01-02 DIAGNOSIS — I10 Essential (primary) hypertension: Secondary | ICD-10-CM

## 2019-01-02 DIAGNOSIS — I4819 Other persistent atrial fibrillation: Secondary | ICD-10-CM | POA: Diagnosis not present

## 2019-01-02 DIAGNOSIS — I2510 Atherosclerotic heart disease of native coronary artery without angina pectoris: Secondary | ICD-10-CM

## 2019-01-02 MED ORDER — DILTIAZEM HCL ER COATED BEADS 180 MG PO CP24: 180.0000 mg | ORAL_CAPSULE | Freq: Every day | ORAL | 3 refills | Status: DC

## 2019-01-02 NOTE — Patient Instructions (Signed)
Medication Instructions:  1) STOP TOPROL 2) START CARDIZEM 180 mg daily   Labwork: None  Testing/Procedures: None  Follow-Up: Your provider wants you to follow-up in: 1 year with Dr. Burt Knack. You will receive a reminder letter in the mail two months in advance. If you [name] [name]'t receive a letter, please call our office to schedule the follow-up appointment.

## 2019-01-02 NOTE — Progress Notes (Signed)
Cardiology Office Note:    Date:  01/10/2019   ID:  [name] [name], DOB 26-Mar-1941, MRN [id number]  PCP:  Doristine Bosworth, MD  Cardiologist:  Tonny Bollman, MD  Electrophysiologist:  None   Referring MD: Doristine Bosworth, MD   Chief Complaint  Patient presents with  . Atrial Fibrillation    History of Present Illness:    [name] [name] is a 78 y.o. male with a hx of longstanding persistent atrial fibrillation, presenting for follow-up evaluation.  The patient was last seen by Tereso Newcomer about 6 months ago.  He is chronically anticoagulated with rivaroxaban and heart rate is controlled with metoprolol succinate. He is here alone today. He complains of a rash on his arms that he thinks might be related to beta blocker Rx. Today, he denies symptoms of palpitations, chest pain, shortness of breath, orthopnea, PND, lower extremity edema, dizziness, or syncope.   Past Medical History:  Diagnosis Date  . CAD (coronary artery disease)    LHC 11/06: pLAD 90, pD1 30, oD2 25 with inf branch 60; septal perf ostial 95; prox MOM 25, RCA 25, oPL2 95, EF 65 >> PCI:  BMS to proximal LAD   . FH: CAD (coronary artery disease)   . History of echocardiogram    Echo 3/17:  Mod LVH, EF 55-60, no RWMA, mild AI, mild to mod RAE  . History of tobacco abuse   . Hyperlipidemia   . Persistent atrial fibrillation    Xarelto for a/c    Past Surgical History:  Procedure Laterality Date  . CARDIAC CATHETERIZATION    . CORONARY STENT PLACEMENT     stents of the proximal lad with bare metal stent  . PERCUTANEOUS CORONARY STENT INTERVENTION (PCI-S)      Current Medications: Current Meds  Medication Sig  . Ascorbic Acid (VITAMIN C PO) Take 1 tablet by mouth daily.  . Cholecalciferol (D3-50) 1.25 MG (50000 UT) capsule Take 50,000 Units by mouth daily.  . Cod Liver Oil OIL Take 1 Dose by mouth daily.  . Coenzyme Q10 (CO Q 10 PO) Take 1 capsule by mouth daily.  Marland Kitchen MAGNESIUM PO Take 1 tablet by mouth daily.  .  Menaquinone-7 (VITAMIN K2 PO) Take 1 tablet by mouth daily.  . Probiotic Product (PROBIOTIC ADVANCED PO) Take 1 tablet by mouth daily.   [name] 20 MG TABS tablet TAKE 1 TABLET BY MOUTH EVERY DAY WITH SUPPER  . [DISCONTINUED] metoprolol succinate (TOPROL-XL) 25 MG 24 hr tablet TAKE 1 TABLET BY MOUTH AT BEDTIME     Allergies:   Patient has no known allergies.   Social History   Socioeconomic History  . Marital status: Married    Spouse name: Not on file  . Number of children: Not on file  . Years of education: Not on file  . Highest education level: Not on file  Occupational History  . 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 Financial resource strain: Not on file  . Food insecurity    Worry: Not on file    Inability: Not on file  . Transportation needs    Medical: Not on file    Non-medical: Not on file  Tobacco Use  . Smoking status: Former Smoker    Quit date: 05/10/1978    Years since quitting: 40.6  . Smokeless tobacco: Never Used  Substance and Sexual Activity  . Alcohol use: No  . Drug use: No  . Sexual  activity: Not on file  Lifestyle  . Physical activity    Days per week: Not on file    Minutes per session: Not on file  . Stress: Not on file  Relationships  . Social Musicianconnections    Talks on phone: Not on file    Gets together: Not on file    Attends religious service: Not on file    Active member of club or organization: Not on file    Attends meetings of clubs or organizations: Not on file    Relationship status: Not on file  Other Topics Concern  . Not on file  Social History Narrative  . Not on file     Family History: The patient's family history includes Cancer - Colon in his mother; Heart attack in his father; Hypertension in his father.  ROS:   Please see the history of present illness.    All other systems reviewed and are negative.  EKGs/Labs/Other Studies Reviewed:    The following studies were reviewed  today: Echocardiogram 07/23/2015: Study Conclusions  - Left ventricle: The cavity size was normal. Wall thickness was   increased in a pattern of moderate LVH. Systolic function was   normal. The estimated ejection fraction was in the range of 55%   to 60%. Wall motion was normal; there were no regional wall   motion abnormalities. - Aortic valve: There was mild regurgitation. - Right atrium: The atrium was mildly to moderately dilated.  EKG:  EKG is not ordered today.    Recent Labs: 12/24/2018: BUN 35; Creatinine, Ser 1.15; Hemoglobin 14.5; Platelets 154; Potassium 4.6; Sodium 140  Recent Lipid Panel    Component Value Date/Time   CHOL 202 (H) 08/02/2015 1055   TRIG 50.0 08/02/2015 1055   HDL 61.70 08/02/2015 1055   CHOLHDL 3 08/02/2015 1055   VLDL 10.0 08/02/2015 1055   LDLCALC 130 (H) 08/02/2015 1055   LDLDIRECT 190.0 01/23/2008 1110    Physical Exam:    VS:  BP (!) 152/72   Pulse (!) 54   Ht 5\' 11"  (1.803 m)   Wt 178 lb (80.7 kg)   BMI 24.83 kg/m     Wt Readings from Last 3 Encounters:  01/02/19 178 lb (80.7 kg)  05/10/18 183 lb 6.4 oz (83.2 kg)  09/04/17 180 lb 6.4 oz (81.8 kg)     GEN: Well nourished, well developed in no acute distress HEENT: Normal NECK: No JVD; No carotid bruits LYMPHATICS: No lymphadenopathy CARDIAC: irregularly irregular, no murmurs, rubs, gallops RESPIRATORY:  Clear to auscultation without rales, wheezing or rhonchi  ABDOMEN: Soft, non-tender, non-distended MUSCULOSKELETAL:  No edema; No deformity  SKIN: Warm and dry, rash present on the forearms NEUROLOGIC:  Alert and oriented x 3 PSYCHIATRIC:  Normal affect   ASSESSMENT:    1. Persistent atrial fibrillation   2. Essential hypertension   3. Coronary artery disease involving native coronary artery of native heart without angina pectoris    PLAN:    In order of problems listed above:  1. Appears stable with good rate control. Tolerating oral anticoagulation without bleeding  problems. He would like to try an alternative to beta blockade secondary to his concern about drug rash. I asked him to stop metoprolol and start diltiazem CD 180 mg daily. 2. Check BP's at home. Metoprolol changed to diltiazem. 3. No angina. Continue current Rx. He cannot take statins.    Medication Adjustments/Labs and Tests Ordered: Current medicines are reviewed at length with the patient  today.  Concerns regarding medicines are outlined above.  No orders of the defined types were placed in this encounter.  Meds ordered this encounter  Medications  . diltiazem (CARDIZEM CD) 180 MG 24 hr capsule    Sig: Take 1 capsule (180 mg total) by mouth daily.    Dispense:  90 capsule    Refill:  3    Patient Instructions  Medication Instructions:  1) STOP TOPROL 2) START CARDIZEM 180 mg daily   Labwork: None  Testing/Procedures: None  Follow-Up: Your provider wants you to follow-up in: 1 year with Dr. Burt Knack. You will receive a reminder letter in the mail two months in advance. If you Jerett't receive a letter, please call our office to schedule the follow-up appointment.      Signed, Sherren Mocha, MD  01/10/2019 10:06 PM    [place]

## 2019-01-10 ENCOUNTER — Encounter: Payer: Self-pay | Admitting: Cardiovascular Disease

## 2019-02-03 ENCOUNTER — Other Ambulatory Visit: Payer: Self-pay | Admitting: Physician Assistant

## 2019-02-03 DIAGNOSIS — I4891 Unspecified atrial fibrillation: Secondary | ICD-10-CM

## 2019-02-03 NOTE — Telephone Encounter (Signed)
°*  STAT* If patient is at the pharmacy, call can be transferred to refill team.   1. Which medications need to be refilled? (please list name of each medication and dose if known) need a new prescription for Xarelto  2. Which pharmacy/location (including street and city if local pharmacy) is medication to be sent to? [place] [place]  3. Do they need a 30 day or 90 day supply?  90 days anad refills

## 2019-02-03 NOTE — Telephone Encounter (Signed)
Xarelto 20mg  refill request received; pt is 78 years old, weight- 80.7kg, Crea-1.15 on 12/24/2018, last seen by Dr. Burt Knack on 01/02/2019, Diagnosis-Afib, CrCl-60.82ml/min; Dose is appropriate based on dosing criteria. Will send in refill to requested pharmacy.

## 2019-03-31 ENCOUNTER — Other Ambulatory Visit: Payer: Self-pay | Admitting: Cardiovascular Disease

## 2019-03-31 DIAGNOSIS — I4891 Unspecified atrial fibrillation: Secondary | ICD-10-CM

## 2019-04-07 ENCOUNTER — Ambulatory Visit: Payer: Medicare Other | Admitting: Cardiovascular Disease

## 2019-05-12 ENCOUNTER — Telehealth: Payer: Self-pay | Admitting: Cardiovascular Disease

## 2019-05-12 NOTE — Telephone Encounter (Signed)
We are recommending the COVID-19 vaccine to all of our patients. Cardiac medications (including blood thinners) should not deter anyone from being vaccinated and there is no need to hold any of those medications prior to vaccine administration.     Currently, there is a hotline to call (active 05/09/19) to schedule vaccination appointments as no walk-ins will be accepted.   Number: [phone]    If you have further questions or concerns about the vaccine process, please visit [email or web address] or contact your primary care physician.    I have informed patient of instruction.

## 2019-07-14 ENCOUNTER — Telehealth: Payer: Self-pay | Admitting: Cardiovascular Disease

## 2019-07-14 NOTE — Telephone Encounter (Signed)
Patient states he is requesting recommendations from Dr. Excell Seltzer to refer a relative to a cardiologist at [facility], or surrounding areas. Please advise.

## 2019-07-15 NOTE — Telephone Encounter (Signed)
Left message to call back  

## 2019-07-15 NOTE — Telephone Encounter (Signed)
[name] [name] is not sure if the patient needs a general cardiologist or a specialist. Recommended he have the patient call his PCP for local cardiology recommendations as PCP will know specific need. He was grateful for assistance.

## 2019-08-04 ENCOUNTER — Other Ambulatory Visit: Payer: Self-pay | Admitting: Cardiovascular Disease

## 2019-08-04 DIAGNOSIS — I4891 Unspecified atrial fibrillation: Secondary | ICD-10-CM

## 2019-08-04 NOTE — Telephone Encounter (Signed)
Last OV 01/02/2019 Scr 1.15 on 12/24/2018 AWB crcl 60 ml/min Xarelto 20mg  sent in

## 2019-09-26 ENCOUNTER — Other Ambulatory Visit: Payer: Self-pay | Admitting: Cardiovascular Disease

## 2019-09-26 DIAGNOSIS — I4891 Unspecified atrial fibrillation: Secondary | ICD-10-CM

## 2019-09-30 NOTE — Telephone Encounter (Addendum)
Xarelto 20mg  refill request received. Pt is 79 years old, weight-80.7kg, Crea-1.15 on 12/24/2018, last seen by Dr. 12/26/2018 on 01/02/2019, Diagnosis-Afib, CrCl-59.24ml/min; Dose is appropriate based on dosing criteria.   Refill was sent on 08/04/2019 for 90 day supply and 1 refill (6 month supply) therefore, will need to call before sending.  Called the pharmacy and spoke to [facility] the pharmacist and she states that the April was not there, so advised I will send this one on at this time electronically.

## 2019-10-07 ENCOUNTER — Telehealth: Payer: Self-pay | Admitting: Cardiovascular Disease

## 2019-10-07 DIAGNOSIS — I4891 Unspecified atrial fibrillation: Secondary | ICD-10-CM

## 2019-10-07 NOTE — Telephone Encounter (Signed)
New Message  Pt called and stated that he is having a serious reaction to the medication diltiazem (CARDIZEM CD) 180 MG 24 hr capsule. He says that he has rashes from it and also swelling in his legs. Wants to know if there is a different medication that he could try.  Please call to discuss

## 2019-10-07 NOTE — Telephone Encounter (Signed)
[name] [name] states he is having a reaction to his Cardizem. He switched from Toprol to Cardizem last September. For a little while he did OK, but shortly after changing he acquired BLE swelling and a rash on his L leg that has been constant for months.  Dietary changes (like limiting salt) and elevated the legs do not seem to alleviate symptoms.  The swelling is not bad - his shoes still fit. But it is worrisome to him. He states his BP and HR are "fine." He says he has a history of skin issues with medications and is ready to change the Cardizem to another alternative.    To Dr. Excell Seltzer for medication recommendations.

## 2019-10-08 NOTE — Telephone Encounter (Signed)
We stopped beta blocker last year for rash. Ok to hold cardizem and see if things improve. I wouldn't recommend another medicine at this time. Will need to keep an eye on his heart rate off diltiazem.

## 2019-10-08 NOTE — Telephone Encounter (Signed)
Attempted to call patient - mailbox is full. Unable to leave message.  Will try again later.

## 2019-10-09 NOTE — Telephone Encounter (Signed)
Attempted to call patient - mailbox is full. Unable to leave message. ° °Will try again later. °

## 2019-10-15 NOTE — Telephone Encounter (Signed)
Attempted to call patient - mailbox is full. Unable to leave message. ° °Will try again later. °

## 2019-10-15 NOTE — Telephone Encounter (Signed)
Attempted to call patient - mailbox is full. Unable to leave message.  Will attempt to send MyChart message will Dr. Earmon Phoenix instruction.

## 2019-10-21 MED ORDER — RIVAROXABAN 20 MG PO TABS: 20.0000 mg | ORAL_TABLET | Freq: Every day | ORAL | 3 refills | Status: DC

## 2019-10-21 NOTE — Telephone Encounter (Signed)
Informed the patient he may STOP CARDIZEM. He understands to monitor HR and call if it is uncontrolled. Per request, Xarelto refills called in. 1 year visit scheduled. The patient was grateful for assistance.

## 2019-10-27 ENCOUNTER — Telehealth: Payer: Self-pay | Admitting: Cardiovascular Disease

## 2019-10-27 NOTE — Telephone Encounter (Signed)
Pt called and says he is having pain in his left leg. It is weeping liquid and he has pain when he walks. He read about his symptoms and is concerned that there is a circulation problem. He wanted to know what to do about his symptoms

## 2019-10-27 NOTE — Telephone Encounter (Signed)
The patient reports his left leg has been swollen for 2-3 weeks.  It is pink and weeping and painful and warm.  He states the swelling isn't "that bad"- he can easily put on his shoe.  The swelling does not resolve with elevation.  He has been doing "a lot of googling" and thinks he may have a circulation issue.  He has been putting oregano and tea tree oil on the leg to prevent infection. Instructed the patient to call PCP for evaluation of cellulitis. He states he will hold off and see if it gets better. Will call the patient in a couple days for an update.  He was grateful for assistance.

## 2019-10-29 ENCOUNTER — Telehealth: Payer: Self-pay | Admitting: Cardiovascular Disease

## 2019-10-29 NOTE — Telephone Encounter (Signed)
New message  Pt c/o medication issue:  1. Name of Medication: diltiazem 180 mg  2. How are you currently taking this medication (dosage and times per day)? n/a  3. Are you having a reaction (difficulty breathing--STAT)?no   4. What is your medication issue? Patient wants to know when or if he should start taking this medication again. Please advise.

## 2019-10-29 NOTE — Telephone Encounter (Signed)
Follow up  Patient is calling back in to follow up to see if the office knows which dermatologist that the  Office referred him to. Please assist.

## 2019-10-29 NOTE — Telephone Encounter (Signed)
The patient called to ask if he should restart his cardizem.  Since he is still having skin issues and his BP/HR are stable, he will stay off the medication for now. Reiterated to him the importance of seeing PCP ASAP. He will continue to monitor HR and BP and call if either are elevated. He was grateful for call back.

## 2019-10-29 NOTE — Telephone Encounter (Signed)
The patient will see his PCP to evaluate leg and get a dermatology referral at that time if needed. He understands to call if PCP thinks cardiology is needed and his 10/4 appointment will be rescheduled sooner. He was grateful for assistance.

## 2019-11-12 ENCOUNTER — Emergency Department (HOSPITAL_BASED_OUTPATIENT_CLINIC_OR_DEPARTMENT_OTHER): Payer: Medicare Other

## 2019-11-12 ENCOUNTER — Other Ambulatory Visit: Payer: Self-pay

## 2019-11-12 ENCOUNTER — Emergency Department (HOSPITAL_BASED_OUTPATIENT_CLINIC_OR_DEPARTMENT_OTHER)
Admission: EM | Admit: 2019-11-12 | Discharge: 2019-11-12 | Disposition: A | Discharge status: Home / Self Care | Payer: Medicare Other | Attending: Emergency Medicine | Admitting: Emergency Medicine

## 2019-11-12 ENCOUNTER — Encounter (HOSPITAL_BASED_OUTPATIENT_CLINIC_OR_DEPARTMENT_OTHER): Payer: Self-pay

## 2019-11-12 DIAGNOSIS — W19XXXA Unspecified fall, initial encounter: Secondary | ICD-10-CM | POA: Diagnosis not present

## 2019-11-12 DIAGNOSIS — I7090 Unspecified atherosclerosis: Secondary | ICD-10-CM | POA: Diagnosis not present

## 2019-11-12 DIAGNOSIS — S0990XA Unspecified injury of head, initial encounter: Secondary | ICD-10-CM | POA: Diagnosis not present

## 2019-11-12 DIAGNOSIS — G8929 Other chronic pain: Secondary | ICD-10-CM | POA: Diagnosis not present

## 2019-11-12 DIAGNOSIS — Z87891 Personal history of nicotine dependence: Secondary | ICD-10-CM | POA: Diagnosis not present

## 2019-11-12 DIAGNOSIS — M1712 Unilateral primary osteoarthritis, left knee: Secondary | ICD-10-CM | POA: Diagnosis not present

## 2019-11-12 DIAGNOSIS — I2510 Atherosclerotic heart disease of native coronary artery without angina pectoris: Secondary | ICD-10-CM | POA: Insufficient documentation

## 2019-11-12 DIAGNOSIS — M25462 Effusion, left knee: Secondary | ICD-10-CM | POA: Diagnosis not present

## 2019-11-12 DIAGNOSIS — J3489 Other specified disorders of nose and nasal sinuses: Secondary | ICD-10-CM | POA: Diagnosis not present

## 2019-11-12 DIAGNOSIS — J322 Chronic ethmoidal sinusitis: Secondary | ICD-10-CM | POA: Diagnosis not present

## 2019-11-12 DIAGNOSIS — M25562 Pain in left knee: Secondary | ICD-10-CM | POA: Diagnosis not present

## 2019-11-12 MED ORDER — ACETAMINOPHEN 325 MG PO TABS
650.0000 mg | ORAL_TABLET | Freq: Once | ORAL | Status: AC
  Administered 2019-11-12: 650 mg via ORAL
  Filled 2019-11-12: qty 2

## 2019-11-12 NOTE — Discharge Instructions (Signed)
Use Tylenol to help with pain control for your knee. You may use the knee brace as needed for pain control. I recommend you use a walker to get around for the next several days until you are more steady on your feet. If your pain is not improving in the next week, follow-up with either your primary care doctor or the sports medicine doctor listed below for reevaluation. Return to the emergency room if you develop severe head pain, vision changes, vomiting, numbness or tingling, inability to walk, or any new, worsening, or concerning symptoms.

## 2019-11-12 NOTE — ED Triage Notes (Addendum)
Pt c/o fall with left knee injury x 1 day ago also reports hitting his face car door when falling , pt is on xarelto
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
## 2019-11-24 DIAGNOSIS — I872 Venous insufficiency (chronic) (peripheral): Secondary | ICD-10-CM | POA: Diagnosis not present

## 2019-11-27 ENCOUNTER — Ambulatory Visit: Payer: Medicare Other | Admitting: Family Medicine

## 2019-12-20 DIAGNOSIS — H2513 Age-related nuclear cataract, bilateral: Secondary | ICD-10-CM | POA: Diagnosis not present

## 2019-12-30 ENCOUNTER — Telehealth: Payer: Self-pay | Admitting: Cardiovascular Disease

## 2019-12-30 NOTE — Telephone Encounter (Signed)
Patient is returning Tina's call. Advised the patient Tina will call him back.  

## 2019-12-30 NOTE — Telephone Encounter (Signed)
Complaining of leg edema. Started two weeks ago. Weight 175.6 pounds this morning. Fluctuating ~3 pounds in the last week.    Advised the patient about decreasing his salt intake and he agrees that he thinks this is part of his problem. As well as getting some compression socks to wear when he is on his feet at work. Recommend elevating his feet as much as possible, even with a pillow at night when he sleeps.

## 2019-12-30 NOTE — Telephone Encounter (Signed)
Left message to call back  

## 2019-12-30 NOTE — Telephone Encounter (Signed)
    Pt c/o swelling: STAT is pt has developed SOB within 24 hours  1) How much weight have you gained and in what time span? Not sure  2) If swelling, where is the swelling located? Both legs   3) Are you currently taking a fluid pill? No  4) Are you currently SOB? No  5) Do you have a log of your daily weights (if so, list)? None   6) Have you gained 3 pounds in a day or 5 pounds in a week? No  7) Have you traveled recently? No   Pt said it started on his left leg and now both of his leg is swollen. He said no SOB and not sure if he gained weight.

## 2020-02-02 ENCOUNTER — Encounter: Payer: Self-pay | Admitting: Cardiovascular Disease

## 2020-02-02 ENCOUNTER — Ambulatory Visit (INDEPENDENT_AMBULATORY_CARE_PROVIDER_SITE_OTHER): Payer: Medicare Other | Admitting: Cardiovascular Disease

## 2020-02-02 ENCOUNTER — Other Ambulatory Visit: Payer: Self-pay

## 2020-02-02 VITALS — BP 140/60 | HR 99 | Ht 71.0 in | Wt 180.0 lb

## 2020-02-02 DIAGNOSIS — I10 Essential (primary) hypertension: Secondary | ICD-10-CM | POA: Diagnosis not present

## 2020-02-02 DIAGNOSIS — I872 Venous insufficiency (chronic) (peripheral): Secondary | ICD-10-CM

## 2020-02-02 DIAGNOSIS — E782 Mixed hyperlipidemia: Secondary | ICD-10-CM

## 2020-02-02 DIAGNOSIS — I4821 Permanent atrial fibrillation: Secondary | ICD-10-CM | POA: Diagnosis not present

## 2020-02-02 NOTE — Progress Notes (Signed)
Cardiology Office Note:    Date:  02/02/2020   ID:  [name] [name], DOB 30-Jan-1941, MRN [id number]  PCP:  Doristine Bosworth, MD  [facility] Cardiologist:  Tonny Bollman, MD  [facility] Electrophysiologist:  None   Referring MD: Doristine Bosworth, MD   Chief Complaint  Patient presents with  . Leg Swelling    History of Present Illness:    [name] [name] is a 79 y.o. male with a hx of chronic atrial fibrillation, presenting for follow-up evaluation.  The patient has been chronically anticoagulated with rivaroxaban.  He has had some problems with medication intolerances and feels that he has had a drug rash on his arms.  When he was seen last 1 year ago, he was transition from metoprolol succinate to long-acting diltiazem.  The patient is here alone today.  From a cardiac perspective, he feels he is doing well.  He denies chest pain, heart palpitations, shortness of breath, orthopnea, or PND.  He is walking 30 minutes/day without functional limitation.  He still has a lot of problems with leg swelling and chronic stasis dermatitis.  His legs swell all the way up to the knees.  He uses some sort of medication for this both the topical and a pill but only takes it on an intermittent basis because he states that it has some toxicity if he takes it long-term.  I think this is under the direction of his dermatologist.  He has tried compression but it bothers his skin.  Past Medical History:  Diagnosis Date  . CAD (coronary artery disease)    LHC 11/06: pLAD 90, pD1 30, oD2 25 with inf branch 60; septal perf ostial 95; prox MOM 25, RCA 25, oPL2 95, EF 65 >> PCI:  BMS to proximal LAD   . FH: CAD (coronary artery disease)   . History of echocardiogram    Echo 3/17:  Mod LVH, EF 55-60, no RWMA, mild AI, mild to mod RAE  . History of tobacco abuse   . Hyperlipidemia   . Persistent atrial fibrillation (HCC)    Xarelto for a/c    Past Surgical History:  Procedure Laterality Date  . CARDIAC  CATHETERIZATION    . CORONARY STENT PLACEMENT     stents of the proximal lad with bare metal stent  . PERCUTANEOUS CORONARY STENT INTERVENTION (PCI-S)      Current Medications: Current Meds  Medication Sig  . Ascorbic Acid (VITAMIN C PO) Take 1 tablet by mouth daily.  . Cholecalciferol (D3-50) 1.25 MG (50000 UT) capsule Take 50,000 Units by mouth daily.  . Cod Liver Oil OIL Take 1 Dose by mouth daily.  . Coenzyme Q10 (CO Q 10 PO) Take 1 capsule by mouth daily.  Marland Kitchen MAGNESIUM PO Take 1 tablet by mouth daily.  . Menaquinone-7 (VITAMIN K2 PO) Take 1 tablet by mouth daily.  . Probiotic Product (PROBIOTIC ADVANCED PO) Take 1 tablet by mouth daily.   . rivaroxaban (XARELTO) 20 MG TABS tablet Take 1 tablet (20 mg total) by mouth daily with supper.     Allergies:   Patient has no known allergies.   Social History   Socioeconomic History  . Marital status: Married    Spouse name: Not on file  . Number of children: Not on file  . Years of education: Not on file  . Highest education level: Not on file  Occupational History  . 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  Smoking status: Former Smoker    Quit date: 05/10/1978    Years since quitting: 41.7  . Smokeless tobacco: Never Used  Vaping Use  . Vaping Use: Never used  Substance and Sexual Activity  . Alcohol use: No  . Drug use: No  . Sexual activity: Not on file  Other Topics Concern  . Not on file  Social History Narrative  . Not on file   Social Determinants of Health   Financial Resource Strain:   . Difficulty of Paying Living Expenses: Not on file  Food Insecurity:   . Worried About Programme researcher, broadcasting/film/video in the Last Year: Not on file  . Ran Out of Food in the Last Year: Not on file  Transportation Needs:   . Lack of Transportation (Medical): Not on file  . Lack of Transportation (Non-Medical): Not on file  Physical Activity:   . Days of Exercise per Week: Not on file  . Minutes of  Exercise per Session: Not on file  Stress:   . Feeling of Stress : Not on file  Social Connections:   . Frequency of Communication with Friends and Family: Not on file  . Frequency of Social Gatherings with Friends and Family: Not on file  . Attends Religious Services: Not on file  . Active Member of Clubs or Organizations: Not on file  . Attends Banker Meetings: Not on file  . Marital Status: Not on file     Family History: The patient's family history includes Cancer - Colon in his mother; Heart attack in his father; Hypertension in his father.  ROS:   Please see the history of present illness.    All other systems reviewed and are negative.  EKGs/Labs/Other Studies Reviewed:    The following studies were reviewed today: Echo 07-23-2015: Study Conclusions   - Left ventricle: The cavity size was normal. Wall thickness was  increased in a pattern of moderate LVH. Systolic function was  normal. The estimated ejection fraction was in the range of 55%  to 60%. Wall motion was normal; there were no regional wall  motion abnormalities.  - Aortic valve: There was mild regurgitation.  - Right atrium: The atrium was mildly to moderately dilated.   EKG:  EKG is ordered today.  The ekg ordered today demonstrates atrial fibrillation, heart rate 99 bpm  Recent Labs: No results found for requested labs within last 8760 hours.  Recent Lipid Panel    Component Value Date/Time   CHOL 202 (H) 08/02/2015 1055   TRIG 50.0 08/02/2015 1055   HDL 61.70 08/02/2015 1055   CHOLHDL 3 08/02/2015 1055   VLDL 10.0 08/02/2015 1055   LDLCALC 130 (H) 08/02/2015 1055   LDLDIRECT 190.0 01/23/2008 1110    Physical Exam:    VS:  BP 140/60   Pulse 99   Ht 5\' 11"  (1.803 m)   Wt 180 lb (81.6 kg)   SpO2 96%   BMI 25.10 kg/m     Wt Readings from Last 3 Encounters:  02/02/20 180 lb (81.6 kg)  11/12/19 180 lb (81.6 kg)  01/02/19 178 lb (80.7 kg)     GEN:  Well nourished,  well developed in no acute distress HEENT: Normal NECK: No JVD; No carotid bruits LYMPHATICS: No lymphadenopathy CARDIAC: irregularly irregular, no murmurs, rubs, gallops RESPIRATORY:  Clear to auscultation without rales, wheezing or rhonchi  ABDOMEN: Soft, non-tender, non-distended MUSCULOSKELETAL:  3+ pretibial edema, erythma; No deformity  SKIN: prominent stasis dermatitis rash  NEUROLOGIC:  Alert and oriented x 3 PSYCHIATRIC:  Normal affect   ASSESSMENT:    1. Permanent atrial fibrillation (HCC)   2. Essential hypertension   3. Mixed hyperlipidemia   4. Venous stasis dermatitis of both lower extremities    PLAN:    In order of problems listed above:  1. The patient has been intolerant to beta-blockers and calcium channel blockers because of rash.  He is tolerating oral anticoagulation with rivaroxaban.  Heart rate control is somewhat borderline, but I reviewed his home blood pressure and pulse readings which are pretty good.  His pulse readings are typically in the 80s with some in the 90s.  I think we should continue his current therapy and check an echocardiogram.  Otherwise no changes recommended. 2. Blood pressure is doing well off of all medication.  His home readings are in good range consistently less than 140 mmHg. 3. Continued diet and lifestyle modification 4. Patient appears to have severe venous stasis dermatitis and chronic edema.  We discussed conservative measures.  He is given information on the lounge Dr. Marcella Dubs.  I gave him a referral to the wound care center and wonder if he might be a candidate for Unna boots or some other type of compressive therapy.   Medication Adjustments/Labs and Tests Ordered: Current medicines are reviewed at length with the patient today.  Concerns regarding medicines are outlined above.  Orders Placed This Encounter  Procedures  . AMB referral to wound care center  . EKG 12-Lead  . ECHOCARDIOGRAM COMPLETE   No orders of the  defined types were placed in this encounter.   Patient Instructions  Medication Instructions:  Your provider recommends that you continue on your current medications as directed. Please refer to the Current Medication list given to you today.   *If you need a refill on your cardiac medications before your next appointment, please call your pharmacy*  Testing/Procedures: Your provider has requested that you have an echocardiogram. Echocardiography is a painless test that uses sound waves to create images of your heart. It provides your doctor with information about the size and shape of your heart and how well your heart's chambers and valves are working. This procedure takes approximately one hour. There are no restrictions for this procedure.    Follow-Up: At [facility], you and your health needs are our priority.  As part of our continuing mission to provide you with exceptional heart care, we have created designated Provider Care Teams.  These Care Teams include your primary Cardiologist (physician) and Advanced Practice Providers (APPs -  Physician Assistants and Nurse Practitioners) who all work together to provide you with the care you need, when you need it. Your next appointment:   12 month(s) The format for your next appointment:   In Person Provider:   You may see Tonny Bollman, MD or one of the following Advanced Practice Providers on your designated Care Team:    Tereso Newcomer, PA-C  Chelsea Aus, [place]     For your leg edema you should do the following: 1. Leg elevation - I recommend the Lounge Dr. Leg rest.  See below for details  2. Salt restriction  -  Use potassium chloride instead of regular salt as a salt substitute. 3. Walk regularly 4. Compression hose - [facility] 5. Weight loss    Available on [email or web address] Or  Go to [email or web address]         Signed, Tonny Bollman, MD  02/02/2020 4:56 PM  [facility]

## 2020-02-02 NOTE — Patient Instructions (Addendum)
Medication Instructions:  Your provider recommends that you continue on your current medications as directed. Please refer to the Current Medication list given to you today.   *If you need a refill on your cardiac medications before your next appointment, please call your pharmacy*  Testing/Procedures: Your provider has requested that you have an echocardiogram. Echocardiography is a painless test that uses sound waves to create images of your heart. It provides your doctor with information about the size and shape of your heart and how well your heart's chambers and valves are working. This procedure takes approximately one hour. There are no restrictions for this procedure.  Follow-Up: At [facility], you and your health needs are our priority.  As part of our continuing mission to provide you with exceptional heart care, we have created designated Provider Care Teams.  These Care Teams include your primary Cardiologist (physician) and Advanced Practice Providers (APPs -  Physician Assistants and Nurse Practitioners) who all work together to provide you with the care you need, when you need it. Your next appointment:   12 month(s) The format for your next appointment:   In Person Provider:   You may see [name] Cooper, MD or one of the following Advanced Practice Providers on your designated Care Team:    Scott Weaver, PA-C  Vin Bhagat, PA-C      For your leg edema you should do the following: 1. Leg elevation - I recommend the Lounge Dr. Leg rest.  See below for details  2. Salt restriction  -  Use potassium chloride instead of regular salt as a salt substitute. 3. Walk regularly 4. Compression hose - [facility] 5. Weight loss    Available on [email or web address] Or  Go to [email or web address]      

## 2020-02-17 ENCOUNTER — Other Ambulatory Visit: Payer: Self-pay

## 2020-02-17 ENCOUNTER — Ambulatory Visit (HOSPITAL_COMMUNITY): Payer: Medicare Other | Attending: Cardiovascular Disease

## 2020-02-17 DIAGNOSIS — I4821 Permanent atrial fibrillation: Secondary | ICD-10-CM

## 2020-02-17 LAB — ECHOCARDIOGRAM COMPLETE
Area-P 1/2: 3.43 cm2
P 1/2 time: 491 msec
S' Lateral: 3.1 cm

## 2020-02-20 ENCOUNTER — Telehealth: Payer: Self-pay | Admitting: Cardiovascular Disease

## 2020-02-20 NOTE — Telephone Encounter (Signed)
Informed the patient that it is recommended anyone eligible to get the booster if they'd like. He was grateful for call.

## 2020-02-20 NOTE — Telephone Encounter (Signed)
Patient calling to find out if it is okay for him to have the covid booster shot.

## 2020-02-25 ENCOUNTER — Encounter (HOSPITAL_BASED_OUTPATIENT_CLINIC_OR_DEPARTMENT_OTHER): Payer: Medicare Other | Attending: Physician Assistant | Admitting: Physician Assistant

## 2020-03-02 ENCOUNTER — Telehealth: Payer: Self-pay | Admitting: Cardiovascular Disease

## 2020-03-02 NOTE — Telephone Encounter (Signed)
Gave patient the number for [facility] to help establish with PCP. He was grateful for assistance.

## 2020-03-02 NOTE — Telephone Encounter (Signed)
Pt would like Dr. Earmon Phoenix recommendation for a PCP. Please advise

## 2020-03-03 ENCOUNTER — Telehealth: Payer: Self-pay | Admitting: Cardiovascular Disease

## 2020-03-03 DIAGNOSIS — I4821 Permanent atrial fibrillation: Secondary | ICD-10-CM

## 2020-03-03 NOTE — Telephone Encounter (Signed)
Reviewed PharmD's recommendations in detail with the patient. He will research Eliquis tonight and requests a call tomorrow to make his decision.

## 2020-03-03 NOTE — Telephone Encounter (Signed)
Looks like pt has a history of dermatitis that he has attributed to other meds in the past. Not sure that Xarelto is the cause of his symptoms, but he can try an alternative anticoagulant if he would like. Do not recommend stopping anticoag altogether due to risk of stroke with his afib (CHADS2VASc score is 4 from his age, HTN, and CAD). Could try Eliquis 5mg  BID if he would like, copay should be the same as his Xarelto and we can use a 1 month free card as well. He should also come in for repeat lab work to ensure correct dosing of his anticoagulant. He hasn't had a BMET and CBC checked since August 2020 and this is recommended annually for patients on DOAC therapy.

## 2020-03-03 NOTE — Telephone Encounter (Signed)
    Pt c/o medication issue:  1. Name of Medication:   rivaroxaban (XARELTO) 20 MG TABS tablet    2. How are you currently taking this medication (dosage and times per day)? Take 1 tablet (20 mg total) by mouth daily with supper.  3. Are you having a reaction (difficulty breathing--STAT)?   4. What is your medication issue? Pt said he is having reaction from his xarelto, he said he have dermatitis on his lower legs. He wanted to know if there's an alternative drug he can have or if he can get off xarelto for a while and see if his skin reaction will clear up

## 2020-03-04 MED ORDER — APIXABAN 5 MG PO TABS: 5.0000 mg | ORAL_TABLET | Freq: Two times a day (BID) | ORAL | 11 refills | Status: DC

## 2020-03-04 NOTE — Telephone Encounter (Addendum)
The patient agrees a trial of Eliquis 5 mg BID. He will come in tomorrow for BMET and CBC. Samples and 30 day free card placed at front desk for patient to pick up when he has labs drawn. He was grateful for assistance.

## 2020-03-05 ENCOUNTER — Other Ambulatory Visit: Payer: Self-pay

## 2020-03-05 ENCOUNTER — Other Ambulatory Visit: Payer: Medicare Other | Admitting: *Deleted

## 2020-03-05 DIAGNOSIS — I4821 Permanent atrial fibrillation: Secondary | ICD-10-CM | POA: Diagnosis not present

## 2020-03-06 LAB — CBC WITH DIFFERENTIAL/PLATELET
Basophils Absolute: 0 10*3/uL (ref 0.0–0.2)
Basos: 1 %
EOS (ABSOLUTE): 0.2 10*3/uL (ref 0.0–0.4)
Eos: 3 %
Hematocrit: 46.6 % (ref 37.5–51.0)
Hemoglobin: 15.5 g/dL (ref 13.0–17.7)
Immature Grans (Abs): 0 10*3/uL (ref 0.0–0.1)
Immature Granulocytes: 0 %
Lymphocytes Absolute: 0.8 10*3/uL (ref 0.7–3.1)
Lymphs: 15 %
MCH: 29.8 pg (ref 26.6–33.0)
MCHC: 33.3 g/dL (ref 31.5–35.7)
MCV: 90 fL (ref 79–97)
Monocytes Absolute: 0.5 10*3/uL (ref 0.1–0.9)
Monocytes: 9 %
Neutrophils Absolute: 4.2 10*3/uL (ref 1.4–7.0)
Neutrophils: 72 %
Platelets: 179 10*3/uL (ref 150–450)
RBC: 5.2 x10E6/uL (ref 4.14–5.80)
RDW: 13.7 % (ref 11.6–15.4)
WBC: 5.7 10*3/uL (ref 3.4–10.8)

## 2020-03-06 LAB — BASIC METABOLIC PANEL
BUN/Creatinine Ratio: 35 — ABNORMAL HIGH (ref 10–24)
BUN: 33 mg/dL — ABNORMAL HIGH (ref 8–27)
CO2: 26 mmol/L (ref 20–29)
Calcium: 9.4 mg/dL (ref 8.6–10.2)
Chloride: 104 mmol/L (ref 96–106)
Creatinine, Ser: 0.95 mg/dL (ref 0.76–1.27)
GFR calc Af Amer: 88 mL/min/{1.73_m2} (ref >59)
GFR calc non Af Amer: 76 mL/min/{1.73_m2} (ref >59)
Glucose: 78 mg/dL (ref 65–99)
Potassium: 4.4 mmol/L (ref 3.5–5.2)
Sodium: 140 mmol/L (ref 134–144)

## 2020-03-18 DIAGNOSIS — L089 Local infection of the skin and subcutaneous tissue, unspecified: Secondary | ICD-10-CM | POA: Diagnosis not present

## 2020-03-18 DIAGNOSIS — I872 Venous insufficiency (chronic) (peripheral): Secondary | ICD-10-CM | POA: Diagnosis not present

## 2020-04-01 ENCOUNTER — Other Ambulatory Visit: Payer: Self-pay

## 2020-04-01 ENCOUNTER — Encounter (HOSPITAL_BASED_OUTPATIENT_CLINIC_OR_DEPARTMENT_OTHER): Payer: Medicare Other | Attending: Internal Medicine | Admitting: Internal Medicine

## 2020-04-01 DIAGNOSIS — L97229 Non-pressure chronic ulcer of left calf with unspecified severity: Secondary | ICD-10-CM | POA: Diagnosis not present

## 2020-04-01 DIAGNOSIS — I4891 Unspecified atrial fibrillation: Secondary | ICD-10-CM | POA: Insufficient documentation

## 2020-04-01 DIAGNOSIS — I890 Lymphedema, not elsewhere classified: Secondary | ICD-10-CM | POA: Insufficient documentation

## 2020-04-01 DIAGNOSIS — Z955 Presence of coronary angioplasty implant and graft: Secondary | ICD-10-CM | POA: Diagnosis not present

## 2020-04-01 DIAGNOSIS — Z7901 Long term (current) use of anticoagulants: Secondary | ICD-10-CM | POA: Diagnosis not present

## 2020-04-01 DIAGNOSIS — I87323 Chronic venous hypertension (idiopathic) with inflammation of bilateral lower extremity: Secondary | ICD-10-CM | POA: Diagnosis not present

## 2020-04-01 DIAGNOSIS — I2510 Atherosclerotic heart disease of native coronary artery without angina pectoris: Secondary | ICD-10-CM | POA: Insufficient documentation

## 2020-04-01 DIAGNOSIS — I872 Venous insufficiency (chronic) (peripheral): Secondary | ICD-10-CM | POA: Diagnosis not present

## 2020-04-01 DIAGNOSIS — E785 Hyperlipidemia, unspecified: Secondary | ICD-10-CM | POA: Insufficient documentation

## 2020-04-01 NOTE — Progress Notes (Signed)
[place], [place] ([id number]) Visit Report for 04/01/2020 Abuse/Suicide Risk Screen Details Patient Name: Date of Service: [name] [name] 04/01/2020 2:45 PM Medical Record Number: [id number] Patient Account Number: [id number] Date of Birth/Sex: Treating RN: 01/10/1941 (79 y.o. Male) [name] [name] Primary Care Joseangel Nettleton: Collie Siad Other Clinician: Referring Michaila Kenney: Treating Jaysha Lasure/Extender: Scherrie Bateman in Treatment: 0 Abuse/Suicide Risk Screen Items Answer ABUSE RISK SCREEN: Has anyone close to you tried to hurt or harm you recentlyo No Do you feel uncomfortable with anyone in your familyo No Has anyone forced you do things that you didnt want to doo No Electronic Signature(s) Signed: 04/01/2020 4:55:20 PM By: [name] Pax RN Entered By: [name] [name] on 04/01/2020 16:06:56 -------------------------------------------------------------------------------- Activities of Daily Living Details Patient Name: Date of Service: [name] [name] 04/01/2020 2:45 PM Medical Record Number: [id number] Patient Account Number: [id number] Date of Birth/Sex: Treating RN: 1940-07-08 (79 y.o. Male) [name] [name] Primary Care Yvette Roark: Collie Siad Other Clinician: Referring Zhoey Blackstock: Treating Sallie Staron/Extender: Scherrie Bateman in Treatment: 0 Activities of Daily Living Items Answer Activities of Daily Living (Please select one for each item) Drive Automobile Completely Able T Medications ake Completely Able Use T elephone Completely Able Care for Appearance Completely Able Use T oilet Completely Able Bath / Shower Completely Able Dress Self Completely Able Feed Self Completely Able Walk Completely Able Get In / Out Bed Completely Able Housework Completely Able Prepare Meals Completely Able Handle Money Completely Able Shop for Self Completely Able Electronic Signature(s) Signed: 04/01/2020 4:55:20 PM By: [name] Pax RN Entered By: [name] [name]  on 04/01/2020 16:07:44 -------------------------------------------------------------------------------- Education Screening Details Patient Name: Date of Service: [name] Deforest, [name] 04/01/2020 2:45 PM Medical Record Number: [id number] Patient Account Number: [id number] Date of Birth/Sex: Treating RN: [birth year] (79 y.o. Male) [name] [name] Primary Care [name] [name]: Collie Siad Other Clinician: Referring Mikhia Dusek: Treating [name]/Extender: Scherrie Bateman in Treatment: 0 Primary Learner Assessed: Patient Learning Preferences/Education Level/Primary Language Learning Preference: Explanation Highest Education Level: High School Preferred Language: English Cognitive Barrier Language Barrier: No Translator Needed: No Memory Deficit: No Emotional Barrier: No Cultural/Religious Beliefs Affecting Medical Care: No Physical Barrier Impaired Vision: Yes Glasses Impaired Hearing: No Decreased Hand dexterity: No Knowledge/Comprehension Knowledge Level: Medium Comprehension Level: High Ability to understand written instructions: High Ability to understand verbal instructions: High Motivation Anxiety Level: Anxious Cooperation: Cooperative Education Importance: Acknowledges Need Interest in Health Problems: Asks Questions Perception: Coherent Willingness to Engage in Self-Management High Activities: Readiness to Engage in Self-Management High Activities: Electronic Signature(s) Signed: 04/01/2020 4:55:20 PM By: [name] Pax RN Entered By: [name] [name] on 04/01/2020 16:08:19 -------------------------------------------------------------------------------- Fall Risk Assessment Details Patient Name: Date of Service: [name] Deforest, [name] 04/01/2020 2:45 PM Medical Record Number: [id number] Patient Account Number: [id number] Date of Birth/Sex: Treating RN: 10/23/1940 (79 y.o. Male) [name] [name] Primary Care Alonte Wulff: Collie Siad Other Clinician: Referring  Ta Fair: Treating Danisha Brassfield/Extender: Scherrie Bateman in Treatment: 0 Fall Risk Assessment Items Have you had 2 or more falls in the last 12 monthso 0 No Have you had any fall that resulted in injury in the last 12 monthso 0 No FALLS RISK SCREEN History of falling - immediate or within 3 months 0 No Secondary diagnosis (Do you have 2 or more medical diagnoseso) 0 No Ambulatory aid None/bed rest/wheelchair/nurse 0 No Crutches/cane/walker 0 No Furniture 0 No Intravenous therapy Access/Saline/Heparin Lock 0 No Gait/Transferring Normal/ bed rest/ wheelchair 0 No Weak (short steps with or without shuffle, stooped but able  to lift head while walking, may seek 0 No support from furniture) Impaired (short steps with shuffle, may have difficulty arising from chair, head down, impaired 0 No balance) Mental Status Oriented to own ability 0 No Electronic Signature(s) Signed: 04/01/2020 4:55:20 PM By: [name] Pax RN Entered By: [name] [name] on 04/01/2020 16:08:27 -------------------------------------------------------------------------------- Foot Assessment Details Patient Name: Date of Service: [name] Deforest, [name] 04/01/2020 2:45 PM Medical Record Number: [id number] Patient Account Number: [id number] Date of Birth/Sex: Treating RN: Aug 24, 1940 (79 y.o. Male) [name] [name] Primary Care Betty Brooks: Collie Siad Other Clinician: Referring Delayza Lungren: Treating Kaleyah Labreck/Extender: Scherrie Bateman in Treatment: 0 Foot Assessment Items Site Locations + = Sensation present, - = Sensation absent, C = Callus, U = Ulcer R = Redness, W = Warmth, M = Maceration, PU = Pre-ulcerative lesion F = Fissure, S = Swelling, D = Dryness Assessment Right: Left: Other Deformity: No No Prior Foot Ulcer: No No Prior Amputation: No No Charcot Joint: No No Ambulatory Status: Ambulatory Without Help Gait: Steady Electronic Signature(s) Signed: 04/01/2020 4:55:20 PM By:  [name] Pax RN Entered By: [name] [name] on 04/01/2020 16:17:26 -------------------------------------------------------------------------------- Nutrition Risk Screening Details Patient Name: Date of Service: [name] [name] 04/01/2020 2:45 PM Medical Record Number: [id number] Patient Account Number: [id number] Date of Birth/Sex: Treating RN: [birth year] (79 y.o. Male) [name] [name] Primary Care Marcy Sookdeo: Collie Siad Other Clinician: Referring Bowie Delia: Treating Saryah Loper/Extender: Scherrie Bateman in Treatment: 0 Height (in): 71 Weight (lbs): 170 Body Mass Index (BMI): 23.7 Nutrition Risk Screening Items Score Screening NUTRITION RISK SCREEN: I have an illness or condition that made me change the kind and/or amount of food I eat 0 No I eat fewer than two meals per day 0 No I eat few fruits and vegetables, or milk products 0 No I have three or more drinks of beer, liquor or wine almost every day 0 No I have tooth or mouth problems that make it hard for me to eat 0 No I Calbert't always have enough money to buy the food I need 0 No I eat alone most of the time 0 No I take three or more different prescribed or over-the-counter drugs a day 1 Yes Without wanting to, I have lost or gained 10 pounds in the last six months 0 No I am not always physically able to shop, cook and/or feed myself 0 No Nutrition Protocols Good Risk Protocol 0 No interventions needed Moderate Risk Protocol High Risk Proctocol Risk Level: Good Risk Score: 1 Electronic Signature(s) Signed: 04/01/2020 4:55:20 PM By: [name] Pax RN Entered By: [name] [name] on 04/01/2020 16:08:42

## 2020-04-01 NOTE — Progress Notes (Signed)
[place], [place] ([id number]) Visit Report for 04/01/2020 Chief Complaint Document Details Patient Name: Date of Service: [name] [name] 04/01/2020 2:45 PM Medical Record Number: [id number] Patient Account Number: [id number] Date of Birth/Sex: Treating RN: 05-02-1940 (79 y.o. Tammy Sours Primary Care Provider: Collie Siad Other Clinician: Referring Provider: Treating Provider/Extender: Daiva Huge, Clement Sayres Weeks in Treatment: 0 Information Obtained from: Patient Chief Complaint 04/01/2020; patient arrives in clinic today referred by his cardiologist Dr. Excell Seltzer for review of bilateral lower extremity lymphedema and venous insufficiency Electronic Signature(s) Signed: 04/01/2020 5:09:23 PM By: Baltazar Najjar MD Entered By: Baltazar Najjar on 04/01/2020 17:00:25 -------------------------------------------------------------------------------- HPI Details Patient Name: Date of Service: [name] Deforest, [name] 04/01/2020 2:45 PM Medical Record Number: [id number] Patient Account Number: [id number] Date of Birth/Sex: Treating RN: 24-Dec-1940 (79 y.o. Tammy Sours Primary Care Provider: Collie Siad Other Clinician: Referring Provider: Treating Provider/Extender: Gwendel Hanson Weeks in Treatment: 0 History of Present Illness HPI Description: ADMISSION 04/01/2020 This is a 79 year old man who lives at home with his wife. He has a history of atrial fibrillation and coronary artery disease but does not have congestive heart failure. It is a bit difficult to get any history out of him he says that his legs have only been swollen for 3 or 4 months but this looks like much more of a chronic problem than that. He was recently prescribed cephalexin and TCA by his cardiologist and referred here. There is no real open wound here per se but there is weeping edema fluid especially in the left posterior calf, dry flaking skin. He is certainly at risk for skin breakdown. Past medical  history includes atrial fibrillation, hyperlipidemia, coronary artery disease status post stent. He is on chronic Xarelto related to the atrial fibrillation ABI in INR clinic today was 1.21 Electronic Signature(s) Signed: 04/01/2020 5:09:23 PM By: Baltazar Najjar MD Entered By: Baltazar Najjar on 04/01/2020 17:02:03 -------------------------------------------------------------------------------- Physical Exam Details Patient Name: Date of Service: [name] Deforest, [name] 04/01/2020 2:45 PM Medical Record Number: [id number] Patient Account Number: [id number] Date of Birth/Sex: Treating RN: Jul 27, 1940 (79 y.o. Tammy Sours Primary Care Provider: Collie Siad Other Clinician: Referring Provider: Treating Provider/Extender: Gwendel Hanson Weeks in Treatment: 0 Constitutional Sitting or standing Blood Pressure is within target range for patient.. Pulse regular and within target range for patient.Marland Kitchen Respirations regular, non-labored and within target range.. Temperature is normal and within the target range for the patient.Marland Kitchen Appears in no distress. Respiratory work of breathing is normal. Cardiovascular His dorsalis pedis pulses are palpable but faint bilaterally. Severe bilateral nonpitting edema in his lower legs. Markedly erythematous but nontender skin. Diffuse loss of surface epithelium dry flaking skin. Integumentary (Hair, Skin) Lymphedema with changes of chronic venous stasis Berry. Notes Wound exam; there is really no open wound here. There is some weeping lymphedema on the left leg Electronic Signature(s) Signed: 04/01/2020 5:09:23 PM By: Baltazar Najjar MD Entered By: Baltazar Najjar on 04/01/2020 17:03:48 -------------------------------------------------------------------------------- Physician Orders Details Patient Name: Date of Service: [name] Deforest, [name] 04/01/2020 2:45 PM Medical Record Number: [id number] Patient Account Number: [id number] Date of Birth/Sex: Treating  RN: [birth year] (79 y.o. Tammy Sours Primary Care Provider: Collie Siad Other Clinician: Referring Provider: Treating Provider/Extender: Rusty Aus in Treatment: 0 Verbal / Phone Orders: No Diagnosis Coding ICD-10 Coding Code Description I89.0 Lymphedema, not elsewhere classified Follow-up Appointments Return Appointment in 1 week. Bathing/ Shower/ Hygiene May shower with protection but do not get wound dressing(s) wet. -  may use a case protector. Edema Control - Lymphedema / SCD / Other Bilateral Lower Extremities Elevate legs to the level of the heart or above for 30 minutes daily and/or when sitting, a frequency of: - 3-4 times throughout the day. Avoid standing for long periods of time. Exercise regularly Other Edema Control Orders/Instructions: - ***wound center to order Juxtalites HD for bilateral legs. patient to purchase and bring to this compression garments next week.*** ***IN CLINIC- Apply liberal TCA cream and apply 3 layer compression wrap with unna boot first layer to upper portion of lower leg. Electronic Signature(s) Signed: 04/01/2020 5:09:23 PM By: Baltazar Najjar MD Signed: 04/01/2020 5:54:37 PM By: Shawn Stall Entered By: Shawn Stall on 04/01/2020 16:47:38 -------------------------------------------------------------------------------- Problem List Details Patient Name: Date of Service: [name] Deforest, [name] 04/01/2020 2:45 PM Medical Record Number: [id number] Patient Account Number: [id number] Date of Birth/Sex: Treating RN: Jul 02, 1940 (79 y.o. Tammy Sours Primary Care Provider: Collie Siad Other Clinician: Referring Provider: Treating Provider/Extender: Gwendel Hanson Weeks in Treatment: 0 Active Problems ICD-10 Encounter Code Description Active Date MDM Diagnosis I89.0 Lymphedema, not elsewhere classified 04/01/2020 No Yes I87.323 Chronic venous hypertension (idiopathic) with inflammation of bilateral  lower 04/01/2020 No Yes extremity Inactive Problems Resolved Problems Electronic Signature(s) Signed: 04/01/2020 5:09:23 PM By: Baltazar Najjar MD Entered By: Baltazar Najjar on 04/01/2020 16:59:57 -------------------------------------------------------------------------------- Progress Note Details Patient Name: Date of Service: [name] Deforest, [name] 04/01/2020 2:45 PM Medical Record Number: [id number] Patient Account Number: [id number] Date of Birth/Sex: Treating RN: December 21, 1940 (79 y.o. Tammy Sours Primary Care Provider: Collie Siad Other Clinician: Referring Provider: Treating Provider/Extender: Daiva Huge, Norlene Duel in Treatment: 0 Subjective Chief Complaint Information obtained from Patient 04/01/2020; patient arrives in clinic today referred by his cardiologist Dr. Excell Seltzer for review of bilateral lower extremity lymphedema and venous insufficiency History of Present Illness (HPI) ADMISSION 04/01/2020 This is a 79 year old man who lives at home with his wife. He has a history of atrial fibrillation and coronary artery disease but does not have congestive heart failure. It is a bit difficult to get any history out of him he says that his legs have only been swollen for 3 or 4 months but this looks like much more of a chronic problem than that. He was recently prescribed cephalexin and TCA by his cardiologist and referred here. There is no real open wound here per se but there is weeping edema fluid especially in the left posterior calf, dry flaking skin. He is certainly at risk for skin breakdown. Past medical history includes atrial fibrillation, hyperlipidemia, coronary artery disease status post stent. He is on chronic Xarelto related to the atrial fibrillation ABI in INR clinic today was 1.21 Patient History Information obtained from Patient. Allergies No Known Allergies Family History Cancer - Mother, Heart Disease - Father, Hypertension - Father, No family  history of Diabetes, Hereditary Spherocytosis, Kidney Disease, Lung Disease, Seizures, Stroke, Thyroid Problems, Tuberculosis. Social History Former smoker, Marital Status - Married, Alcohol Use - Never, Drug Use - No History, Caffeine Use - Daily. Medical History Eyes Denies history of Cataracts, Glaucoma, Optic Neuritis Ear/Nose/Mouth/Throat Denies history of Chronic sinus problems/congestion, Middle ear problems Hematologic/Lymphatic Patient has history of Lymphedema Denies history of Anemia, Hemophilia, Human Immunodeficiency Virus, Sickle Cell Disease Respiratory Denies history of Aspiration, Asthma, Chronic Obstructive Pulmonary Disease (COPD), Pneumothorax, Sleep Apnea, Tuberculosis Cardiovascular Patient has history of Coronary Artery Disease, Hypertension Denies history of Angina, Arrhythmia, Congestive Heart Failure, Deep Vein Thrombosis, Hypotension, Myocardial Infarction, Peripheral  Arterial Disease, Peripheral Venous Disease, Phlebitis, Vasculitis Gastrointestinal Denies history of Cirrhosis , Colitis, Crohnoos, Hepatitis A, Hepatitis B, Hepatitis C Endocrine Denies history of Type I Diabetes, Type II Diabetes Genitourinary Denies history of End Stage Renal Disease Immunological Denies history of Lupus Erythematosus, Raynaudoos, Scleroderma Integumentary (Skin) Denies history of History of Burn Musculoskeletal Denies history of Gout, Rheumatoid Arthritis, Osteoarthritis, Osteomyelitis Neurologic Denies history of Dementia, Neuropathy, Quadriplegia, Paraplegia, Seizure Disorder Oncologic Denies history of Received Chemotherapy, Received Radiation Psychiatric Denies history of Anorexia/bulimia, Confinement Anxiety Review of Systems (ROS) Constitutional Symptoms (General Health) Denies complaints or symptoms of Fatigue, Fever, Chills, Marked Weight Change. Eyes Denies complaints or symptoms of Dry Eyes, Vision Changes, Glasses /  Contacts. Ear/Nose/Mouth/Throat Denies complaints or symptoms of Chronic sinus problems or rhinitis. Respiratory Denies complaints or symptoms of Chronic or frequent coughs, Shortness of Breath. Cardiovascular Denies complaints or symptoms of Chest pain. Gastrointestinal Denies complaints or symptoms of Frequent diarrhea, Nausea, Vomiting. Endocrine Denies complaints or symptoms of Heat/cold intolerance. Genitourinary Denies complaints or symptoms of Frequent urination. Integumentary (Skin) Complains or has symptoms of Wounds. Musculoskeletal Denies complaints or symptoms of Muscle Pain, Muscle Weakness. Neurologic Denies complaints or symptoms of Numbness/parasthesias. Psychiatric Denies complaints or symptoms of Claustrophobia, Suicidal. Objective Constitutional Sitting or standing Blood Pressure is within target range for patient.. Pulse regular and within target range for patient.Marland Kitchen. Respirations regular, non-labored and within target range.. Temperature is normal and within the target range for the patient.Marland Kitchen. Appears in no distress. Vitals Time Taken: 4:00 PM, Height: 71 in, Source: Stated, Weight: 170 lbs, Source: Stated, BMI: 23.7, Temperature: 99 F, Pulse: 111 bpm, Respiratory Rate: 18 breaths/min, Blood Pressure: 116/65 mmHg. Respiratory work of breathing is normal. Cardiovascular His dorsalis pedis pulses are palpable but faint bilaterally. Severe bilateral nonpitting edema in his lower legs. Markedly erythematous but nontender skin. Diffuse loss of surface epithelium dry flaking skin. General Notes: Wound exam; there is really no open wound here. There is some weeping lymphedema on the left leg Integumentary (Hair, Skin) Lymphedema with changes of chronic venous stasis Berry. Assessment Active Problems ICD-10 Lymphedema, not elsewhere classified Chronic venous hypertension (idiopathic) with inflammation of bilateral lower extremity Procedures There was a Three Layer  Compression Therapy Procedure by Fonnie MuBreedlove, Lauren, RN. Post procedure Diagnosis Wound #: Same as Pre-Procedure Plan Follow-up Appointments: Return Appointment in 1 week. Bathing/ Shower/ Hygiene: May shower with protection but do not get wound dressing(s) wet. - may use a case protector. Edema Control - Lymphedema / SCD / Other: Elevate legs to the level of the heart or above for 30 minutes daily and/or when sitting, a frequency of: - 3-4 times throughout the day. Avoid standing for long periods of time. Exercise regularly Other Edema Control Orders/Instructions: - ***wound center to order Juxtalites HD for bilateral legs. patient to purchase and bring to this compression garments next week.*** ***IN CLINIC- Apply liberal TCA cream and apply 3 layer compression wrap with unna boot first layer to upper portion of lower leg. 1. TCA and moisturizer 2. 3 layer compression wrap bilaterally 3. After discussion with the patient we ordered an bilateral juxta lite stockings he did not seem to have a problem with the cost. 4. If everything goes according to plan next week I will show him how to use the juxta lite stockings write a prescription for triamcinolone and Cetaphil to be applied nightly. At that point he can be discharged. He has not had a problem with nonhealing wounds however he is certainly at  risk of that if he does not control the swelling here. Electronic Signature(s) Signed: 04/01/2020 5:09:23 PM By: Baltazar Najjar MD Entered By: Baltazar Najjar on 04/01/2020 17:05:28 -------------------------------------------------------------------------------- HxROS Details Patient Name: Date of Service: [name] Deforest, [name] 04/01/2020 2:45 PM Medical Record Number: [id number] Patient Account Number: [id number] Date of Birth/Sex: Treating RN: August 21, 1940 (79 y.o. Melonie [place] Primary Care Provider: Collie Siad Other Clinician: Referring Provider: Treating Provider/Extender: Gwendel Hanson Weeks in Treatment: 0 Information Obtained From Patient Constitutional Symptoms (General Health) Complaints and Symptoms: Negative for: Fatigue; Fever; Chills; Marked Weight Change Eyes Complaints and Symptoms: Negative for: Dry Eyes; Vision Changes; Glasses / Contacts Medical History: Negative for: Cataracts; Glaucoma; Optic Neuritis Ear/Nose/Mouth/Throat Complaints and Symptoms: Negative for: Chronic sinus problems or rhinitis Medical History: Negative for: Chronic sinus problems/congestion; Middle ear problems Respiratory Complaints and Symptoms: Negative for: Chronic or frequent coughs; Shortness of Breath Medical History: Negative for: Aspiration; Asthma; Chronic Obstructive Pulmonary Disease (COPD); Pneumothorax; Sleep Apnea; Tuberculosis Cardiovascular Complaints and Symptoms: Negative for: Chest pain Medical History: Positive for: Coronary Artery Disease; Hypertension Negative for: Angina; Arrhythmia; Congestive Heart Failure; Deep Vein Thrombosis; Hypotension; Myocardial Infarction; Peripheral Arterial Disease; Peripheral Venous Disease; Phlebitis; Vasculitis Gastrointestinal Complaints and Symptoms: Negative for: Frequent diarrhea; Nausea; Vomiting Medical History: Negative for: Cirrhosis ; Colitis; Crohns; Hepatitis A; Hepatitis B; Hepatitis C Endocrine Complaints and Symptoms: Negative for: Heat/cold intolerance Medical History: Negative for: Type I Diabetes; Type II Diabetes Genitourinary Complaints and Symptoms: Negative for: Frequent urination Medical History: Negative for: End Stage Renal Disease Integumentary (Skin) Complaints and Symptoms: Positive for: Wounds Medical History: Negative for: History of Burn Musculoskeletal Complaints and Symptoms: Negative for: Muscle Pain; Muscle Weakness Medical History: Negative for: Gout; Rheumatoid Arthritis; Osteoarthritis; Osteomyelitis Neurologic Complaints and Symptoms: Negative  for: Numbness/parasthesias Medical History: Negative for: Dementia; Neuropathy; Quadriplegia; Paraplegia; Seizure Disorder Psychiatric Complaints and Symptoms: Negative for: Claustrophobia; Suicidal Medical History: Negative for: Anorexia/bulimia; Confinement Anxiety Hematologic/Lymphatic Medical History: Positive for: Lymphedema Negative for: Anemia; Hemophilia; Human Immunodeficiency Virus; Sickle Cell Disease Immunological Medical History: Negative for: Lupus Erythematosus; Raynauds; Scleroderma Oncologic Medical History: Negative for: Received Chemotherapy; Received Radiation Immunizations Pneumococcal Vaccine: Received Pneumococcal Vaccination: No Implantable Devices None Family and Social History Cancer: Yes - Mother; Diabetes: No; Heart Disease: Yes - Father; Hereditary Spherocytosis: No; Hypertension: Yes - Father; Kidney Disease: No; Lung Disease: No; Seizures: No; Stroke: No; Thyroid Problems: No; Tuberculosis: No; Former smoker; Marital Status - Married; Alcohol Use: Never; Drug Use: No History; Caffeine Use: Daily; Financial Concerns: No; Food, Clothing or Shelter Needs: No; Support System Lacking: No; Transportation Concerns: No Electronic Signature(s) Signed: 04/01/2020 4:55:20 PM By: [name] RN Signed: 04/01/2020 5:09:23 PM By: Baltazar Najjar MD Entered By: [name] on 04/01/2020 16:06:48 -------------------------------------------------------------------------------- SuperBill Details Patient Name: Date of Service: [name] Deforest, [name] 04/01/2020 Medical Record Number: [id number] Patient Account Number: [id number] Date of Birth/Sex: Treating RN: 1940-07-18 (79 y.o. Tammy Sours Primary Care Provider: Collie Siad Other Clinician: Referring Provider: Treating Provider/Extender: Gwendel Hanson Weeks in Treatment: 0 Diagnosis Coding ICD-10 Codes Code Description I89.0 Lymphedema, not elsewhere classified I87.323 Chronic venous  hypertension (idiopathic) with inflammation of bilateral lower extremity Facility Procedures CPT4: Code [id number] Description: 13 - WOUND CARE VISIT-LEV 3 EST PT Modifier: Quantity: 1 CPT4: [id number] Description: 81 BILATERAL: Application of multi-layer venous compression system; leg (below knee), including ankle and t. Modifier: Quantity: 1 Physician Procedures : CPT4 Code Description Modifier [id number] - WC PHYS LEVEL 2 -  NEW PT ICD-10 Diagnosis Description I89.0 Lymphedema, not elsewhere classified I87.323 Chronic venous hypertension (idiopathic) with inflammation of bilateral lower extremity Quantity: 1 Electronic Signature(s) Signed: 04/01/2020 5:09:23 PM By: Baltazar Najjar MD Entered By: Baltazar Najjar on 04/01/2020 17:06:06

## 2020-04-02 NOTE — Progress Notes (Addendum)
[place], [place] ([id number]) Visit Report for 04/01/2020 Allergy List Details Patient Name: Date of Service: [name] [name] 04/01/2020 2:45 PM Medical Record Number: [id number] Patient Account Number: [id number] Date of Birth/Sex: Treating RN: 12-Jun-1940 (79 y.o. Male) [name] Primary Care Charisse Wendell: Collie Siad Other Clinician: Referring Sheral Pfahler: Treating Mahealani Sulak/Extender: Daiva Huge, Zoe Weeks in Treatment: 0 Allergies Active Allergies No Known Allergies Allergy Notes Electronic Signature(s) Signed: 04/01/2020 4:55:20 PM By: [name] RN Entered By: [name] on 04/01/2020 16:02:05 -------------------------------------------------------------------------------- Arrival Information Details Patient Name: Date of Service: [name] Deforest, [name] [name] 04/01/2020 2:45 PM Medical Record Number: [id number] Patient Account Number: [id number] Date of Birth/Sex: Treating RN: [birth year] (79 y.o. Male) [name] Primary Care Yumna Ebers: Collie Siad Other Clinician: Referring Tayjon Halladay: Treating Kaisha Wachob/Extender: Rusty Aus in Treatment: 0 Visit Information Patient Arrived: Ambulatory Arrival Time: 15:59 Accompanied By: self Transfer Assistance: None Patient Identification Verified: Yes Secondary Verification Process Completed: Yes Patient Requires Transmission-Based Precautions: No Patient Has Alerts: No Electronic Signature(s) Signed: 04/01/2020 4:55:20 PM By: [name] RN Entered By: [name] on 04/01/2020 16:00:45 -------------------------------------------------------------------------------- Clinic Level of Care Assessment Details Patient Name: Date of Service: [name] [name] 04/01/2020 2:45 PM Medical Record Number: [id number] Patient Account Number: [id number] Date of Birth/Sex: Treating RN: 05-29-1940 (79 y.o. Male) Shawn Stall Primary Care Samnang Shugars: Collie Siad Other Clinician: Referring Akacia Boltz: Treating  Jaeley Wiker/Extender: Gwendel Hanson Weeks in Treatment: 0 Clinic Level of Care Assessment Items TOOL 1 Quantity Score X- 1 0 Use when EandM and Procedure is performed on INITIAL visit ASSESSMENTS - Nursing Assessment / Reassessment X- 1 20 General Physical Exam (combine w/ comprehensive assessment (listed just below) when performed on new pt. evals) X- 1 25 Comprehensive Assessment (HX, ROS, Risk Assessments, Wounds Hx, etc.) ASSESSMENTS - Wound and Skin Assessment / Reassessment X- 1 10 Dermatologic / Skin Assessment (not related to wound area) ASSESSMENTS - Ostomy and/or Continence Assessment and Care []  - 0 Incontinence Assessment and Management []  - 0 Ostomy Care Assessment and Management (repouching, etc.) PROCESS - Coordination of Care []  - 0 Simple Patient / Family Education for ongoing care X- 1 20 Complex (extensive) Patient / Family Education for ongoing care X- 1 10 Staff obtains , Records, T Results / Process Orders est []  - 0 Staff telephones HHA, Nursing Homes / Clarify orders / etc []  - 0 Routine Transfer to another Facility (non-emergent condition) []  - 0 Routine Hospital Admission (non-emergent condition) X- 1 15 New Admissions / / Ordering NPWT Apligraf, etc. , []  - 0 Emergency Hospital Admission (emergent condition) PROCESS - Special Needs []  - 0 Pediatric / Minor Patient Management []  - 0 Isolation Patient Management []  - 0 Hearing / Language / Visual special needs []  - 0 Assessment of Community assistance (transportation, D/C planning, etc.) []  - 0 Additional assistance / Altered mentation []  - 0 Support Surface(s) Assessment (bed, cushion, seat, etc.) INTERVENTIONS - Miscellaneous []  - 0 External ear exam []  - 0 Patient Transfer (multiple staff / / Similar devices) []  - 0 Simple Staple / Suture removal (25 or less) []  - 0 Complex Staple / Suture removal (26 or more) []  -  0 Hypo/Hyperglycemic Management ([name] not check if billed separately) X- 1 15 Ankle / Brachial Index (ABI) - [name] not check if billed separately Has the patient been seen at the hospital within the last three years: Yes Total Score: 115 Level Of Care: New/Established - Level 3 Electronic Signature(s) Signed:  04/01/2020 5:54:37 PM By: Shawn Stall Entered By: Shawn Stall on 04/01/2020 17:04:45 -------------------------------------------------------------------------------- Compression Therapy Details Patient Name: Date of Service: [name] [name] 04/01/2020 2:45 PM Medical Record Number: [id number] Patient Account Number: [id number] Date of Birth/Sex: Treating RN: Feb 08, 1941 (79 y.o. Male) Shawn Stall Primary Care Dalyla Chui: Collie Siad Other Clinician: Referring Charlea Nardo: Treating Evelin Cake/Extender: Gwendel Hanson Weeks in Treatment: 0 Compression Therapy Performed for Wound Assessment: NonWound Condition Lymphedema - Bilateral Leg Performed By: Clinician Fonnie Mu, RN Compression Type: Three Layer Post Procedure Diagnosis Same as Pre-procedure Electronic Signature(s) Signed: 04/01/2020 5:54:37 PM By: Shawn Stall Entered By: Shawn Stall on 04/01/2020 16:40:35 -------------------------------------------------------------------------------- Encounter Discharge Information Details Patient Name: Date of Service: [name] Deforest, [name] [name] 04/01/2020 2:45 PM Medical Record Number: [id number] Patient Account Number: [id number] Date of Birth/Sex: Treating RN: [birth year] (79 y.o. Male) Zandra Abts Primary Care Kalisi Bevill: Collie Siad Other Clinician: Referring Ayslin Kundert: Treating Dub Maclellan/Extender: Rusty Aus in Treatment: 0 Encounter Discharge Information Items Discharge Condition: Stable Ambulatory Status: Ambulatory Discharge Destination: Home Transportation: Private Auto Accompanied By: alone Schedule Follow-up Appointment:  Yes Clinical Summary of Care: Patient Declined Electronic Signature(s) Signed: 04/02/2020 4:47:23 PM By: Zandra Abts RN, BSN Entered By: Zandra Abts on 04/01/2020 17:04:27 -------------------------------------------------------------------------------- Lower Extremity Assessment Details Patient Name: Date of Service: [name] [name] 04/01/2020 2:45 PM Medical Record Number: [id number] Patient Account Number: [id number] Date of Birth/Sex: Treating RN: 02-13-1941 (79 y.o. Male) [name] Primary Care Jovannie Ulibarri: Collie Siad Other Clinician: Referring Soleia Badolato: Treating Shrita Thien/Extender: Daiva Huge, Zoe Weeks in Treatment: 0 Edema Assessment Assessed: [Left: No] [Right: No] E[Left: dema] [Right: :] Calf Left: Right: Point of Measurement: 43 cm From Medial Instep 43 cm Ankle Left: Right: Point of Measurement: 12 cm From Medial Instep 29 cm Vascular Assessment Blood Pressure: Brachial: [Left:116] Ankle: [Left:Dorsalis Pedis: 140 1.21] Electronic Signature(s) Signed: 04/01/2020 4:55:20 PM By: [name] RN Entered By: [name] on 04/01/2020 16:18:07 -------------------------------------------------------------------------------- Multi-Disciplinary Care Plan Details Patient Name: Date of Service: [name] Deforest, [name] [name] 04/01/2020 2:45 PM Medical Record Number: [id number] Patient Account Number: [id number] Date of Birth/Sex: Treating RN: 05-Mar-1941 (79 y.o. Male) Shawn Stall Primary Care Shanesha Bednarz: Collie Siad Other Clinician: Referring Donnivan Villena: Treating Gustie Bobb/Extender: Daiva Huge, Clement Sayres Weeks in Treatment: 0 Active Inactive Abuse / Safety / Falls / Self Care Management Nursing Diagnoses: History of Falls Potential for falls Goals: Patient will not experience any injury related to falls Date Initiated: 04/01/2020 Target Resolution Date: 05/07/2020 Goal Status: Active Patient/caregiver will verbalize understanding of skin care  regimen Date Initiated: 04/01/2020 Target Resolution Date: 05/07/2020 Goal Status: Active Interventions: Provide education on fall prevention Notes: Nutrition Nursing Diagnoses: Potential for alteratiion in Nutrition/Potential for imbalanced nutrition Goals: Patient/caregiver agrees to and verbalizes understanding of need to use nutritional supplements and/or vitamins as prescribed Date Initiated: 04/01/2020 T arget Resolution Date: 05/07/2020 Goal Status: Active Interventions: Provide education on nutrition Notes: Orientation to the Wound Care Program Nursing Diagnoses: Knowledge deficit related to the wound healing center program Goals: Patient/caregiver will verbalize understanding of the Wound Healing Center Program Date Initiated: 04/01/2020 Target Resolution Date: 05/07/2020 Goal Status: Active Interventions: Provide education on orientation to the wound center Notes: Electronic Signature(s) Signed: 04/01/2020 5:54:37 PM By: Shawn Stall Entered By: Shawn Stall on 04/01/2020 16:06:07 -------------------------------------------------------------------------------- Non-Wound Condition Assessment Details Patient Name: Date of Service: [name] [name] 04/01/2020 2:45 PM Medical Record Number: [id number] Patient Account Number: [id number] Date of Birth/Sex: Treating RN: 03-28-1941 (79 y.o. Male) Shawn Stall Primary  Care Ilanna Deihl: Collie Siad Other Clinician: Referring Luann Aspinwall: Treating Casey Maxfield/Extender: Gwendel Hanson Weeks in Treatment: 0 Non-Wound Condition: Condition: Lymphedema Location: Leg Side: Bilateral Photos Notes cobblestone, dry, flaky skin appearance. With areas of weeping areas noted. Electronic Signature(s) Signed: 04/06/2020 3:05:03 PM By: Benjaman Kindler EMT/HBOT/SD Signed: 04/06/2020 4:58:51 PM By: Shawn Stall Previous Signature: 04/01/2020 5:54:37 PM Version By: Shawn Stall Entered By: Benjaman Kindler on 04/06/2020  09:45:32 -------------------------------------------------------------------------------- Pain Assessment Details Patient Name: Date of Service: [name] Deforest, [name] [name] 04/01/2020 2:45 PM Medical Record Number: [id number] Patient Account Number: [id number] Date of Birth/Sex: Treating RN: 1940-06-29 (79 y.o. Male) Shawn Stall Primary Care Hanford Lust: Collie Siad Other Clinician: Referring Jomaira Darr: Treating Kemper Heupel/Extender: Gwendel Hanson Weeks in Treatment: 0 Active Problems Location of Pain Severity and Description of Pain Patient Has Paino No Site Locations Rate the pain. Current Pain Level: 0 Pain Management and Medication Current Pain Management: Medication: No Cold Application: No Rest: No Massage: No Activity: No T.E.[name].S.: No Heat Application: No Leg drop or elevation: No Is the Current Pain Management Adequate: Adequate How does your wound impact your activities of daily livingo Sleep: No Bathing: No Appetite: No Relationship With Others: No Bladder Continence: No Emotions: No Bowel Continence: No Work: No Toileting: No Drive: No Dressing: No Hobbies: No Electronic Signature(s) Signed: 04/01/2020 5:54:37 PM By: Shawn Stall Entered By: Shawn Stall on 04/01/2020 16:37:45 -------------------------------------------------------------------------------- Patient/Caregiver Education Details Patient Name: Date of Service: [name] [name] 12/2/2021andnbsp2:45 PM Medical Record Number: [id number] Patient Account Number: [id number] Date of Birth/Gender: Treating RN: May 08, 1940 (79 y.o. Male) Shawn Stall Primary Care Physician: Collie Siad Other Clinician: Referring Physician: Treating Physician/Extender: Rusty Aus in Treatment: 0 Education Assessment Education Provided To: Patient Education Topics Provided Nutrition: Handouts: Nutrition Methods: Explain/Verbal, Printed Responses: Reinforcements needed Welcome  [facility]: o Handouts: Welcome T [facility] o Methods: Explain/Verbal, Printed Responses: Reinforcements needed Electronic Signature(s) Signed: 04/01/2020 5:54:37 PM By: Shawn Stall Entered By: Shawn Stall on 04/01/2020 16:06:34 -------------------------------------------------------------------------------- Vitals Details Patient Name: Date of Service: [name] Deforest, [name] [name] 04/01/2020 2:45 PM Medical Record Number: [id number] Patient Account Number: [id number] Date of Birth/Sex: Treating RN: 05/21/1940 (79 y.o. Male) [name] Primary Care Jordyan Hardiman: Collie Siad Other Clinician: Referring Avaleigh Decuir: Treating Ondria Oswald/Extender: Gwendel Hanson Weeks in Treatment: 0 Vital Signs Time Taken: 16:00 Temperature (F): 99 Height (in): 71 Pulse (bpm): 111 Source: Stated Respiratory Rate (breaths/min): 18 Weight (lbs): 170 Blood Pressure (mmHg): 116/65 Source: Stated Reference Range: 80 - 120 mg / dl Body Mass Index (BMI): 23.7 Electronic Signature(s) Signed: 04/01/2020 4:55:20 PM By: [name] RN Entered By: [name] on 04/01/2020 16:01:54

## 2020-04-08 ENCOUNTER — Other Ambulatory Visit: Payer: Self-pay

## 2020-04-08 ENCOUNTER — Encounter (HOSPITAL_BASED_OUTPATIENT_CLINIC_OR_DEPARTMENT_OTHER): Payer: Medicare Other | Admitting: Internal Medicine

## 2020-04-08 DIAGNOSIS — I4891 Unspecified atrial fibrillation: Secondary | ICD-10-CM | POA: Diagnosis not present

## 2020-04-08 DIAGNOSIS — I2510 Atherosclerotic heart disease of native coronary artery without angina pectoris: Secondary | ICD-10-CM | POA: Diagnosis not present

## 2020-04-08 DIAGNOSIS — L97812 Non-pressure chronic ulcer of other part of right lower leg with fat layer exposed: Secondary | ICD-10-CM | POA: Diagnosis not present

## 2020-04-08 DIAGNOSIS — I890 Lymphedema, not elsewhere classified: Secondary | ICD-10-CM | POA: Diagnosis not present

## 2020-04-08 DIAGNOSIS — Z7901 Long term (current) use of anticoagulants: Secondary | ICD-10-CM | POA: Diagnosis not present

## 2020-04-08 DIAGNOSIS — I872 Venous insufficiency (chronic) (peripheral): Secondary | ICD-10-CM | POA: Diagnosis not present

## 2020-04-08 DIAGNOSIS — E785 Hyperlipidemia, unspecified: Secondary | ICD-10-CM | POA: Diagnosis not present

## 2020-04-08 DIAGNOSIS — I87323 Chronic venous hypertension (idiopathic) with inflammation of bilateral lower extremity: Secondary | ICD-10-CM | POA: Diagnosis not present

## 2020-04-08 NOTE — Progress Notes (Signed)
[place], [place] ([id number]) Visit Report for 04/08/2020 HPI Details Patient Name: Date of Service: [name] [name] 04/08/2020 2:30 PM Medical Record Number: [id number] Patient Account Number: [id number] Date of Birth/Sex: Treating RN: 24-Feb-1941 (79 y.o. Tammy Sours Primary Care Provider: Collie Siad Other Clinician: Referring Provider: Treating Provider/Extender: [name] [name] Weeks in Treatment: 1 History of Present Illness HPI Description: ADMISSION 04/01/2020 This is a 79 year old man who lives at home with his wife. He has a history of atrial fibrillation and coronary artery disease but does not have congestive heart failure. It is a bit difficult to get any history out of him he says that his legs have only been swollen for 3 or 4 months but this looks like much more of a chronic problem than that. He was recently prescribed cephalexin and TCA by his cardiologist and referred here. There is no real open wound here per se but there is weeping edema fluid especially in the left posterior calf, dry flaking skin. He is certainly at risk for skin breakdown. Past medical history includes atrial fibrillation, hyperlipidemia, coronary artery disease status post stent. He is on chronic Xarelto related to the atrial fibrillation ABI in INR clinic today was 1.21 12/9; patient comes in with a lot less edema in his legs and hands there is much less weeping edema. He still has some areas in the right leg and the left posterior calf that are weeping. He did not seem to get the juxta lite stockings we ordered for him last week we will look into this Electronic Signature(s) Signed: 04/08/2020 3:37:20 PM By: Baltazar Najjar MD Entered By: Baltazar Najjar on 04/08/2020 15:18:12 -------------------------------------------------------------------------------- Physical Exam Details Patient Name: Date of Service: [name] Deforest, [name] 04/08/2020 2:30 PM Medical Record Number: [id number] Patient  Account Number: [id number] Date of Birth/Sex: Treating RN: [birth year] (79 y.o. Tammy Sours Primary Care Provider: Collie Siad Other Clinician: Referring Provider: Treating Provider/Extender: [name] [name] Weeks in Treatment: 1 Constitutional Patient is hypertensive.. Pulse regular and within target range for patient.Marland Kitchen Respirations regular, non-labored and within target range.. Temperature is normal and within the target range for the patient.Marland Kitchen Appears in no distress. Cardiovascular Needle pulses are palpable. Much left edema fluid. Notes Wound exam; he has no open wound. 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 This is a lot better than last week. Still a disturbing amount of venous stasis skin changes. Usually with TCA and reduction of edema this fades but his has not really done this. Electronic Signature(s) Signed: 04/08/2020 3:37:20 PM By: Baltazar Najjar MD Entered By: Baltazar Najjar on 04/08/2020 15:19:54 -------------------------------------------------------------------------------- Physician Orders Details Patient Name: Date of Service: [name] Deforest, [name] 04/08/2020 2:30 PM Medical Record Number: [id number] Patient Account Number: [id number] Date of Birth/Sex: Treating RN: [birth year] (79 y.o. [name] [name] Primary Care Provider: Collie Siad Other Clinician: Referring Provider: Treating Provider/Extender: [name] [name] in Treatment: 1 Verbal / Phone Orders: No Diagnosis Coding Follow-up Appointments Return Appointment in 1 week. Bathing/ Shower/ Hygiene May shower with protection but do not get wound dressing(s) wet. - may use a case protector. Edema Control - Lymphedema / SCD / Other Bilateral Lower Extremities Elevate legs to the level of the heart or above for 30 minutes daily and/or when sitting, a frequency of: - 3-4 times throughout the day. Avoid standing for long periods of  time. Exercise regularly Other Edema Control Orders/Instructions: - ***wound center to order Juxtalites HD for bilateral legs.  patient to purchase and bring to this compression garments next week.*** ***IN CLINIC- Apply liberal TCA cream and apply 4 layer compression wrap with unna boot first layer to upper portion of lower leg. Electronic Signature(s) Signed: 04/08/2020 3:27:39 PM By: Fonnie Mu RN Signed: 04/08/2020 3:37:20 PM By: Baltazar Najjar MD Entered By: Fonnie Mu on 04/08/2020 15:14:49 -------------------------------------------------------------------------------- Problem List Details Patient Name: Date of Service: [name] Deforest, [name] 04/08/2020 2:30 PM Medical Record Number: [id number] Patient Account Number: [id number] Date of Birth/Sex: Treating RN: 1940/12/22 (79 y.o. Tammy Sours Primary Care Provider: Collie Siad Other Clinician: Referring Provider: Treating Provider/Extender: [name] [name] Weeks in Treatment: 1 Active Problems ICD-10 Encounter Code Description Active Date MDM Diagnosis I89.0 Lymphedema, not elsewhere classified 04/01/2020 No Yes I87.323 Chronic venous hypertension (idiopathic) with inflammation of bilateral lower 04/01/2020 No Yes extremity Inactive Problems Resolved Problems Electronic Signature(s) Signed: 04/08/2020 3:37:20 PM By: Baltazar Najjar MD Entered By: Baltazar Najjar on 04/08/2020 15:17:34 -------------------------------------------------------------------------------- Progress Note Details Patient Name: Date of Service: [name] Deforest, [name] 04/08/2020 2:30 PM Medical Record Number: [id number] Patient Account Number: [id number] Date of Birth/Sex: Treating RN: 03-27-1941 (79 y.o. Tammy Sours Primary Care Provider: Collie Siad Other Clinician: Referring Provider: Treating Provider/Extender: [name] [name] Weeks in Treatment: 1 Subjective History of Present Illness  (HPI) ADMISSION 04/01/2020 This is a 79 year old man who lives at home with his wife. He has a history of atrial fibrillation and coronary artery disease but does not have congestive heart failure. It is a bit difficult to get any history out of him he says that his legs have only been swollen for 3 or 4 months but this looks like much more of a chronic problem than that. He was recently prescribed cephalexin and TCA by his cardiologist and referred here. There is no real open wound here per se but there is weeping edema fluid especially in the left posterior calf, dry flaking skin. He is certainly at risk for skin breakdown. Past medical history includes atrial fibrillation, hyperlipidemia, coronary artery disease status post stent. He is on chronic Xarelto related to the atrial fibrillation ABI in INR clinic today was 1.21 12/9; patient comes in with a lot less edema in his legs and hands there is much less weeping edema. He still has some areas in the right leg and the left posterior calf that are weeping. He did not seem to get the juxta lite stockings we ordered for him last week we will look into this Objective Constitutional Patient is hypertensive.. Pulse regular and within target range for patient.Marland Kitchen Respirations regular, non-labored and within target range.. Temperature is normal and within the target range for the patient.Marland Kitchen Appears in no distress. Vitals Time Taken: 2:27 PM, Height: 71 in, Weight: 170 lbs, BMI: 23.7, Temperature: 98.3 F, Pulse: 90 bpm, Respiratory Rate: 18 breaths/min, Blood Pressure: 150/76 mmHg. Cardiovascular Needle pulses are palpable. Much left edema fluid. General Notes: Wound exam; he has no open wound. 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 This is a lot better than last week. Still a disturbing amount of venous stasis skin changes. Usually with TCA and reduction of edema this fades but his has not really done  this. Assessment Active Problems ICD-10 Lymphedema, not elsewhere classified Chronic venous hypertension (idiopathic) with inflammation of bilateral lower extremity Procedures There was a Four Layer Compression Therapy Procedure by Fonnie Mu, RN. Post procedure Diagnosis Wound #: Same as Pre-Procedure Plan Follow-up Appointments: Return Appointment in 1  week. Bathing/ Shower/ Hygiene: May shower with protection but do not get wound dressing(s) wet. - may use a case protector. Edema Control - Lymphedema / SCD / Other: Elevate legs to the level of the heart or above for 30 minutes daily and/or when sitting, a frequency of: - 3-4 times throughout the day. Avoid standing for long periods of time. Exercise regularly Other Edema Control Orders/Instructions: - ***wound center to order Juxtalites HD for bilateral legs. patient to purchase and bring to this compression garments next week.*** ***IN CLINIC- Apply liberal TCA cream and apply 4 layer compression wrap with unna boot first layer to upper portion of lower leg. 1. Liberal TCA in this time 4-layer compression bilaterally. He handled the 3 layer well without complaints 2. I plan to discharge him next week to his own juxta lite stockings were going to try to look into why this did not connect this week Electronic Signature(s) Signed: 04/08/2020 3:37:20 PM By: Baltazar Najjar MD Entered By: Baltazar Najjar on 04/08/2020 15:20:31 -------------------------------------------------------------------------------- SuperBill Details Patient Name: Date of Service: [name] Deforest, [name] 04/08/2020 Medical Record Number: [id number] Patient Account Number: [id number] Date of Birth/Sex: Treating RN: Mar 28, 1941 (79 y.o. [name] [name] Primary Care Provider: Collie Siad Other Clinician: Referring Provider: Treating Provider/Extender: [name] [name] Weeks in Treatment: 1 Diagnosis Coding ICD-10 Codes Code Description I89.0  Lymphedema, not elsewhere classified I87.323 Chronic venous hypertension (idiopathic) with inflammation of bilateral lower extremity Facility Procedures CPT4: Code [id number] foot Description: 1 BILATERAL: Application of multi-layer venous compression system; leg (below knee), including ankle and . Modifier: Quantity: 1 Physician Procedures : CPT4 Code Description Modifier [id number] - WC PHYS LEVEL 3 - EST PT ICD-10 Diagnosis Description I89.0 Lymphedema, not elsewhere classified I87.323 Chronic venous hypertension (idiopathic) with inflammation of bilateral lower extremity Quantity: 1 Electronic Signature(s) Signed: 04/08/2020 3:37:20 PM By: Baltazar Najjar MD Entered By: Baltazar Najjar on 04/08/2020 15:20:59

## 2020-04-09 ENCOUNTER — Encounter (HOSPITAL_BASED_OUTPATIENT_CLINIC_OR_DEPARTMENT_OTHER): Payer: Medicare Other | Admitting: Internal Medicine

## 2020-04-09 NOTE — Progress Notes (Signed)
[name] [name], [place] ([id number]) Visit Report for 04/08/2020 Arrival Information Details Patient Name: Date of Service: [name] [name] 04/08/2020 2:30 PM Medical Record Number: [id number] Patient Account Number: [id number] Date of Birth/Sex: Treating RN: 11/25/1940 (79 y.o. Tammy Sours Primary Care Provider: Collie Siad Other Clinician: Referring Provider: Treating Provider/Extender: Rusty Aus in Treatment: 1 Visit Information History Since Last Visit Added or deleted any medications: No Patient Arrived: Ambulatory Any new allergies or adverse reactions: No Arrival Time: 14:22 Had a fall or experienced change in No Accompanied By: self activities of daily living that may affect Transfer Assistance: None risk of falls: Patient Identification Verified: Yes Signs or symptoms of abuse/neglect since last visito No Secondary Verification Process Completed: Yes Hospitalized since last visit: No Patient Requires Transmission-Based Precautions: No Implantable device outside of the clinic excluding No Patient Has Alerts: No cellular tissue based products placed in the center since last visit: Has Dressing in Place as Prescribed: Yes Pain Present Now: No Electronic Signature(s) Signed: 04/09/2020 9:52:05 AM By: Karl Ito Entered By: Karl Ito on 04/08/2020 14:26:38 -------------------------------------------------------------------------------- Compression Therapy Details Patient Name: Date of Service: [name] Deforest, [name] 04/08/2020 2:30 PM Medical Record Number: [id number] Patient Account Number: [id number] Date of Birth/Sex: Treating RN: April 01, 1941 (79 y.o. [name] [name] Primary Care Provider: Collie Siad Other Clinician: Referring Provider: Treating Provider/Extender: Gwendel Hanson Weeks in Treatment: 1 Compression Therapy Performed for Wound Assessment: NonWound Condition Lymphedema - Bilateral Leg Performed By:  Clinician Fonnie Mu, RN Compression Type: Four Layer Post Procedure Diagnosis Same as Pre-procedure Electronic Signature(s) Signed: 04/08/2020 3:27:39 PM By: Fonnie Mu RN Entered By: Fonnie Mu on 04/08/2020 15:14:12 -------------------------------------------------------------------------------- Encounter Discharge Information Details Patient Name: Date of Service: [name] Deforest, [name] 04/08/2020 2:30 PM Medical Record Number: [id number] Patient Account Number: [id number] Date of Birth/Sex: Treating RN: [birth year] (79 y.o. [name] [name] Primary Care Provider: Collie Siad Other Clinician: Referring Provider: Treating Provider/Extender: Rusty Aus in Treatment: 1 Encounter Discharge Information Items Discharge Condition: Stable Ambulatory Status: Ambulatory Discharge Destination: Home Transportation: Private Auto Accompanied By: alone Schedule Follow-up Appointment: Yes Clinical Summary of Care: Patient Declined Electronic Signature(s) Signed: 04/08/2020 4:05:55 PM By: Zandra Abts RN, BSN Entered By: Zandra Abts on 04/08/2020 16:04:26 -------------------------------------------------------------------------------- Lower Extremity Assessment Details Patient Name: Date of Service: [name] Deforest, [name] 04/08/2020 2:30 PM Medical Record Number: [id number] Patient Account Number: [id number] Date of Birth/Sex: Treating RN: [birth year] (79 y.o. [name] [name] Primary Care Provider: Collie Siad Other Clinician: Referring Provider: Treating Provider/Extender: Gwendel Hanson Weeks in Treatment: 1 Edema Assessment Assessed: [Left: No] [Right: No] Edema: [Left: Yes] [Right: Yes] Calf Left: Right: Point of Measurement: 43 cm From Medial Instep 39.5 cm 38 cm Ankle Left: Right: Point of Measurement: 12 cm From Medial Instep 27.4 cm 26.4 cm Knee To Floor Left: Right: From Medial Instep 42 cm 42 cm Vascular  Assessment Pulses: Dorsalis Pedis Palpable: [Left:No] [Right:No] Electronic Signature(s) Signed: 04/08/2020 4:07:56 PM By: Zenaida Deed RN, BSN Entered By: Zenaida Deed on 04/08/2020 14:53:16 -------------------------------------------------------------------------------- Multi-Disciplinary Care Plan Details Patient Name: Date of Service: [name] Deforest, [name] 04/08/2020 2:30 PM Medical Record Number: [id number] Patient Account Number: [id number] Date of Birth/Sex: Treating RN: 09/10/1940 (79 y.o. [name] [name] Primary Care Provider: Collie Siad Other Clinician: Referring Provider: Treating Provider/Extender: Rusty Aus in Treatment: 1 Active Inactive Abuse / Safety / Falls / Self Care Management Nursing Diagnoses: History of Falls Potential for falls Goals:  Patient will not experience any injury related to falls Date Initiated: 04/01/2020 Target Resolution Date: 05/07/2020 Goal Status: Active Patient/caregiver will verbalize understanding of skin care regimen Date Initiated: 04/01/2020 Target Resolution Date: 05/07/2020 Goal Status: Active Interventions: Provide education on fall prevention Notes: Nutrition Nursing Diagnoses: Potential for alteratiion in Nutrition/Potential for imbalanced nutrition Goals: Patient/caregiver agrees to and verbalizes understanding of need to use nutritional supplements and/or vitamins as prescribed Date Initiated: 04/01/2020 T arget Resolution Date: 05/07/2020 Goal Status: Active Interventions: Provide education on nutrition Treatment Activities: Education provided on Nutrition : 04/01/2020 Notes: Electronic Signature(s) Signed: 04/08/2020 3:27:39 PM By: Fonnie Mu RN Entered By: Fonnie Mu on 04/08/2020 15:15:22 -------------------------------------------------------------------------------- Non-Wound Condition Assessment Details Patient Name: Date of Service: [name] Deforest, [name] 04/08/2020 2:30  PM Medical Record Number: [id number] Patient Account Number: [id number] Date of Birth/Sex: Treating RN: 11-04-1940 (79 y.o. [name] [name] Primary Care Provider: Collie Siad Other Clinician: Referring Provider: Treating Provider/Extender: Gwendel Hanson Weeks in Treatment: 1 Non-Wound Condition: Condition: Lymphedema Location: Leg Side: Bilateral Notes dry, flaking skin Electronic Signature(s) Signed: 04/08/2020 4:07:56 PM By: Zenaida Deed RN, BSN Entered By: Zenaida Deed on 04/08/2020 14:53:58 -------------------------------------------------------------------------------- Pain Assessment Details Patient Name: Date of Service: [name] Deforest, [name] 04/08/2020 2:30 PM Medical Record Number: [id number] Patient Account Number: [id number] Date of Birth/Sex: Treating RN: 1941-04-11 (79 y.o. Tammy Sours Primary Care Provider: Collie Siad Other Clinician: Referring Provider: Treating Provider/Extender: Gwendel Hanson Weeks in Treatment: 1 Active Problems Location of Pain Severity and Description of Pain Patient Has Paino No Site Locations Pain Management and Medication Current Pain Management: Electronic Signature(s) Signed: 04/08/2020 4:11:46 PM By: Shawn Stall Signed: 04/09/2020 9:52:05 AM By: Karl Ito Entered By: Karl Ito on 04/08/2020 14:28:02 -------------------------------------------------------------------------------- Patient/Caregiver Education Details Patient Name: Date of Service: [name] [name] 12/9/2021andnbsp2:30 PM Medical Record Number: [id number] Patient Account Number: [id number] Date of Birth/Gender: Treating RN: February 17, 1941 (79 y.o. [name] [name] Primary Care Physician: Collie Siad Other Clinician: Referring Physician: Treating Physician/Extender: Rusty Aus in Treatment: 1 Education Assessment Education Provided To: Patient Education Topics  Provided Nutrition: Methods: Explain/Verbal Responses: State content correctly Electronic Signature(s) Signed: 04/08/2020 3:27:39 PM By: Fonnie Mu RN Entered By: Fonnie Mu on 04/08/2020 15:15:37 -------------------------------------------------------------------------------- Vitals Details Patient Name: Date of Service: [name] Deforest, [name] 04/08/2020 2:30 PM Medical Record Number: [id number] Patient Account Number: [id number] Date of Birth/Sex: Treating RN: 04/03/1941 (79 y.o. Tammy Sours Primary Care Provider: Collie Siad Other Clinician: Referring Provider: Treating Provider/Extender: Gwendel Hanson Weeks in Treatment: 1 Vital Signs Time Taken: 14:27 Temperature (F): 98.3 Height (in): 71 Pulse (bpm): 90 Weight (lbs): 170 Respiratory Rate (breaths/min): 18 Body Mass Index (BMI): 23.7 Blood Pressure (mmHg): 150/76 Reference Range: 80 - 120 mg / dl Electronic Signature(s) Signed: 04/09/2020 9:52:05 AM By: Karl Ito Entered By: Karl Ito on 04/08/2020 14:27:56

## 2020-04-15 ENCOUNTER — Encounter (HOSPITAL_BASED_OUTPATIENT_CLINIC_OR_DEPARTMENT_OTHER): Payer: Medicare Other | Admitting: Internal Medicine

## 2020-04-16 ENCOUNTER — Encounter (HOSPITAL_BASED_OUTPATIENT_CLINIC_OR_DEPARTMENT_OTHER): Payer: Medicare Other | Admitting: Internal Medicine

## 2020-04-20 ENCOUNTER — Encounter (HOSPITAL_BASED_OUTPATIENT_CLINIC_OR_DEPARTMENT_OTHER): Payer: Medicare Other | Admitting: Internal Medicine

## 2020-04-21 ENCOUNTER — Encounter (HOSPITAL_BASED_OUTPATIENT_CLINIC_OR_DEPARTMENT_OTHER): Payer: Medicare Other | Admitting: Physician Assistant

## 2020-04-21 ENCOUNTER — Other Ambulatory Visit: Payer: Self-pay

## 2020-04-21 DIAGNOSIS — L97812 Non-pressure chronic ulcer of other part of right lower leg with fat layer exposed: Secondary | ICD-10-CM | POA: Diagnosis not present

## 2020-04-21 DIAGNOSIS — I872 Venous insufficiency (chronic) (peripheral): Secondary | ICD-10-CM | POA: Diagnosis not present

## 2020-04-21 DIAGNOSIS — Z7901 Long term (current) use of anticoagulants: Secondary | ICD-10-CM | POA: Diagnosis not present

## 2020-04-21 DIAGNOSIS — E785 Hyperlipidemia, unspecified: Secondary | ICD-10-CM | POA: Diagnosis not present

## 2020-04-21 DIAGNOSIS — I890 Lymphedema, not elsewhere classified: Secondary | ICD-10-CM | POA: Diagnosis not present

## 2020-04-21 DIAGNOSIS — I4891 Unspecified atrial fibrillation: Secondary | ICD-10-CM | POA: Diagnosis not present

## 2020-04-21 DIAGNOSIS — I2510 Atherosclerotic heart disease of native coronary artery without angina pectoris: Secondary | ICD-10-CM | POA: Diagnosis not present

## 2020-04-22 NOTE — Progress Notes (Signed)
DERIDDER, [place] ([id number]) Visit Report for 04/21/2020 Arrival Information Details Patient Name: Date of Service: [name] [name] 04/21/2020 11:00 A M Medical Record Number: [id number] Patient Account Number: [id number] Date of Birth/Sex: Treating RN: December 25, 1940 (79 y.o. Elizebeth Koller Primary Care Gaege Sangalang: Collie Siad Other Clinician: Referring Luis Nickles: Treating Shambhavi Salley/Extender: Neil Crouch, Zoe Weeks in Treatment: 2 Visit Information History Since Last Visit Added or deleted any medications: No Patient Arrived: Ambulatory Any new allergies or adverse reactions: No Arrival Time: 11:04 Had a fall or experienced change in No Accompanied By: self activities of daily living that may affect Transfer Assistance: None risk of falls: Patient Identification Verified: Yes Signs or symptoms of abuse/neglect since last visito No Secondary Verification Process Completed: Yes Hospitalized since last visit: No Patient Requires Transmission-Based Precautions: No Implantable device outside of the clinic excluding No Patient Has Alerts: No cellular tissue based products placed in the center since last visit: Has Dressing in Place as Prescribed: Yes Pain Present Now: No Electronic Signature(s) Signed: 04/22/2020 3:41:52 PM By: Karl Ito Entered By: Karl Ito on 04/21/2020 11:04:58 -------------------------------------------------------------------------------- Clinic Level of Care Assessment Details Patient Name: Date of Service: [name] [name] 04/21/2020 11:00 A M Medical Record Number: [id number] Patient Account Number: [id number] Date of Birth/Sex: Treating RN: 1940-05-10 (79 y.o. Elizebeth Koller Primary Care Delano Frate: Collie Siad Other Clinician: Referring Emmary Culbreath: Treating Rhyanna Sorce/Extender: Neil Crouch, Zoe Weeks in Treatment: 2 Clinic Level of Care Assessment Items TOOL 4 Quantity Score X- 1 0 Use when only an EandM is  performed on FOLLOW-UP visit ASSESSMENTS - Nursing Assessment / Reassessment X- 1 10 Reassessment of Co-morbidities (includes updates in patient status) X- 1 5 Reassessment of Adherence to Treatment Plan ASSESSMENTS - Wound and Skin A ssessment / Reassessment []  - 0 Simple Wound Assessment / Reassessment - one wound []  - 0 Complex Wound Assessment / Reassessment - multiple wounds X- 1 10 Dermatologic / Skin Assessment (not related to wound area) ASSESSMENTS - Focused Assessment []  - 0 Circumferential Edema Measurements - multi extremities []  - 0 Nutritional Assessment / Counseling / Intervention X- 1 5 Lower Extremity Assessment (monofilament, tuning fork, pulses) []  - 0 Peripheral Arterial Disease Assessment (using hand held doppler) ASSESSMENTS - Ostomy and/or Continence Assessment and Care []  - 0 Incontinence Assessment and Management []  - 0 Ostomy Care Assessment and Management (repouching, etc.) PROCESS - Coordination of Care X - Simple Patient / Family Education for ongoing care 1 15 []  - 0 Complex (extensive) Patient / Family Education for ongoing care X- 1 10 Staff obtains , Records, T Results / Process Orders est []  - 0 Staff telephones HHA, Nursing Homes / Clarify orders / etc []  - 0 Routine Transfer to another Facility (non-emergent condition) []  - 0 Routine Hospital Admission (non-emergent condition) []  - 0 New Admissions / / Ordering NPWT Apligraf, etc. , []  - 0 Emergency Hospital Admission (emergent condition) X- 1 10 Simple Discharge Coordination []  - 0 Complex (extensive) Discharge Coordination PROCESS - Special Needs []  - 0 Pediatric / Minor Patient Management []  - 0 Isolation Patient Management []  - 0 Hearing / Language / Visual special needs []  - 0 Assessment of Community assistance (transportation, D/C planning, etc.) []  - 0 Additional assistance / Altered mentation []  - 0 Support Surface(s) Assessment  (bed, cushion, seat, etc.) INTERVENTIONS - Wound Cleansing / Measurement []  - 0 Simple Wound Cleansing - one wound []  - 0 Complex Wound Cleansing - multiple wounds []  -  0 Wound Imaging (photographs - any number of wounds) []  - 0 Wound Tracing (instead of photographs) []  - 0 Simple Wound Measurement - one wound []  - 0 Complex Wound Measurement - multiple wounds INTERVENTIONS - Wound Dressings []  - 0 Small Wound Dressing one or multiple wounds []  - 0 Medium Wound Dressing one or multiple wounds []  - 0 Large Wound Dressing one or multiple wounds []  - 0 Application of Medications - topical []  - 0 Application of Medications - injection INTERVENTIONS - Miscellaneous []  - 0 External ear exam []  - 0 Specimen Collection (cultures, biopsies, blood, body fluids, etc.) []  - 0 Specimen(s) / Culture(s) sent or taken to Lab for analysis []  - 0 Patient Transfer (multiple staff / / Similar devices) []  - 0 Simple Staple / Suture removal (25 or less) []  - 0 Complex Staple / Suture removal (26 or more) []  - 0 Hypo / Hyperglycemic Management (close monitor of Blood Glucose) []  - 0 Ankle / Brachial Index (ABI) - do not check if billed separately X- 1 5 Vital Signs Has the patient been seen at the hospital within the last three years: Yes Total Score: 70 Level Of Care: New/Established - Level 2 Electronic Signature(s) Signed: 04/21/2020 7:00:57 PM By: RN, BSN Entered By: on 04/21/2020 17:41:04 -------------------------------------------------------------------------------- Encounter Discharge Information Details Patient Name: Date of Service: , [name] 04/21/2020 11:00 A M Medical Record Number: Patient Account Number: Date of Birth/Sex: Treating RN: 04/07/1941 (79 y.o. ) Nurse, adult Primary Care Dejia Ebron: Other Clinician: Referring Keyshia Orwick: Treating Amonie Wisser/Extender: ,  Zoe Weeks in Treatment: 2 Encounter Discharge Information Items Discharge Condition: Stable Ambulatory Status: Ambulatory Discharge Destination: Home Transportation: Private Auto Accompanied By: self Schedule Follow-up Appointment: Yes Clinical Summary of Care: Patient Declined Notes instructed on use application of juxtalite on in the am off in the pm, patient verbalizes understanding. Electronic Signature(s) Signed: 04/21/2020 5:14:06 PM By: RN Entered By: 04/23/2020 on 04/21/2020 12:33:59 -------------------------------------------------------------------------------- Lower Extremity Assessment Details Patient Name: Date of Service: Zandra Abts 04/21/2020 11:00 A M Medical Record Number: [name] Patient Account Number: 04/23/2020 Date of Birth/Sex: Treating RN: 14-Jul-1940 (79 y.o. 09/26/1940 Primary Care Kort Stettler: 02-26-1980 Other Clinician: Referring Ellicia Alix: Treating Tesha Archambeau/Extender: Judie Petit, Zoe Weeks in Treatment: 2 Edema Assessment Assessed: [Left: Yes] [Right: Yes] Edema: [Left: Yes] [Right: Yes] Calf Left: Right: Point of Measurement: 43 cm From Medial Instep 37 cm 37 cm Ankle Left: Right: Point of Measurement: 12 cm From Medial Instep 26 cm 27 cm Vascular Assessment Pulses: Dorsalis Pedis Palpable: [Left:Yes] [Right:Yes] Electronic Signature(s) Signed: 04/21/2020 5:18:14 PM By: Collie Siad Entered By: Neil Crouch on 04/21/2020 11:19:38 -------------------------------------------------------------------------------- Multi-Disciplinary Care Plan Details Patient Name: Date of Service: [name], [name] 04/21/2020 11:00 A M Medical Record Number: 04/23/2020 Patient Account Number: [name] [name] Date of Birth/Sex: Treating RN: Oct 24, 1940 (79 y.o. [id number] Primary Care Dahir Ayer: 09/26/1940 Other Clinician: Referring Tyshon Fanning: Treating Celise Bazar/Extender: 76, Zoe Weeks in  Treatment: 2 Active Inactive Electronic Signature(s) Signed: 04/21/2020 7:00:57 PM By: Collie Siad RN, BSN Entered By: Neil Crouch on 04/21/2020 17:40:12 -------------------------------------------------------------------------------- Non-Wound Condition Assessment Details Patient Name: Date of Service: Shawn Stall, [name] 04/21/2020 11:00 A M Medical Record Number: 04/23/2020 Patient Account Number: [name] Date of Birth/Sex: Treating RN: 1941-03-22 (79 y.o. [id number] Primary Care Kelsei Defino: 09/26/1940 Other Clinician: Referring Jahdai Padovano: Treating Tessa Seaberry/Extender: 76,  Theophilus Bones, Zoe Weeks in Treatment: 2 Non-Wound Condition: Condition: Lymphedema Location: Leg Side: Bilateral Notes dry, flaking skin, cobblestone appearance BLE. Electronic Signature(s) Signed: 04/21/2020 5:18:14 PM By: Shawn Stall Entered By: Shawn Stall on 04/21/2020 11:20:11 -------------------------------------------------------------------------------- Pain Assessment Details Patient Name: Date of Service: [name] [name] 04/21/2020 11:00 A M Medical Record Number: [id number] Patient Account Number: [id number] Date of Birth/Sex: Treating RN: [birth year] (79 y.o. Elizebeth Koller Primary Care Kaisei Gilbo: Collie Siad Other Clinician: Referring Lyndell Gillyard: Treating Lj Miyamoto/Extender: Neil Crouch, Zoe Weeks in Treatment: 2 Active Problems Location of Pain Severity and Description of Pain Patient Has Paino No Site Locations Pain Management and Medication Current Pain Management: Electronic Signature(s) Signed: 04/21/2020 7:00:57 PM By: Zandra Abts RN, BSN Signed: 04/22/2020 3:41:52 PM By: Karl Ito Entered By: Karl Ito on 04/21/2020 11:06:35 -------------------------------------------------------------------------------- Patient/Caregiver Education Details Patient Name: Date of Service: FREEMA N, [name] 12/22/2021andnbsp11:00 A M Medical  Record Number: [id number] Patient Account Number: [id number] Date of Birth/Gender: Treating RN: 1940/09/26 (79 y.o. Elizebeth Koller Primary Care Physician: Collie Siad Other Clinician: Referring Physician: Treating Physician/Extender: Olena Leatherwood Weeks in Treatment: 2 Education Assessment Education Provided To: Patient Education Topics Provided Venous: Methods: Explain/Verbal Responses: State content correctly Wound/Skin Impairment: Methods: Explain/Verbal Responses: State content correctly Electronic Signature(s) Signed: 04/21/2020 7:00:57 PM By: Zandra Abts RN, BSN Entered By: Zandra Abts on 04/21/2020 17:40:30 -------------------------------------------------------------------------------- Vitals Details Patient Name: Date of Service: [name], [name] 04/21/2020 11:00 A M Medical Record Number: [id number] Patient Account Number: [id number] Date of Birth/Sex: Treating RN: 1940/10/17 (79 y.o. Elizebeth Koller Primary Care Bernetha Anschutz: Collie Siad Other Clinician: Referring Ernestine Langworthy: Treating Tonya Wantz/Extender: Neil Crouch, Zoe Weeks in Treatment: 2 Vital Signs Time Taken: 11:04 Temperature (F): 97.5 Height (in): 71 Pulse (bpm): 77 Weight (lbs): 170 Respiratory Rate (breaths/min): 18 Body Mass Index (BMI): 23.7 Blood Pressure (mmHg): 167/93 Reference Range: 80 - 120 mg / dl Electronic Signature(s) Signed: 04/22/2020 3:41:52 PM By: Karl Ito Entered By: Karl Ito on 04/21/2020 11:06:31

## 2020-04-22 NOTE — Progress Notes (Signed)
[place], [place] ([id number]) Visit Report for 04/21/2020 Chief Complaint Document Details Patient Name: Date of Service: Scarlette Slice 04/21/2020 11:00 A M Medical Record Number: [id number] Patient Account Number: [id number] Date of Birth/Sex: Treating RN: 1941/02/15 (79 y.o. Elizebeth Koller Primary Care Provider: Collie Siad Other Clinician: Referring Provider: Treating Provider/Extender: Neil Crouch, Zoe Weeks in Treatment: 2 Information Obtained from: Patient Chief Complaint 04/01/2020; patient arrives in clinic today referred by his cardiologist Dr. Excell Seltzer for review of bilateral lower extremity lymphedema and venous insufficiency Electronic Signature(s) Signed: 04/21/2020 11:21:55 AM By: Lenda Kelp PA-C Entered By: Lenda Kelp on 04/21/2020 11:21:55 -------------------------------------------------------------------------------- HPI Details Patient Name: Date of Service: [name], [name] 04/21/2020 11:00 A M Medical Record Number: [id number] Patient Account Number: [id number] Date of Birth/Sex: Treating RN: [birth year] (79 y.o. Elizebeth Koller Primary Care Provider: Collie Siad Other Clinician: Referring Provider: Treating Provider/Extender: Olena Leatherwood Weeks in Treatment: 2 History of Present Illness HPI Description: ADMISSION 04/01/2020 This is a 79 year old man who lives at home with his wife. He has a history of atrial fibrillation and coronary artery disease but does not have congestive heart failure. It is a bit difficult to get any history out of him he says that his legs have only been swollen for 3 or 4 months but this looks like much more of a chronic problem than that. He was recently prescribed cephalexin and TCA by his cardiologist and referred here. There is no real open wound here per se but there is weeping edema fluid especially in the left posterior calf, dry flaking skin. He is certainly at risk for skin  breakdown. Past medical history includes atrial fibrillation, hyperlipidemia, coronary artery disease status post stent. He is on chronic Xarelto related to the atrial fibrillation ABI in INR clinic today was 1.21 12/9; patient comes in with a lot less edema in his legs and hands there is much less weeping edema. He still has some areas in the right leg and the left posterior calf that are weeping. He did not seem to get the juxta lite stockings we ordered for him last week we will look into this 04/21/2020 upon evaluation today patient appears to be doing well with regard to his legs he still has a lot of dry skin that I think he should be using some triamcinolone on he will be able to use this daily at home now that he has a juxta light compression unfortunately only got 1 of these were to be working on getting the other 1. Other than not overall things seem to be doing quite well with no open wounds currently. Electronic Signature(s) Signed: 04/21/2020 12:18:23 PM By: Lenda Kelp PA-C Entered By: Lenda Kelp on 04/21/2020 12:18:22 -------------------------------------------------------------------------------- Physical Exam Details Patient Name: Date of Service: [name], [name] 04/21/2020 11:00 A M Medical Record Number: [id number] Patient Account Number: [id number] Date of Birth/Sex: Treating RN: 1940/05/14 (79 y.o. Elizebeth Koller Primary Care Provider: Collie Siad Other Clinician: Referring Provider: Treating Provider/Extender: Neil Crouch, Zoe Weeks in Treatment: 2 Constitutional Well-nourished and well-hydrated in no acute distress. Respiratory normal breathing without difficulty. Psychiatric this patient is able to make decisions and demonstrates good insight into disease process. Alert and Oriented x 3. pleasant and cooperative. Notes Upon inspection patient's legs show no signs of open wounds which is great news and overall I think that we are fine to  go ahead and discharge him at this  point he does have one of the juxta light compression he did not get the other. However we have called about getting that for him now. With that being said we will show him how to utilize the one currently and then again he will be able to put the other on whenever he gets it. I think this is good to be the best way to go. Patient is in agreement with that plan. Electronic Signature(s) Signed: 04/21/2020 12:18:53 PM By: Lenda KelpStone III, Zyan Mirkin PA-C Entered By: Lenda KelpStone III, Ula Couvillon on 04/21/2020 12:18:53 -------------------------------------------------------------------------------- Physician Orders Details Patient Name: Date of Service: Almyra DeforestFREEMA N, [name] 04/21/2020 11:00 A M Medical Record Number: [id number] Patient Account Number: [id number] Date of Birth/Sex: Treating RN: Oct 26, 1940 (79 y.o. Elizebeth KollerM) Lynch, Shatara Primary Care Provider: Collie SiadStallings, Zoe Other Clinician: Referring Provider: Treating Provider/Extender: Neil CrouchStone III, Matas Burrows Stallings, Zoe Weeks in Treatment: 2 Verbal / Phone Orders: No Diagnosis Coding ICD-10 Coding Code Description I89.0 Lymphedema, not elsewhere classified I87.323 Chronic venous hypertension (idiopathic) with inflammation of bilateral lower extremity [facility] Edema Control - Lymphedema / SCD / Other Bilateral Lower Extremities Elevate legs to the level of the heart or above for 30 minutes daily and/or when sitting, a frequency of: - throughout the day Avoid standing for long periods of time. Exercise regularly Moisturize legs daily. - Also apply thin layer of Triamcinolone to both legs daily before bed. Compression stocking or Garment 30-40 mm/Hg pressure to: - Juxtalite to both legs daily. Apply first thing in the morning, remove at night before bed. Other Edema Control Orders/Instructions: - ACE wrap to left leg daily until Juxtalite available. Patient Medications llergies: No Known  Allergies A Notifications Medication Indication Start End 04/21/2020 triamcinolone acetonide DOSE topical 0.1 % ointment - ointment topical applied in a thin film to both legs at night after taking off your compression before going to bed x 30 days Electronic Signature(s) Signed: 04/21/2020 12:21:05 PM By: Lenda KelpStone III, Rasheen Bells PA-C Entered By: Lenda KelpStone III, Roselia Snipe on 04/21/2020 12:21:04 -------------------------------------------------------------------------------- Problem List Details Patient Name: Date of Service: Almyra DeforestFREEMA N, [name] 04/21/2020 11:00 A M Medical Record Number: [id number] Patient Account Number: [id number] Date of Birth/Sex: Treating RN: Oct 26, 1940 (79 y.o. Elizebeth KollerM) Lynch, Shatara Primary Care Provider: Collie SiadStallings, Zoe Other Clinician: Referring Provider: Treating Provider/Extender: Neil CrouchStone III, Roanne Haye Stallings, Zoe Weeks in Treatment: 2 Active Problems ICD-10 Encounter Code Description Active Date MDM Diagnosis I89.0 Lymphedema, not elsewhere classified 04/01/2020 No Yes I87.323 Chronic venous hypertension (idiopathic) with inflammation of bilateral lower 04/01/2020 No Yes extremity Inactive Problems Resolved Problems Electronic Signature(s) Signed: 04/21/2020 11:19:56 AM By: Lenda KelpStone III, Zackarey Holleman PA-C Entered By: Lenda KelpStone III, Naesha Buckalew on 04/21/2020 11:19:55 -------------------------------------------------------------------------------- Progress Note Details Patient Name: Date of Service: Almyra DeforestFREEMA N, [name] 04/21/2020 11:00 A M Medical Record Number: [id number] Patient Account Number: [id number] Date of Birth/Sex: Treating RN: Oct 26, 1940 (79 y.o. Elizebeth KollerM) Lynch, Shatara Primary Care Provider: Collie SiadStallings, Zoe Other Clinician: Referring Provider: Treating Provider/Extender: Neil CrouchStone III, Christyan Reger Stallings, Clement SayresZoe Weeks in Treatment: 2 Subjective Chief Complaint Information obtained from Patient 04/01/2020; patient arrives in clinic today referred by his cardiologist Dr. Excell Seltzerooper for review of bilateral  lower extremity lymphedema and venous insufficiency History of Present Illness (HPI) ADMISSION 04/01/2020 This is a 79 year old man who lives at home with his wife. He has a history of atrial fibrillation and coronary artery disease but does not have congestive heart failure. It is a bit difficult to get any history out of him he says that his  legs have only been swollen for 3 or 4 months but this looks like much more of a chronic problem than that. He was recently prescribed cephalexin and TCA by his cardiologist and referred here. There is no real open wound here per se but there is weeping edema fluid especially in the left posterior calf, dry flaking skin. He is certainly at risk for skin breakdown. Past medical history includes atrial fibrillation, hyperlipidemia, coronary artery disease status post stent. He is on chronic Xarelto related to the atrial fibrillation ABI in INR clinic today was 1.21 12/9; patient comes in with a lot less edema in his legs and hands there is much less weeping edema. He still has some areas in the right leg and the left posterior calf that are weeping. He did not seem to get the juxta lite stockings we ordered for him last week we will look into this 04/21/2020 upon evaluation today patient appears to be doing well with regard to his legs he still has a lot of dry skin that I think he should be using some triamcinolone on he will be able to use this daily at home now that he has a juxta light compression unfortunately only got 1 of these were to be working on getting the other 1. Other than not overall things seem to be doing quite well with no open wounds currently. Objective Constitutional Well-nourished and well-hydrated in no acute distress. Vitals Time Taken: 11:04 AM, Height: 71 in, Weight: 170 lbs, BMI: 23.7, Temperature: 97.5 F, Pulse: 77 bpm, Respiratory Rate: 18 breaths/min, Blood Pressure: 167/93 mmHg. Respiratory normal breathing without  difficulty. Psychiatric this patient is able to make decisions and demonstrates good insight into disease process. Alert and Oriented x 3. pleasant and cooperative. General Notes: Upon inspection patient's legs show no signs of open wounds which is great news and overall I think that we are fine to go ahead and discharge him at this point he does have one of the juxta light compression he did not get the other. However we have called about getting that for him now. With that being said we will show him how to utilize the one currently and then again he will be able to put the other on whenever he gets it. I think this is good to be the best way to go. Patient is in agreement with that plan. Other Condition(s) Patient presents with Lymphedema located on the Bilateral Leg. General Notes: dry, flaking skin, cobblestone appearance BLE. Assessment Active Problems ICD-10 Lymphedema, not elsewhere classified Chronic venous hypertension (idiopathic) with inflammation of bilateral lower extremity Plan Discharge From [facility] Services: Discharge from Wound Care Center Edema Control - Lymphedema / SCD / Other: Elevate legs to the level of the heart or above for 30 minutes daily and/or when sitting, a frequency of: - throughout the day Avoid standing for long periods of time. Exercise regularly Moisturize legs daily. - Also apply thin layer of Triamcinolone to both legs daily before bed. Compression stocking or Garment 30-40 mm/Hg pressure to: - Juxtalite to both legs daily. Apply first thing in the morning, remove at night before bed. Other Edema Control Orders/Instructions: - ACE wrap to left leg daily until Juxtalite available. The following medication(s) was prescribed: triamcinolone acetonide topical 0.1 % ointment ointment topical applied in a thin film to both legs at night after taking off your compression before going to bed x 30 days starting 04/21/2020 1. Would recommend currently that we go  ahead and continue  with the wound care measures as before and the patient is in agreement of the plan that includes the use of the juxta light compression on the bilateral lower extremities right now we will show him how to put it on 1 and will use an Ace wrap for the other until he gets the other juxta light compression. 2. I am can recommend that he utilize triamcinolone ointment over the legs bilaterally to help with moisturizing and helping with the dry and irritated skin I Minna send this in for him as a prescription which will be a much larger quantity than what he currently has. 3. I am also can recommend he elevate his legs much as possible try to keep edema under good control. We will see him back for follow-up visit as needed. Electronic Signature(s) Signed: 04/21/2020 12:21:33 PM By: Lenda Kelp PA-C Previous Signature: 04/21/2020 12:19:42 PM Version By: Lenda Kelp PA-C Entered By: Lenda Kelp on 04/21/2020 12:21:32 -------------------------------------------------------------------------------- SuperBill Details Patient Name: Date of Service: [name], [name] 04/21/2020 Medical Record Number: [id number] Patient Account Number: [id number] Date of Birth/Sex: Treating RN: April 26, 1941 (79 y.o. Elizebeth Koller Primary Care Provider: Collie Siad Other Clinician: Referring Provider: Treating Provider/Extender: Neil Crouch, Zoe Weeks in Treatment: 2 Diagnosis Coding ICD-10 Codes Code Description I89.0 Lymphedema, not elsewhere classified I87.323 Chronic venous hypertension (idiopathic) with inflammation of bilateral lower extremity Facility Procedures CPT4 Code: [id number] Description: [phone] - WOUND CARE VISIT-LEV 2 EST PT Modifier: Quantity: 1 Physician Procedures : CPT4 Code Description Modifier [id number] - WC PHYS LEVEL 4 - EST PT ICD-10 Diagnosis Description I89.0 Lymphedema, not elsewhere classified I87.323 Chronic venous hypertension  (idiopathic) with inflammation of bilateral lower extremity Quantity: 1 Electronic Signature(s) Signed: 04/21/2020 7:00:57 PM By: Zandra Abts RN, BSN Signed: 04/22/2020 4:47:28 PM By: Lenda Kelp PA-C Previous Signature: 04/21/2020 12:22:03 PM Version By: Lenda Kelp PA-C Entered By: Zandra Abts on 04/21/2020 17:41:10

## 2020-06-02 ENCOUNTER — Telehealth: Payer: Self-pay | Admitting: Cardiovascular Disease

## 2020-06-02 NOTE — Telephone Encounter (Signed)
Left message to call back  

## 2020-06-02 NOTE — Telephone Encounter (Signed)
Pt c/o medication issue:  1. Name of Medication: Eliquis  2. How are you currently taking this medication (dosage and times per day)? 2 times a day  3. Are you having a reaction (difficulty breathing--STAT)?  4. What is your medication issue? It is not helping his lower leg I, he wonder if he needs to go back on Xarelto?

## 2020-08-12 NOTE — Telephone Encounter (Signed)
I have attempted to call the patient several times. I was able to leave a message today. Instructed the patient to call back if he is having problems/concerns/questions.

## 2021-03-10 ENCOUNTER — Other Ambulatory Visit: Payer: Self-pay | Admitting: Cardiovascular Disease

## 2021-03-10 NOTE — Telephone Encounter (Deleted)
Prescription refill request for Eliquis received. Indication: Afib  Last office visit: 02/02/20([place])  Scr:0.95 (03/05/20) Age: 80 Weight: 81.6kg  Pt overdue for office visit and labs. Message sent to scheduler.

## 2021-03-14 ENCOUNTER — Telehealth: Payer: Self-pay

## 2021-03-14 DIAGNOSIS — Z5181 Encounter for therapeutic drug level monitoring: Secondary | ICD-10-CM

## 2021-03-14 DIAGNOSIS — I4891 Unspecified atrial fibrillation: Secondary | ICD-10-CM

## 2021-03-14 NOTE — Telephone Encounter (Signed)
Labs and office visit scheduled for Eliquis refill.

## 2021-03-14 NOTE — Progress Notes (Signed)
Cardiology Office Note    Date:  03/15/2021   ID:  [name] [name], DOB 1941-03-08, MRN [id number]   PCP:  Doristine Bosworth, MD   [facility]  Cardiologist:  Tonny Bollman, MD   Advanced Practice Provider:  No care team member to display Electrophysiologist:  None   [id number]   Chief Complaint  Patient presents with   Follow-up    History of Present Illness:  [name] [name] is a 80 y.o. male with a hx of chronic atrial fibrillation, presenting for follow-up evaluation.  The patient has been chronically anticoagulated with rivaroxaban.  He has had some problems with medication intolerances and feels that he has had a drug rash on his arms.  When he was seen last 1 year ago, he was transition from metoprolol succinate to long-acting diltiazem.   Patient last saw Dr. Excell Seltzer 02/02/2020 and was having a lot of leg swelling and chronic stasis dermatitis.  He was given information on the lounge doctor device and referred him to the wound care center.  Blood pressure was stable off all medications.  Heart rate control was borderline but was intolerant to beta-blockers and calcium channel blockers because of rash.  No changes made.  2D echo ordered and showed normal LV function.  Severe left atrial enlargement no major valvular abnormalities.  Patient comes in for yearly f/u. Denies chest pain, dyspnea, dizziness or presyncope. Discharged from wound center last and swelling is a little better but still weeping wounds. Getting extra salt in diet-frozen dinners. Walks 1 mile 3-4 days/week.BP has been 120-130's at home. 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 and sits at a desk most of the day with   Past Medical History:  Diagnosis Date   CAD (coronary artery disease)    LHC 11/06: pLAD 90, pD1 30, oD2 25 with inf branch 60; septal perf ostial 95; prox MOM 25, RCA 25, oPL2 95, EF 65 >> PCI:  BMS to proximal LAD    FH: CAD (coronary artery disease)     History of echocardiogram    Echo 3/17:  Mod LVH, EF 55-60, no RWMA, mild AI, mild to mod RAE   History of tobacco abuse    Hyperlipidemia    Persistent atrial fibrillation (HCC)    Xarelto for a/c    Past Surgical History:  Procedure Laterality Date   CARDIAC CATHETERIZATION     CORONARY STENT PLACEMENT     stents of the proximal lad with bare metal stent   PERCUTANEOUS CORONARY STENT INTERVENTION (PCI-S)      Current Medications: No outpatient medications have been marked as taking for the 03/15/21 encounter (Office Visit) with Dyann Kief, PA-C.     Allergies:   Patient has no known allergies.   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  Tobacco Use   Smoking status: Former    Types: Cigarettes    Quit date: 05/10/1978    Years since quitting: 42.8   Smokeless tobacco: Never  Vaping Use   Vaping Use: Never used  Substance and Sexual Activity   Alcohol use: No   Drug use: No   Sexual activity: Not on file  Other Topics Concern   Not on file  Social History Narrative   Not on file   Social Determinants of Health   Financial Resource Strain: Not on file  Food Insecurity: Not on file  Transportation Needs: Not on file  Physical Activity: Not on file  Stress: Not on file  Social Connections: Not on file     Family History:  The patient's  family history includes Cancer - Colon in his mother; Heart attack in his father; Hypertension in his father.   ROS:   Please see the history of present illness.    ROS All other systems reviewed and are negative.   PHYSICAL EXAM:   VS:  BP 130/80   Pulse 100   Ht  (1.803 m)   SpO2 90%   BMI 25.10 kg/m   Physical Exam  GEN: Well nourished, well developed, in no acute  distress  Neck: no JVD, carotid bruits, or masses Cardiac:irreg irreg 100/m 2/6 systolic, no rubs, or gallops  Respiratory:  clear to auscultation bilaterally, normal work of breathing GI: soft, nontender, nondistended, + BS Ext: severe edema with weeping wounds/ulcerations  Neuro:  Alert and Oriented x 3, Strength and sensation are intact Psych: euthymic mood, full affect  Wt Readings from Last 3 Encounters:  02/02/20 180 lb (81.6 kg)  11/12/19 180 lb (81.6 kg)  01/02/19 178 lb (80.7 kg)      Studies/Labs Reviewed:   EKG:  EKG is  ordered today.  The ekg ordered today demonstrates Afib 100/m nonspecific ST changes, no acute change  Recent Labs: No results found for requested labs within last 8760 hours.   Lipid Panel    Component Value Date/Time   CHOL 202 (H) 08/02/2015 1055   TRIG 50.0 08/02/2015 1055   HDL 61.70 08/02/2015 1055   CHOLHDL 3 08/02/2015 1055   VLDL 10.0 08/02/2015 1055   LDLCALC 130 (H) 08/02/2015 1055   LDLDIRECT 190.0 01/23/2008 1110    Additional studies/ records that were reviewed today include:  2D echo 02/17/2020 IMPRESSIONS     1. Left ventricular ejection fraction, by estimation, is 65 to 70%. The  left ventricle has normal function. The left ventricle has no regional  wall motion abnormalities. Left ventricular diastolic parameters are  indeterminate.   2. Right ventricular systolic function is normal. The right ventricular  size is normal.   3. Left atrial size was severely dilated.   4. The mitral valve is normal in structure. No evidence of mitral valve  regurgitation. No evidence of mitral stenosis.   5. The aortic valve is tricuspid. There is mild calcification of the  aortic valve. There is mild thickening of the aortic valve. Aortic valve  regurgitation is mild to moderate. No aortic stenosis is present. Aortic  regurgitation PHT measures 491 msec.   6. Aortic dilatation noted. There is mild to moderate dilatation of the   ascending aorta, measuring 44 mm.    Risk Assessment/Calculations:    CHA2DS2-VASc Score = 2   This indicates a 2.2% annual risk of stroke. The patient's score is based upon: CHF History: 0 HTN History: 0 Diabetes History: 0 Stroke History: 0 Vascular Disease History: 0 Age Score: 2 Gender Score: 0       ASSESSMENT:    1. Permanent atrial fibrillation (HCC)   2. Essential hypertension   3. Other hyperlipidemia   4. Venous stasis dermatitis of both lower extremities      PLAN:  In order of problems  listed above:  Permanent atrial fibrillation on apixaban.  Not on beta-blocker or CCB due to intolerance. Borderline heart rate control but patient asymptomatic. Continue eliquis-no bleeding problems, update labs.  Hypertension controlled off medications  Hyperlipidemia-not on meds. Check FLP  Venous stasis dermatitis-significant edema and weeping ulcers and wounds. Will refer back to wound center. Leg elevation is difficult since he still works full time as an Art gallery manager.  Shared Decision Making/Informed Consent        Medication Adjustments/Labs and Tests Ordered: Current medicines are reviewed at length with the patient today.  Concerns regarding medicines are outlined above.  Medication changes, Labs and Tests ordered today are listed in the Patient Instructions below. Patient Instructions  Medication Instructions:  Your physician recommends that you continue on your current medications as directed. Please refer to the Current Medication list given to you today.  *If you need a refill on your cardiac medications before your next appointment, please call your pharmacy*   Lab Work: TODAY: CBC, CMET, TSH If you have labs (blood work) drawn today and your tests are completely normal, you will receive your results only by: MyChart Message (if you have MyChart) OR A paper copy in the mail If you have any lab test that is abnormal or we need to change your treatment, we  will call you to review the results.   Testing/Procedures: NONE   Follow-Up: At [facility], you and your health needs are our priority.  As part of our continuing mission to provide you with exceptional heart care, we have created designated Provider Care Teams.  These Care Teams include your primary Cardiologist (physician) and Advanced Practice Providers (APPs -  Physician Assistants and Nurse Practitioners) who all work together to provide you with the care you need, when you need it.  We recommend signing up for the patient portal called "MyChart".  Sign up information is provided on this After Visit Summary.  MyChart is used to connect with patients for Virtual Visits (Telemedicine).  Patients are able to view lab/test results, encounter notes, upcoming appointments, etc.  Non-urgent messages can be sent to your provider as well.   To learn more about what you can do with MyChart, go to [email or web address].    Your next appointment:   6 month(s)  The format for your next appointment:   In Person  Provider:   Tonny Bollman, MD {   You have been referred to THE WOUND CLINIC. THEIR OFFICE WILL CALL AND SCHEDULE AN APPOINTMENT.  OTHER Two Gram Sodium Diet 2000 mg  What is Sodium? Sodium is a mineral found naturally in many foods. The most significant source of sodium in the diet is table salt, which is about 40% sodium.  Processed, convenience, and preserved foods also contain a large amount of sodium.  The body needs only 500 mg of sodium daily to function,  A normal diet provides more than enough sodium even if you do not use salt.  Why Limit Sodium? A build up of sodium in the body can cause thirst, increased blood pressure, shortness of breath, and water retention.  Decreasing sodium in the diet can reduce edema and risk of heart attack or stroke associated with high blood pressure.  Keep in mind that there are many other factors involved in these health problems.   Heredity, obesity, lack of exercise, cigarette smoking, stress and what you eat all play a role.  General Guidelines: Do not add salt at the table or in cooking.  One teaspoon of salt contains over 2 grams of sodium. Read food labels Avoid processed and convenience foods Ask your dietitian before eating any foods not dicussed in the menu planning guidelines Consult your physician if you wish to use a salt substitute or a sodium containing medication such as antacids.  Limit milk and milk products to 16 oz (2 cups) per day.  Shopping Hints: READ LABELS!! "Dietetic" does not necessarily mean low sodium. Salt and other sodium ingredients are often added to foods during processing.    Menu Planning Guidelines Food Group Choose More Often Avoid  Beverages (see also the milk group All fruit juices, low-sodium, salt-free vegetables juices, low-sodium carbonated beverages Regular vegetable or tomato juices, commercially softened water used for drinking or cooking  Breads and Cereals Enriched white, wheat, rye and pumpernickel bread, hard rolls and dinner rolls; muffins, cornbread and waffles; most dry cereals, cooked cereal without added salt; unsalted crackers and breadsticks; low sodium or homemade bread crumbs Bread, rolls and crackers with salted tops; quick breads; instant hot cereals; pancakes; commercial bread stuffing; self-rising flower and biscuit mixes; regular bread crumbs or cracker crumbs  Desserts and Sweets Desserts and sweets mad with mild should be within allowance Instant pudding mixes and cake mixes  Fats Butter or margarine; vegetable oils; unsalted salad dressings, regular salad dressings limited to 1 Tbs; light, sour and heavy cream Regular salad dressings containing bacon fat, bacon bits, and salt pork; snack dips made with instant soup mixes or processed cheese; salted nuts  Fruits Most fresh, frozen and canned fruits Fruits processed with salt or sodium-containing ingredient  (some dried fruits are processed with sodium sulfites        Vegetables Fresh, frozen vegetables and low- sodium canned vegetables Regular canned vegetables, sauerkraut, pickled vegetables, and others prepared in brine; frozen vegetables in sauces; vegetables seasoned with ham, bacon or salt pork  Condiments, Sauces, Miscellaneous  Salt substitute with physician's approval; pepper, herbs, spices; vinegar, lemon or lime juice; hot pepper sauce; garlic powder, onion powder, low sodium soy sauce (1 Tbs.); low sodium condiments (ketchup, chili sauce, mustard) in limited amounts (1 tsp.) fresh ground horseradish; unsalted tortilla chips, pretzels, potato chips, popcorn, salsa (1/4 cup) Any seasoning made with salt including garlic salt, celery salt, onion salt, and seasoned salt; sea salt, rock salt, kosher salt; meat tenderizers; monosodium glutamate; mustard, regular soy sauce, barbecue, sauce, chili sauce, teriyaki sauce, steak sauce, Worcestershire sauce, and most flavored vinegars; canned gravy and mixes; regular condiments; salted snack foods, olives, picles, relish, horseradish sauce, catsup   Food preparation: Try these seasonings Meats:    Pork Sage, onion Serve with applesauce  Chicken Poultry seasoning, thyme, parsley Serve with cranberry sauce  Lamb Curry powder, rosemary, garlic, thyme Serve with mint sauce or jelly  Veal Marjoram, basil Serve with current jelly, cranberry sauce  Beef Pepper, bay leaf Serve with dry mustard, unsalted chive butter  Fish [place] leaf, dill Serve with unsalted lemon butter, unsalted parsley butter  Vegetables:    Asparagus Lemon juice   Broccoli Lemon juice   Carrots Mustard dressing parsley, mint, nutmeg, glazed with unsalted butter and sugar   Green beans Marjoram, lemon juice, nutmeg,dill seed   Tomatoes Basil, marjoram, onion   Spice /blend for [employer]" 4 tsp ground thyme 1 tsp ground sage 3 tsp ground rosemary 4 tsp ground marjoram   Test your  knowledge A product that says "Salt Free" may still contain sodium. True or False Garlic Powder and Hot  Pepper Sauce an be used as alternative seasonings.True or False Processed foods have more sodium than fresh foods.  True or False Canned Vegetables have less sodium than froze True or False   WAYS TO DECREASE YOUR SODIUM INTAKE Avoid the use of added salt in cooking and at the table.  Table salt (and other prepared seasonings which contain salt) is probably one of the greatest sources of sodium in the diet.  Unsalted foods can gain flavor from the sweet, sour, and butter taste sensations of herbs and spices.  Instead of using salt for seasoning, try the following seasonings with the foods listed.  Remember: how you use them to enhance natural food flavors is limited only by your creativity... Allspice-Meat, fish, eggs, fruit, peas, red and yellow vegetables Almond Extract-Fruit baked goods Anise Seed-Sweet breads, fruit, carrots, beets, cottage cheese, cookies (tastes like licorice) Basil-Meat, fish, eggs, vegetables, rice, vegetables salads, soups, sauces Bay Leaf-Meat, fish, stews, poultry Burnet-Salad, vegetables (cucumber-like flavor) Caraway Seed-Bread, cookies, cottage cheese, meat, vegetables, cheese, rice Cardamon-Baked goods, fruit, soups Celery Powder or seed-Salads, salad dressings, sauces, meatloaf, soup, bread.Do not use  celery salt Chervil-Meats, salads, fish, eggs, vegetables, cottage cheese (parsley-like flavor) Chili Power-Meatloaf, chicken cheese, corn, eggplant, egg dishes Chives-Salads cottage cheese, egg dishes, soups, vegetables, sauces Cilantro-Salsa, casseroles Cinnamon-Baked goods, fruit, pork, lamb, chicken, carrots Cloves-Fruit, baked goods, fish, pot roast, green beans, beets, carrots Coriander-Pastry, cookies, meat, salads, cheese (lemon-orange flavor) Cumin-Meatloaf, fish,cheese, eggs, cabbage,fruit pie (caraway flavor) [employer], fruit, eggs, fish,  poultry, cottage cheese, vegetables Dill Seed-Meat, cottage cheese, poultry, vegetables, fish, salads, bread Fennel Seed-Bread, cookies, apples, pork, eggs, fish, beets, cabbage, cheese, Licorice-like flavor Garlic-(buds or powder) Salads, meat, poultry, fish, bread, butter, vegetables, potatoes.Do not  use garlic salt Ginger-Fruit, vegetables, baked goods, meat, fish, poultry Horseradish Root-Meet, vegetables, butter Lemon Juice or Extract-Vegetables, fruit, tea, baked goods, fish salads Mace-Baked goods fruit, vegetables, fish, poultry (taste like nutmeg) Maple Extract-Syrups Marjoram-Meat, chicken, fish, vegetables, breads, green salads (taste like Sage) Mint-Tea, lamb, sherbet, vegetables, desserts, carrots, cabbage Mustard, Dry or Seed-Cheese, eggs, meats, vegetables, poultry Nutmeg-Baked goods, fruit, chicken, eggs, vegetables, desserts Onion Powder-Meat, fish, poultry, vegetables, cheese, eggs, bread, rice salads (Do not use   Onion salt) Orange Extract-Desserts, baked goods Oregano-Pasta, eggs, cheese, onions, pork, lamb, fish, chicken, vegetables, green salads Paprika-Meat, fish, poultry, eggs, cheese, vegetables Parsley Flakes-Butter, vegetables, meat fish, poultry, eggs, bread, salads (certain forms may   Contain sodium Pepper-Meat fish, poultry, vegetables, eggs Peppermint Extract-Desserts, baked goods Poppy Seed-Eggs, bread, cheese, fruit dressings, baked goods, noodles, vegetables, cottage  [employer], poultry, meat, fish, cauliflower, turnips,eggs bread Saffron-Rice, bread, veal, chicken, fish, eggs Sage-Meat, fish, poultry, onions, eggplant, tomateos, pork, stews Savory-Eggs, salads, poultry, meat, rice, vegetables, soups, pork Tarragon-Meat, poultry, fish, eggs, butter, vegetables (licorice-like flavor)  Thyme-Meat, poultry, fish, eggs, vegetables, (clover-like flavor), sauces, soups Tumeric-Salads, butter, eggs, fish, rice,  vegetables (saffron-like flavor) Vanilla Extract-Baked goods, candy Vinegar-Salads, vegetables, meat marinades Walnut Extract-baked goods, candy   2. Choose your Foods Wisely   The following is a list of foods to avoid which are high in sodium:  Meats-Avoid all smoked, canned, salt cured, dried and kosher meat and fish as well as Anchovies   [employer] meats:Bologna, Liverwurst, Pastrami Canned meat or fish  Marinated herring Caviar    Pepperoni Corned Beef   Pizza Dried chipped beef  Salami Frozen breaded fish or meat Salt pork Frankfurters or hot dogs  Sardines Gefilte fish  Sausage Ham (boiled ham, Proscuitto Smoked butt    spiced ham)   Spam      TV Dinners Vegetables Canned vegetables (Regular) Relish Canned mushrooms  Sauerkraut Olives    Tomato juice Pickles  Bakery and Dessert Products Canned puddings  Cream pies Cheesecake   Decorated cakes Cookies  Beverages/Juices Tomato juice, regular  Gatorade   V-8 vegetable juice, regular  Breads and Cereals Biscuit mixes   Salted potato chips, corn chips, pretzels Bread stuffing mixes  Salted crackers and rolls Pancake and waffle mixes Self-rising flour  Seasonings Accent    Meat sauces Barbecue sauce  Meat tenderizer Catsup    Monosodium glutamate (MSG) Celery salt   Onion salt Chili sauce   Prepared mustard Garlic salt   Salt, seasoned salt, sea salt Gravy mixes   Soy sauce Horseradish   Steak sauce Ketchup   Tartar sauce Lite salt    Teriyaki sauce Marinade mixes   Worcestershire sauce  Others Baking powder   Cocoa and cocoa mixes Baking soda   Commercial casserole mixes Candy-caramels, chocolate  Dehydrated soups    Bars, fudge,nougats  Instant rice and pasta mixes Canned broth or soup  Maraschino cherries Cheese, aged and processed cheese and cheese spreads  Learning Assessment Quiz  Indicated T (for True) or F (for False) for each of the following statements:  _____ Fresh fruits and  vegetables and unprocessed grains are generally low in sodium _____ Water may contain a considerable amount of sodium, depending on the source _____ You can always tell if a food is high in sodium by tasting it _____ Certain laxatives my be high in sodium and should be avoided unless prescribed   by a physician or pharmacist _____ Salt substitutes may be used freely by anyone on a sodium restricted diet _____ Sodium is present in table salt, food additives and as a natural component of   most foods _____ Table salt is approximately 90% sodium _____ Limiting sodium intake may help prevent excess fluid accumulation in the body _____ On a sodium-restricted diet, seasonings such as bouillon soy sauce, and    cooking wine should be used in place of table salt _____ On an ingredient list, a product which lists monosodium glutamate as the first   ingredient is an appropriate food to include on a low sodium diet  Circle the best answer(s) to the following statements (Hint: there may be more than one correct answer)  11. On a low-sodium diet, some acceptable snack items are:    A. Olives  F. Bean dip   K. Grapefruit juice    B. Salted Pretzels G. Commercial Popcorn   L. Canned peaches    C. Carrot Sticks  H. Bouillon   M. Unsalted nuts   D. [place] fries  I. Peanut butter crackers N. Salami   E. Sweet pickles J. Tomato Juice   O. Pizza  12.  Seasonings that may be used freely on a reduced - sodium diet include   A. Lemon wedges F.Monosodium glutamate K. Celery seed    B.Soysauce   G. Pepper   L. Mustard powder   C. Sea salt  H. Cooking wine  M. Onion flakes   D. Vinegar  E. Prepared horseradish N. Salsa   E. Sage   J. Worcestershire sauce  O. [address]   Elson Clan, PA-C  03/15/2021 8:56 AM    [facility] [address] [place], [place], [place]  [address] Phone: [phone];  Fax: [phone]

## 2021-03-14 NOTE — Telephone Encounter (Signed)
Prescription refill request for Eliquis received. Indication: Afib  Last office visit: 02/02/20([place])  Scr:0.95 (03/05/20) Age: 80 Weight: 81.6kg   Pt has scheduled appt for labs and appt with Lenze on 03/15/21. Appropriate dose and refill sent to requested pharmacy.

## 2021-03-15 ENCOUNTER — Other Ambulatory Visit: Payer: Self-pay

## 2021-03-15 ENCOUNTER — Other Ambulatory Visit: Payer: Self-pay | Admitting: *Deleted

## 2021-03-15 ENCOUNTER — Encounter: Payer: Self-pay | Admitting: Physician Assistant

## 2021-03-15 ENCOUNTER — Ambulatory Visit (INDEPENDENT_AMBULATORY_CARE_PROVIDER_SITE_OTHER): Payer: Medicare Other | Admitting: Physician Assistant

## 2021-03-15 ENCOUNTER — Other Ambulatory Visit: Payer: Medicare Other | Admitting: *Deleted

## 2021-03-15 VITALS — BP 130/80 | HR 100 | Ht 71.0 in | Wt 186.8 lb

## 2021-03-15 DIAGNOSIS — E7849 Other hyperlipidemia: Secondary | ICD-10-CM | POA: Diagnosis not present

## 2021-03-15 DIAGNOSIS — I4821 Permanent atrial fibrillation: Secondary | ICD-10-CM

## 2021-03-15 DIAGNOSIS — I872 Venous insufficiency (chronic) (peripheral): Secondary | ICD-10-CM | POA: Diagnosis not present

## 2021-03-15 DIAGNOSIS — Z5181 Encounter for therapeutic drug level monitoring: Secondary | ICD-10-CM | POA: Diagnosis not present

## 2021-03-15 DIAGNOSIS — I10 Essential (primary) hypertension: Secondary | ICD-10-CM

## 2021-03-15 DIAGNOSIS — I4891 Unspecified atrial fibrillation: Secondary | ICD-10-CM

## 2021-03-15 DIAGNOSIS — I4819 Other persistent atrial fibrillation: Secondary | ICD-10-CM

## 2021-03-15 LAB — CBC
Hematocrit: 41.9 % (ref 37.5–51.0)
Hemoglobin: 13.7 g/dL (ref 13.0–17.7)
MCH: 27.7 pg (ref 26.6–33.0)
MCHC: 32.7 g/dL (ref 31.5–35.7)
MCV: 85 fL (ref 79–97)
Platelets: 247 10*3/uL (ref 150–450)
RBC: 4.95 x10E6/uL (ref 4.14–5.80)
RDW: 14 % (ref 11.6–15.4)
WBC: 6 10*3/uL (ref 3.4–10.8)

## 2021-03-15 LAB — BASIC METABOLIC PANEL
BUN/Creatinine Ratio: 28 — ABNORMAL HIGH (ref 10–24)
BUN: 30 mg/dL — ABNORMAL HIGH (ref 8–27)
CO2: 21 mmol/L (ref 20–29)
Calcium: 9 mg/dL (ref 8.6–10.2)
Chloride: 103 mmol/L (ref 96–106)
Creatinine, Ser: 1.07 mg/dL (ref 0.76–1.27)
Glucose: 91 mg/dL (ref 70–99)
Potassium: 4.4 mmol/L (ref 3.5–5.2)
Sodium: 136 mmol/L (ref 134–144)
eGFR: 70 mL/min/{1.73_m2} (ref >59)

## 2021-03-15 LAB — TSH: TSH: 3.65 u[IU]/mL (ref 0.450–4.500)

## 2021-03-15 LAB — HEPATIC FUNCTION PANEL
ALT: 13 IU/L (ref 0–44)
AST: 23 IU/L (ref 0–40)
Albumin: 4 g/dL (ref 3.7–4.7)
Alkaline Phosphatase: 110 IU/L (ref 44–121)
Bilirubin Total: 0.3 mg/dL (ref 0.0–1.2)
Bilirubin, Direct: 0.11 mg/dL (ref 0.00–0.40)
Total Protein: 7.4 g/dL (ref 6.0–8.5)

## 2021-03-15 LAB — LIPID PANEL
Chol/HDL Ratio: 2.7 ratio (ref 0.0–5.0)
Cholesterol, Total: 158 mg/dL (ref 100–199)
HDL: 58 mg/dL (ref >39)
LDL Chol Calc (NIH): 89 mg/dL (ref 0–99)
Triglycerides: 55 mg/dL (ref 0–149)
VLDL Cholesterol Cal: 11 mg/dL (ref 5–40)

## 2021-03-15 MED ORDER — APIXABAN 5 MG PO TABS: ORAL_TABLET | ORAL | 5 refills | Status: DC

## 2021-03-15 NOTE — Telephone Encounter (Signed)
Eliquis 5mg  refill request received. Patient is 80 years old, weight-84.7kg, Crea-1.07 on 03/15/2021, Diagnosis-Afib, and last seen by [place], [place] on today, 03/15/2021. Dose is appropriate based on dosing criteria. Will send in refill to requested pharmacy.

## 2021-03-15 NOTE — Patient Instructions (Signed)
Medication Instructions:  Your physician recommends that you continue on your current medications as directed. Please refer to the Current Medication list given to you today.  *If you need a refill on your cardiac medications before your next appointment, please call your pharmacy*   Lab Work: TODAY: CBC, CMET, TSH If you have labs (blood work) drawn today and your tests are completely normal, you will receive your results only by: MyChart Message (if you have MyChart) OR A paper copy in the mail If you have any lab test that is abnormal or we need to change your treatment, we will call you to review the results.   Testing/Procedures: NONE   Follow-Up: At [facility], you and your health needs are our priority.  As part of our continuing mission to provide you with exceptional heart care, we have created designated Provider Care Teams.  These Care Teams include your primary Cardiologist (physician) and Advanced Practice Providers (APPs -  Physician Assistants and Nurse Practitioners) who all work together to provide you with the care you need, when you need it.  We recommend signing up for the patient portal called "MyChart".  Sign up information is provided on this After Visit Summary.  MyChart is used to connect with patients for Virtual Visits (Telemedicine).  Patients are able to view lab/test results, encounter notes, upcoming appointments, etc.  Non-urgent messages can be sent to your provider as well.   To learn more about what you can do with MyChart, go to [email or web address].    Your next appointment:   6 month(s)  The format for your next appointment:   In Person  Provider:   Tonny Bollman, MD {   You have been [facility] to [facility]. THEIR OFFICE WILL CALL AND SCHEDULE AN APPOINTMENT.  OTHER Two Gram Sodium Diet 2000 mg  What is Sodium? Sodium is a mineral found naturally in many foods. The most significant source of sodium in the diet is table salt,  which is about 40% sodium.  Processed, convenience, and preserved foods also contain a large amount of sodium.  The body needs only 500 mg of sodium daily to function,  A normal diet provides more than enough sodium even if you do not use salt.  Why Limit Sodium? A build up of sodium in the body can cause thirst, increased blood pressure, shortness of breath, and water retention.  Decreasing sodium in the diet can reduce edema and risk of heart attack or stroke associated with high blood pressure.  Keep in mind that there are many other factors involved in these health problems.  Heredity, obesity, lack of exercise, cigarette smoking, stress and what you eat all play a role.  General Guidelines: Do not add salt at the table or in cooking.  One teaspoon of salt contains over 2 grams of sodium. Read food labels Avoid processed and convenience foods Ask your dietitian before eating any foods not dicussed in the menu planning guidelines Consult your physician if you wish to use a salt substitute or a sodium containing medication such as antacids.  Limit milk and milk products to 16 oz (2 cups) per day.  Shopping Hints: READ LABELS!! "Dietetic" does not necessarily mean low sodium. Salt and other sodium ingredients are often added to foods during processing.    Menu Planning Guidelines Food Group Choose More Often Avoid  Beverages (see also the milk group All fruit juices, low-sodium, salt-free vegetables juices, low-sodium carbonated beverages Regular vegetable or tomato juices, commercially  softened water used for drinking or cooking  Breads and Cereals Enriched white, wheat, rye and pumpernickel bread, hard rolls and dinner rolls; muffins, cornbread and waffles; most dry cereals, cooked cereal without added salt; unsalted crackers and breadsticks; low sodium or homemade bread crumbs Bread, rolls and crackers with salted tops; quick breads; instant hot cereals; pancakes; commercial bread stuffing;  self-rising flower and biscuit mixes; regular bread crumbs or cracker crumbs  Desserts and Sweets Desserts and sweets mad with mild should be within allowance Instant pudding mixes and cake mixes  Fats Butter or margarine; vegetable oils; unsalted salad dressings, regular salad dressings limited to 1 Tbs; light, sour and heavy cream Regular salad dressings containing bacon fat, bacon bits, and salt pork; snack dips made with instant soup mixes or processed cheese; salted nuts  Fruits Most fresh, frozen and canned fruits Fruits processed with salt or sodium-containing ingredient (some dried fruits are processed with sodium sulfites        Vegetables Fresh, frozen vegetables and low- sodium canned vegetables Regular canned vegetables, sauerkraut, pickled vegetables, and others prepared in brine; frozen vegetables in sauces; vegetables seasoned with ham, bacon or salt pork  Condiments, Sauces, Miscellaneous  Salt substitute with physician's approval; pepper, herbs, spices; vinegar, lemon or lime juice; hot pepper sauce; garlic powder, onion powder, low sodium soy sauce (1 Tbs.); low sodium condiments (ketchup, chili sauce, mustard) in limited amounts (1 tsp.) fresh ground horseradish; unsalted tortilla chips, pretzels, potato chips, popcorn, salsa (1/4 cup) Any seasoning made with salt including garlic salt, celery salt, onion salt, and seasoned salt; sea salt, rock salt, kosher salt; meat tenderizers; monosodium glutamate; mustard, regular soy sauce, barbecue, sauce, chili sauce, teriyaki sauce, steak sauce, Worcestershire sauce, and most flavored vinegars; canned gravy and mixes; regular condiments; salted snack foods, olives, picles, relish, horseradish sauce, catsup   Food preparation: Try these seasonings Meats:    Pork Sage, onion Serve with applesauce  Chicken Poultry seasoning, thyme, parsley Serve with cranberry sauce  Lamb Curry powder, rosemary, garlic, thyme Serve with mint sauce or jelly   Veal Marjoram, basil Serve with current jelly, cranberry sauce  Beef Pepper, bay leaf Serve with dry mustard, unsalted chive butter  Fish Bay leaf, dill Serve with unsalted lemon butter, unsalted parsley butter  Vegetables:    Asparagus Lemon juice   Broccoli Lemon juice   Carrots Mustard dressing parsley, mint, nutmeg, glazed with unsalted butter and sugar   Green beans Marjoram, lemon juice, nutmeg,dill seed   Tomatoes Basil, marjoram, onion   Spice /blend for [employer]" 4 tsp ground thyme 1 tsp ground sage 3 tsp ground rosemary 4 tsp ground marjoram   Test your knowledge A product that says "Salt Free" may still contain sodium. True or False Garlic Powder and Hot Pepper Sauce an be used as alternative seasonings.True or False Processed foods have more sodium than fresh foods.  True or False Canned Vegetables have less sodium than froze True or False   WAYS TO DECREASE YOUR SODIUM INTAKE Avoid the use of added salt in cooking and at the table.  Table salt (and other prepared seasonings which contain salt) is probably one of the greatest sources of sodium in the diet.  Unsalted foods can gain flavor from the sweet, sour, and butter taste sensations of herbs and spices.  Instead of using salt for seasoning, try the following seasonings with the foods listed.  Remember: how you use them to enhance natural food flavors is limited only by your  creativity... Allspice-Meat, fish, eggs, fruit, peas, red and yellow vegetables Almond Extract-Fruit baked goods Anise Seed-Sweet breads, fruit, carrots, beets, cottage cheese, cookies (tastes like licorice) Basil-Meat, fish, eggs, vegetables, rice, vegetables salads, soups, sauces Bay Leaf-Meat, fish, stews, poultry Burnet-Salad, vegetables (cucumber-like flavor) Caraway Seed-Bread, cookies, cottage cheese, meat, vegetables, cheese, rice Cardamon-Baked goods, fruit, soups Celery Powder or seed-Salads, salad dressings, sauces, meatloaf, soup,  bread.Do not use  celery salt Chervil-Meats, salads, fish, eggs, vegetables, cottage cheese (parsley-like flavor) Chili Power-Meatloaf, chicken cheese, corn, eggplant, egg dishes Chives-Salads cottage cheese, egg dishes, soups, vegetables, sauces Cilantro-Salsa, casseroles Cinnamon-Baked goods, fruit, pork, lamb, chicken, carrots Cloves-Fruit, baked goods, fish, pot roast, green beans, beets, carrots Coriander-Pastry, cookies, meat, salads, cheese (lemon-orange flavor) Cumin-Meatloaf, fish,cheese, eggs, cabbage,fruit pie (caraway flavor) [employer], fruit, eggs, fish, poultry, cottage cheese, vegetables Dill Seed-Meat, cottage cheese, poultry, vegetables, fish, salads, bread Fennel Seed-Bread, cookies, apples, pork, eggs, fish, beets, cabbage, cheese, Licorice-like flavor Garlic-(buds or powder) Salads, meat, poultry, fish, bread, butter, vegetables, potatoes.Do not  use garlic salt Ginger-Fruit, vegetables, baked goods, meat, fish, poultry Horseradish Root-Meet, vegetables, butter Lemon Juice or Extract-Vegetables, fruit, tea, baked goods, fish salads Mace-Baked goods fruit, vegetables, fish, poultry (taste like nutmeg) Maple Extract-Syrups Marjoram-Meat, chicken, fish, vegetables, breads, green salads (taste like Sage) Mint-Tea, lamb, sherbet, vegetables, desserts, carrots, cabbage Mustard, Dry or Seed-Cheese, eggs, meats, vegetables, poultry Nutmeg-Baked goods, fruit, chicken, eggs, vegetables, desserts Onion Powder-Meat, fish, poultry, vegetables, cheese, eggs, bread, rice salads (Do not use   Onion salt) Orange Extract-Desserts, baked goods Oregano-Pasta, eggs, cheese, onions, pork, lamb, fish, chicken, vegetables, green salads Paprika-Meat, fish, poultry, eggs, cheese, vegetables Parsley Flakes-Butter, vegetables, meat fish, poultry, eggs, bread, salads (certain forms may   Contain sodium Pepper-Meat fish, poultry, vegetables, eggs Peppermint Extract-Desserts, baked  goods Poppy Seed-Eggs, bread, cheese, fruit dressings, baked goods, noodles, vegetables, cottage  [employer], poultry, meat, fish, cauliflower, turnips,eggs bread Saffron-Rice, bread, veal, chicken, fish, eggs Sage-Meat, fish, poultry, onions, eggplant, tomateos, pork, stews Savory-Eggs, salads, poultry, meat, rice, vegetables, soups, pork Tarragon-Meat, poultry, fish, eggs, butter, vegetables (licorice-like flavor)  Thyme-Meat, poultry, fish, eggs, vegetables, (clover-like flavor), sauces, soups Tumeric-Salads, butter, eggs, fish, rice, vegetables (saffron-like flavor) Vanilla Extract-Baked goods, candy Vinegar-Salads, vegetables, meat marinades Walnut Extract-baked goods, candy   2. Choose your Foods Wisely   The following is a list of foods to avoid which are high in sodium:  Meats-Avoid all smoked, canned, salt cured, dried and kosher meat and fish as well as Anchovies   [employer] meats:Bologna, Liverwurst, Pastrami Canned meat or fish  Marinated herring Caviar    Pepperoni Corned Beef   Pizza Dried chipped beef  Salami Frozen breaded fish or meat Salt pork Frankfurters or hot dogs  Sardines Gefilte fish   Sausage Ham (boiled ham, Proscuitto Smoked butt    spiced ham)   Spam      TV Dinners Vegetables Canned vegetables (Regular) Relish Canned mushrooms  Sauerkraut Olives    Tomato juice Pickles  Bakery and Dessert Products Canned puddings  Cream pies Cheesecake   Decorated cakes Cookies  Beverages/Juices Tomato juice, regular  Gatorade   V-8 vegetable juice, regular  Breads and Cereals Biscuit mixes   Salted potato chips, corn chips, pretzels Bread stuffing mixes  Salted crackers and rolls Pancake and waffle mixes Self-rising flour  Seasonings Accent    Meat sauces Barbecue sauce  Meat tenderizer Catsup    Monosodium glutamate (MSG) Celery salt   Onion salt Chili sauce  Prepared mustard Garlic  salt   Salt, seasoned salt, sea salt Gravy mixes   Soy sauce Horseradish   Steak sauce Ketchup   Tartar sauce Lite salt    Teriyaki sauce Marinade mixes   Worcestershire sauce  Others Baking powder   Cocoa and cocoa mixes Baking soda   Commercial casserole mixes Candy-caramels, chocolate  Dehydrated soups    Bars, fudge,nougats  Instant rice and pasta mixes Canned broth or soup  Maraschino cherries Cheese, aged and processed cheese and cheese spreads  Learning Assessment Quiz  Indicated T (for True) or F (for False) for each of the following statements:  _____ Fresh fruits and vegetables and unprocessed grains are generally low in sodium _____ Water may contain a considerable amount of sodium, depending on the source _____ You can always tell if a food is high in sodium by tasting it _____ Certain laxatives my be high in sodium and should be avoided unless prescribed   by a physician or pharmacist _____ Salt substitutes may be used freely by anyone on a sodium restricted diet _____ Sodium is present in table salt, food additives and as a natural component of   most foods _____ Table salt is approximately 90% sodium _____ Limiting sodium intake may help prevent excess fluid accumulation in the body _____ On a sodium-restricted diet, seasonings such as bouillon soy sauce, and    cooking wine should be used in place of table salt _____ On an ingredient list, a product which lists monosodium glutamate as the first   ingredient is an appropriate food to include on a low sodium diet  Circle the best answer(s) to the following statements (Hint: there may be more than one correct answer)  11. On a low-sodium diet, some acceptable snack items are:    A. Olives  F. Bean dip   K. Grapefruit juice    B. Salted Pretzels G. Commercial Popcorn   L. Canned peaches    C. Carrot Sticks  H. Bouillon   M. Unsalted nuts   D. [place] fries  I. Peanut butter crackers N. Salami   E. Sweet pickles J.  Tomato Juice   O. Pizza  12.  Seasonings that may be used freely on a reduced - sodium diet include   A. Lemon wedges F.Monosodium glutamate K. Celery seed    B.Soysauce   G. Pepper   L. Mustard powder   C. Sea salt  H. Cooking wine  M. Onion flakes   D. Vinegar  E. Prepared horseradish N. Salsa   E. Sage   J. Worcestershire sauce  O. Chutney

## 2021-03-16 NOTE — Addendum Note (Signed)
Addended by: Michaelle Copas on: 03/16/2021 03:05 PM   Modules accepted: Orders

## 2021-05-18 ENCOUNTER — Encounter (HOSPITAL_BASED_OUTPATIENT_CLINIC_OR_DEPARTMENT_OTHER): Payer: Medicare Other | Attending: Internal Medicine | Admitting: Internal Medicine

## 2021-06-22 ENCOUNTER — Encounter (HOSPITAL_BASED_OUTPATIENT_CLINIC_OR_DEPARTMENT_OTHER): Payer: Medicare Other | Attending: Physician Assistant | Admitting: General Surgery

## 2021-08-08 ENCOUNTER — Other Ambulatory Visit: Payer: Self-pay

## 2021-08-08 ENCOUNTER — Emergency Department (HOSPITAL_COMMUNITY): Payer: Medicare Other

## 2021-08-08 ENCOUNTER — Encounter (HOSPITAL_COMMUNITY): Payer: Self-pay

## 2021-08-08 ENCOUNTER — Inpatient Hospital Stay (HOSPITAL_COMMUNITY)
Admission: EM | Admit: 2021-08-08 | Discharge: 2021-08-18 | DRG: 871 | Disposition: A | Discharge status: Skilled Nursing Facility | Payer: Medicare Other | Attending: Family Medicine | Admitting: Family Medicine

## 2021-08-08 DIAGNOSIS — R627 Adult failure to thrive: Secondary | ICD-10-CM | POA: Diagnosis present

## 2021-08-08 DIAGNOSIS — F4321 Adjustment disorder with depressed mood: Secondary | ICD-10-CM

## 2021-08-08 DIAGNOSIS — Z609 Problem related to social environment, unspecified: Secondary | ICD-10-CM | POA: Diagnosis present

## 2021-08-08 DIAGNOSIS — T148XXA Other injury of unspecified body region, initial encounter: Secondary | ICD-10-CM

## 2021-08-08 DIAGNOSIS — Z20822 Contact with and (suspected) exposure to covid-19: Secondary | ICD-10-CM | POA: Diagnosis present

## 2021-08-08 DIAGNOSIS — N179 Acute kidney failure, unspecified: Secondary | ICD-10-CM | POA: Diagnosis not present

## 2021-08-08 DIAGNOSIS — I10 Essential (primary) hypertension: Secondary | ICD-10-CM | POA: Diagnosis present

## 2021-08-08 DIAGNOSIS — I83028 Varicose veins of left lower extremity with ulcer other part of lower leg: Secondary | ICD-10-CM | POA: Diagnosis present

## 2021-08-08 DIAGNOSIS — Z7901 Long term (current) use of anticoagulants: Secondary | ICD-10-CM

## 2021-08-08 DIAGNOSIS — I872 Venous insufficiency (chronic) (peripheral): Secondary | ICD-10-CM

## 2021-08-08 DIAGNOSIS — L97819 Non-pressure chronic ulcer of other part of right lower leg with unspecified severity: Secondary | ICD-10-CM | POA: Diagnosis present

## 2021-08-08 DIAGNOSIS — L97829 Non-pressure chronic ulcer of other part of left lower leg with unspecified severity: Secondary | ICD-10-CM | POA: Diagnosis present

## 2021-08-08 DIAGNOSIS — Z8249 Family history of ischemic heart disease and other diseases of the circulatory system: Secondary | ICD-10-CM

## 2021-08-08 DIAGNOSIS — Z79899 Other long term (current) drug therapy: Secondary | ICD-10-CM

## 2021-08-08 DIAGNOSIS — A419 Sepsis, unspecified organism: Secondary | ICD-10-CM | POA: Diagnosis not present

## 2021-08-08 DIAGNOSIS — E440 Moderate protein-calorie malnutrition: Secondary | ICD-10-CM | POA: Insufficient documentation

## 2021-08-08 DIAGNOSIS — E875 Hyperkalemia: Secondary | ICD-10-CM | POA: Diagnosis not present

## 2021-08-08 DIAGNOSIS — I2510 Atherosclerotic heart disease of native coronary artery without angina pectoris: Secondary | ICD-10-CM | POA: Diagnosis present

## 2021-08-08 DIAGNOSIS — E785 Hyperlipidemia, unspecified: Secondary | ICD-10-CM | POA: Diagnosis present

## 2021-08-08 DIAGNOSIS — E861 Hypovolemia: Secondary | ICD-10-CM | POA: Diagnosis present

## 2021-08-08 DIAGNOSIS — Z87891 Personal history of nicotine dependence: Secondary | ICD-10-CM

## 2021-08-08 DIAGNOSIS — E871 Hypo-osmolality and hyponatremia: Secondary | ICD-10-CM | POA: Diagnosis not present

## 2021-08-08 DIAGNOSIS — L0390 Cellulitis, unspecified: Secondary | ICD-10-CM

## 2021-08-08 DIAGNOSIS — E860 Dehydration: Secondary | ICD-10-CM | POA: Diagnosis present

## 2021-08-08 DIAGNOSIS — M869 Osteomyelitis, unspecified: Secondary | ICD-10-CM | POA: Diagnosis present

## 2021-08-08 DIAGNOSIS — I4819 Other persistent atrial fibrillation: Secondary | ICD-10-CM | POA: Diagnosis present

## 2021-08-08 DIAGNOSIS — G9341 Metabolic encephalopathy: Secondary | ICD-10-CM | POA: Diagnosis present

## 2021-08-08 DIAGNOSIS — E872 Acidosis, unspecified: Secondary | ICD-10-CM | POA: Diagnosis present

## 2021-08-08 DIAGNOSIS — Z809 Family history of malignant neoplasm, unspecified: Secondary | ICD-10-CM

## 2021-08-08 DIAGNOSIS — Z955 Presence of coronary angioplasty implant and graft: Secondary | ICD-10-CM

## 2021-08-08 DIAGNOSIS — N19 Unspecified kidney failure: Secondary | ICD-10-CM

## 2021-08-08 DIAGNOSIS — I83018 Varicose veins of right lower extremity with ulcer other part of lower leg: Secondary | ICD-10-CM | POA: Diagnosis present

## 2021-08-08 DIAGNOSIS — D696 Thrombocytopenia, unspecified: Secondary | ICD-10-CM | POA: Diagnosis present

## 2021-08-08 DIAGNOSIS — I878 Other specified disorders of veins: Secondary | ICD-10-CM | POA: Diagnosis present

## 2021-08-08 LAB — CBC WITH DIFFERENTIAL/PLATELET
Abs Immature Granulocytes: 0.07 10*3/uL (ref 0.00–0.07)
Basophils Absolute: 0 10*3/uL (ref 0.0–0.1)
Basophils Relative: 0 %
Eosinophils Absolute: 0 10*3/uL (ref 0.0–0.5)
Eosinophils Relative: 0 %
HCT: 43.3 % (ref 39.0–52.0)
Hemoglobin: 14.5 g/dL (ref 13.0–17.0)
Immature Granulocytes: 1 %
Lymphocytes Relative: 7 %
Lymphs Abs: 0.5 10*3/uL — ABNORMAL LOW (ref 0.7–4.0)
MCH: 28 pg (ref 26.0–34.0)
MCHC: 33.5 g/dL (ref 30.0–36.0)
MCV: 83.8 fL (ref 80.0–100.0)
Monocytes Absolute: 0.6 10*3/uL (ref 0.1–1.0)
Monocytes Relative: 9 %
Neutro Abs: 6.2 10*3/uL (ref 1.7–7.7)
Neutrophils Relative %: 83 %
Platelets: 146 10*3/uL — ABNORMAL LOW (ref 150–400)
RBC: 5.17 MIL/uL (ref 4.22–5.81)
RDW: 15.9 % — ABNORMAL HIGH (ref 11.5–15.5)
WBC: 7.5 10*3/uL (ref 4.0–10.5)
nRBC: 0 % (ref 0.0–0.2)

## 2021-08-08 LAB — COMPREHENSIVE METABOLIC PANEL
ALT: 20 U/L (ref 0–44)
AST: 31 U/L (ref 15–41)
Albumin: 3.2 g/dL — ABNORMAL LOW (ref 3.5–5.0)
Alkaline Phosphatase: 98 U/L (ref 38–126)
Anion gap: 10 (ref 5–15)
BUN: 117 mg/dL — ABNORMAL HIGH (ref 8–23)
CO2: 20 mmol/L — ABNORMAL LOW (ref 22–32)
Calcium: 9.1 mg/dL (ref 8.9–10.3)
Chloride: 96 mmol/L — ABNORMAL LOW (ref 98–111)
Creatinine, Ser: 2.74 mg/dL — ABNORMAL HIGH (ref 0.61–1.24)
GFR, Estimated: 23 mL/min — ABNORMAL LOW (ref >60)
Glucose, Bld: 101 mg/dL — ABNORMAL HIGH (ref 70–99)
Potassium: 6.8 mmol/L (ref 3.5–5.1)
Sodium: 126 mmol/L — ABNORMAL LOW (ref 135–145)
Total Bilirubin: 0.6 mg/dL (ref 0.3–1.2)
Total Protein: 7.7 g/dL (ref 6.5–8.1)

## 2021-08-08 LAB — MAGNESIUM: Magnesium: 3.2 mg/dL — ABNORMAL HIGH (ref 1.7–2.4)

## 2021-08-08 LAB — LACTIC ACID, PLASMA: Lactic Acid, Venous: 1.6 mmol/L (ref 0.5–1.9)

## 2021-08-08 LAB — BRAIN NATRIURETIC PEPTIDE: B Natriuretic Peptide: 39.3 pg/mL (ref 0.0–100.0)

## 2021-08-08 LAB — TROPONIN I (HIGH SENSITIVITY): Troponin I (High Sensitivity): 9 ng/L (ref <18)

## 2021-08-08 MED ORDER — INSULIN ASPART 100 UNIT/ML IV SOLN
5.0000 [IU] | Freq: Once | INTRAVENOUS | Status: AC
  Administered 2021-08-08: 5 [IU] via INTRAVENOUS
  Filled 2021-08-08: qty 0.05

## 2021-08-08 MED ORDER — SODIUM ZIRCONIUM CYCLOSILICATE 10 G PO PACK
10.0000 g | PACK | Freq: Two times a day (BID) | ORAL | Status: DC
  Administered 2021-08-09 (×2): 10 g via ORAL
  Filled 2021-08-08 (×2): qty 1

## 2021-08-08 MED ORDER — PIPERACILLIN-TAZOBACTAM 3.375 G IVPB 30 MIN
3.3750 g | Freq: Once | INTRAVENOUS | Status: DC
Start: 2021-08-08 — End: 2021-08-08
  Filled 2021-08-08: qty 50

## 2021-08-08 MED ORDER — SODIUM CHLORIDE 0.9% FLUSH
3.0000 mL | Freq: Two times a day (BID) | INTRAVENOUS | Status: DC
  Administered 2021-08-09 – 2021-08-18 (×15): 3 mL via INTRAVENOUS

## 2021-08-08 MED ORDER — APIXABAN 2.5 MG PO TABS
2.5000 mg | ORAL_TABLET | Freq: Two times a day (BID) | ORAL | Status: DC
  Administered 2021-08-09 – 2021-08-12 (×8): 2.5 mg via ORAL
  Filled 2021-08-08 (×8): qty 1

## 2021-08-08 MED ORDER — POLYETHYLENE GLYCOL 3350 17 G PO PACK: 17.0000 g | PACK | Freq: Every day | ORAL | Status: DC | PRN

## 2021-08-08 MED ORDER — CALCIUM GLUCONATE-NACL 1-0.675 GM/50ML-% IV SOLN
1.0000 g | Freq: Once | INTRAVENOUS | Status: AC
  Administered 2021-08-08: 1000 mg via INTRAVENOUS
  Filled 2021-08-08: qty 50

## 2021-08-08 MED ORDER — DEXTROSE 50 % IV SOLN
1.0000 | Freq: Once | INTRAVENOUS | Status: AC
  Administered 2021-08-08: 50 mL via INTRAVENOUS
  Filled 2021-08-08: qty 50

## 2021-08-08 MED ORDER — SODIUM CHLORIDE 0.9 % IV BOLUS
1000.0000 mL | Freq: Once | INTRAVENOUS | Status: AC
  Administered 2021-08-08: 1000 mL via INTRAVENOUS

## 2021-08-08 MED ORDER — VANCOMYCIN HCL 1500 MG/300ML IV SOLN
1500.0000 mg | Freq: Once | INTRAVENOUS | Status: AC
  Administered 2021-08-09: 1500 mg via INTRAVENOUS
  Filled 2021-08-08: qty 300

## 2021-08-08 MED ORDER — SODIUM CHLORIDE 0.9 % IV SOLN
2.0000 g | Freq: Once | INTRAVENOUS | Status: AC
  Administered 2021-08-09: 2 g via INTRAVENOUS
  Filled 2021-08-08: qty 12.5

## 2021-08-08 MED ORDER — ALBUTEROL SULFATE (2.5 MG/3ML) 0.083% IN NEBU
5.0000 mg | INHALATION_SOLUTION | Freq: Once | RESPIRATORY_TRACT | Status: AC
  Administered 2021-08-08: 5 mg via RESPIRATORY_TRACT
  Filled 2021-08-08: qty 6

## 2021-08-08 MED ORDER — ACETAMINOPHEN 325 MG PO TABS
650.0000 mg | ORAL_TABLET | Freq: Four times a day (QID) | ORAL | Status: DC | PRN
  Administered 2021-08-09 – 2021-08-12 (×2): 650 mg via ORAL
  Filled 2021-08-08 (×2): qty 2

## 2021-08-08 MED ORDER — ACETAMINOPHEN 650 MG RE SUPP: 650.0000 mg | Freq: Four times a day (QID) | RECTAL | Status: DC | PRN

## 2021-08-08 NOTE — ED Provider Notes (Signed)
?[facility] DEPT ?Provider Note ? ? ?CSN: [id number] ?Arrival date & time: 08/08/21  1940 ? ?  ? ?History ? ?Chief Complaint  ?Patient presents with  ? Failure To Thrive  ? ? ?[name] [name] is a 81 y.o. male.  Presented to the emergency department with concern for failure to thrive.  Patient reports that he has had wounds on his lower legs for a very long time and previously had been seen by a wound care specialist however more recently has not been going to see them.  Additional history was obtained from patient's ex-wife.  She states that patient has been living alone for quite some time and no one has been to check on him recently.  She states that she is concerned for decreased mobility, patient not getting around as much.  She was not aware of the condition of the patient's wounds on the legs.  Had not physically seen patient for quite some time but lately has been talking to patient regularly on the phone.. ? ?Additional history obtained from chart review, patient has history of chronic atrial fibrillation, chronic anticoagulation, chronic stasis dermatitis. ? ?Patient was a Armed forces operational officer, Art gallery manager.  Ex-wife states that he was residing at [facility]. ?HPI ? ?  ? ?Home Medications ?Prior to Admission medications   ?Medication Sig Start Date End Date Taking? Authorizing Provider  ?apixaban (ELIQUIS) 5 MG TABS tablet TAKE 1 TABLET(5 MG) BY MOUTH TWICE DAILY 03/15/21   Sherren Mocha, MD  ?Ascorbic Acid (VITAMIN C PO) Take 1 tablet by mouth daily.    [provider]  ?Cholecalciferol (D3-50) 1.25 MG (50000 UT) capsule Take 50,000 Units by mouth daily.    [provider]  ?Cod Liver Oil OIL Take 1 Dose by mouth daily.    [provider]  ?Coenzyme Q10 (CO Q 10 PO) Take 1 capsule by mouth daily.    [provider]  ?MAGNESIUM PO Take 1 tablet by mouth daily.    [provider]  ?Menaquinone-7 (VITAMIN K2 PO) Take 1 tablet  by mouth daily.    [provider]  ?Probiotic Product (PROBIOTIC ADVANCED PO) Take 1 tablet by mouth daily.     [provider]  ?   ? ?Allergies    ?Patient has no known allergies.   ? ?Review of Systems   ?Review of Systems  ?Constitutional:  Positive for fatigue. Negative for chills and fever.  ?HENT:  Negative for ear pain and sore throat.   ?Eyes:  Negative for pain and visual disturbance.  ?Respiratory:  Negative for cough and shortness of breath.   ?Cardiovascular:  Negative for chest pain and palpitations.  ?Gastrointestinal:  Negative for abdominal pain and vomiting.  ?Genitourinary:  Negative for dysuria and hematuria.  ?Musculoskeletal:  Positive for arthralgias. Negative for back pain.  ?Skin:  Positive for rash and wound. Negative for color change.  ?Neurological:  Negative for seizures and syncope.  ?All other systems reviewed and are negative. ? ?Physical Exam ?Updated Vital Signs ?BP 128/88   Pulse (!) 117   Temp (!) 97.5 ?F (36.4 ?C) (Oral)   Resp 20   Ht 5\' 11"  (1.803 m)   Wt 74.8 kg   SpO2 98%   BMI 23.01 kg/m?  ?Physical Exam ?Vitals and nursing note reviewed.  ?Constitutional:   ?   General: He is not in acute distress. ?   Appearance: He is well-developed.  ?HENT:  ?   Head: Normocephalic and  atraumatic.  ?Eyes:  ?   Conjunctiva/sclera: Conjunctivae normal.  ?Cardiovascular:  ?   Rate and Rhythm: Tachycardia present. Rhythm irregular.  ?   Heart sounds: No murmur heard. ?Pulmonary:  ?   Effort: Pulmonary effort is normal. No respiratory distress.  ?   Breath sounds: Normal breath sounds.  ?Abdominal:  ?   Palpations: Abdomen is soft.  ?   Tenderness: There is no abdominal tenderness.  ?Musculoskeletal:     ?   General: No swelling.  ?   Cervical back: Neck supple.  ?   Comments: Patient is chronic appearing, generalized erythema, edematous lower extremities, foul-smelling, maggots present on feet and legs, both feet are warm and feels well-perfused but difficult  palpating pulses due to the edema  ?Skin: ?   Capillary Refill: Capillary refill takes less than 2 seconds.  ?   Findings: Rash present.  ?   Comments: Wound to bilateral lower legs as above  ?Neurological:  ?   Mental Status: He is alert.  ?   Comments: Alert, mildly slow to speak, answers basic questions appropriately but struggles with complex questioning  ?Psychiatric:     ?   Mood and Affect: Mood normal.  ? ?  ?Media Information ?Document Information ? ?Photos  ?Legs  ?08/08/2021 20:52  ?Attached To:  ?Hospital Encounter on 08/08/21  ? ?Source Information ? ?Lucrezia Starch, MD  [facility]  ? ? ? ?ED Results / Procedures / Treatments   ?Labs ?(all labs ordered are listed, but only abnormal results are displayed) ?Labs Reviewed  ?CBC WITH DIFFERENTIAL/PLATELET - Abnormal; Notable for the following components:  ?    Result Value  ? RDW 15.9 (*)   ? Platelets 146 (*)   ? Lymphs Abs 0.5 (*)   ? All other components within normal limits  ?COMPREHENSIVE METABOLIC PANEL - Abnormal; Notable for the following components:  ? Sodium 126 (*)   ? Potassium 6.8 (*)   ? Chloride 96 (*)   ? CO2 20 (*)   ? Glucose, Bld 101 (*)   ? BUN 117 (*)   ? Creatinine, Ser 2.74 (*)   ? Albumin 3.2 (*)   ? GFR, Estimated 23 (*)   ? All other components within normal limits  ?MAGNESIUM - Abnormal; Notable for the following components:  ? Magnesium 3.2 (*)   ? All other components within normal limits  ?CULTURE, BLOOD (ROUTINE X 2)  ?CULTURE, BLOOD (ROUTINE X 2)  ?RESP PANEL BY RT-PCR (FLU A&B, COVID) [id number]  ?LACTIC ACID, PLASMA  ?BRAIN NATRIURETIC PEPTIDE  ?RENAL FUNCTION PANEL  ?RENAL FUNCTION PANEL  ?RENAL FUNCTION PANEL  ?RENAL FUNCTION PANEL  ?RENAL FUNCTION PANEL  ?CBC  ?SEDIMENTATION RATE  ?C-REACTIVE PROTEIN  ?TROPONIN I (HIGH SENSITIVITY)  ?TROPONIN I (HIGH SENSITIVITY)  ? ? ?EKG ?EKG Interpretation ? ?Date/Time:  Monday August 08 2021 21:03:25 EDT ?Ventricular Rate:  112 ?PR Interval:    ?QRS Duration: 115 ?QT  Interval:  320 ?QTC Calculation: 437 ?R Axis:   89 ?Text Interpretation: Atrial fibrillation Ventricular premature complex Nonspecific intraventricular conduction delay Minimal ST depression, diffuse leads Confirmed by Madalyn Rob [phone]) on 08/08/2021 9:42:23 PM ? ?Radiology ?DG Chest 1 View ? ?Result Date: 08/08/2021 ?CLINICAL DATA:  Atrial fibrillation, shortness of breath EXAM: CHEST  1 VIEW COMPARISON:  Chest radiograph dated March 12, 2005 FINDINGS: The heart size and mediastinal contours are within normal limits. Atherosclerotic calcification of aortic arch. Elevation of the left hemidiaphragm. Lungs otherwise clear  without evidence of focal consolidation or large pleural effusion. Left costophrenic angle is not included. IMPRESSION: 1. Elevation of the left hemidiaphragm. Left costophrenic angle is not included. No focal consolidation or large pleural effusion. 2. Heart is normal in size. Atherosclerotic calcification of aortic arch. Electronically Signed   By: Keane Police D.O.   On: 08/08/2021 22:43  ? ?DG Tibia/Fibula Left ? ?Result Date: 08/08/2021 ?CLINICAL DATA:  Bilateral lower leg wounds. Necrosis and weeping wounds entire both tibia/fibula and feet. EXAM: LEFT TIBIA AND FIBULA - 2 VIEW COMPARISON:  None. FINDINGS: There is no evidence of fracture or other focal bone lesions. No cortical erosion or periosteal reaction. Skin thickening and irregularity consistent with multiple skin consistent with patient's history. IMPRESSION: No acute osseous abnormality. No radiographic evidence of osteomyelitis. Skin irregularity consistent with history of skin wounds. Electronically Signed   By: Keane Police D.O.   On: 08/08/2021 22:46  ? ?DG Tibia/Fibula Right ? ?Result Date: 08/08/2021 ?CLINICAL DATA:  Bilateral lower extremity skin wounds. EXAM: RIGHT TIBIA AND FIBULA - 2 VIEW COMPARISON:  None. FINDINGS: There is no evidence of fracture. No cortical erosion or periosteal reaction to suggest acute  osteomyelitis. Skin irregularity consistent with history of multiple skin wounds. IMPRESSION: 1. No cortical erosion or periosteal reaction to suggest acute osteomyelitis. 2. Skin irregularity consistent with history of skin wounds

## 2021-08-08 NOTE — Progress Notes (Signed)
A consult was received from an ED physician for Vancomycin per pharmacy dosing.  The patient's profile has been reviewed for ht/wt/allergies/indication/available labs.   ?A one time order has been placed for Vancomycin 1500mg  IV.  Further antibiotics/pharmacy consults should be ordered by admitting physician if indicated.       ?                ?Thank you, ? PharmD ?08/08/2021  9:50 PM ? ?

## 2021-08-08 NOTE — ED Triage Notes (Signed)
Pt BIB EMS with c./o Necrotic tissue of BLE from knee to feet. Pt lives alone, wellfare check today revealed pt in poor conditions, not taking his medications.  ? ?EMS vitals:  ?BP 120/70  ? ?

## 2021-08-08 NOTE — H&P (Signed)
?History and Physical  ? ?[name] [name] [id number] DOB: October 24, 1940 DOA: 08/08/2021 ? ?PCP: Forrest Moron, MD  ? ?Patient coming from: Home ? ?Chief Complaint: Leg wound, general decline ? ?HPI: [name] [name] is a 81 y.o. male with medical history significant of atrial fibrillation, CAD, hyperlipidemia, venous stasis, retinopathy presenting with general decline and worsening leg wound. ? ?Some history obtained with assistance of chart review.  Friends were previously here providing history but have gone home.  Patient has known history of chronic lower extremity wounds from venous stasis.  Had previously been seeing wound care clinic but it stopped taking care of himself and had not been going to the clinic.  He has been living alone for some time following a recent divorce.  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  His previous ex-wife has been checking on him by phone but had not seen him in some time.  Patient was checked on for well check today and found to have general decline and significant worsening of his leg wounds. ? ?He denies fevers, chills, chest pain, shortness of breath, abdominal pain, constipation, diarrhea, nausea, vomiting ? ?ED Course: Vital signs in the ED significant for temperature of 97.5, heart rate in the 100 100s 20 with A-fib rhythm, blood pressure in the [id number] to [id number] systolic.  Lab work-up included CMP with sodium 126, chloride 96, potassium 6.8, bicarb 20, BUN 117, creatinine elevated to 2.74 from baseline of 1.  Glucose 101, albumin 3.2.  CBC with platelets 146.  Magnesium elevated at 3.2.  Lactic acid normal.  Troponin normal.  BNP normal.  Retropalatal flu and COVID pending.  Blood cultures pending.  Chest x-ray showing elevated left hemidiaphragm, no consolidation or pleural effusion.  Tib-fib x-ray on the left and right both showed no osseous deformity but did show skin changes.  Foot x-ray on the right showed osseous  erosion at the fifth metatarsal head consistent with osteopenia versus osteomyelitis.  The exact plan reviewed with placed for foot x-ray on the left unclear if this is the true read versus input error. ? ?Patient received vancomycin, dextrose, insulin, calcium gluconate, albuterol, 1 L fluid in the ED.  Order placed for Zosyn but has attempted to change this to cefepime.   ? ?Review of Systems: As per HPI otherwise all other systems reviewed and are negative. ? ?Past Medical History:  ?Diagnosis Date  ? CAD (coronary artery disease)   ? LHC 11/06: pLAD 90, pD1 30, oD2 25 with inf branch 60; septal perf ostial 95; prox MOM 25, RCA 25, oPL2 95, EF 65 >> PCI:  BMS to proximal LAD   ? FH: CAD (coronary artery disease)   ? History of echocardiogram   ? Echo 3/17:  Mod LVH, EF 55-60, no RWMA, mild AI, mild to mod RAE  ? History of tobacco abuse   ? Hyperlipidemia   ? Persistent atrial fibrillation ([place])   ? Xarelto for a/c  ? ? ?Past Surgical History:  ?Procedure Laterality Date  ? CARDIAC CATHETERIZATION    ? CORONARY STENT PLACEMENT    ? stents of the proximal lad with bare metal stent  ? PERCUTANEOUS CORONARY STENT INTERVENTION (PCI-S)    ? ? ?Social History ? reports that he quit smoking about 43 years ago. His smoking use included cigarettes. He has never used smokeless tobacco. He reports that he does not drink alcohol and does not use drugs. ? ?  No Known Allergies ? ?Family History  ?Problem Relation Age of Onset  ? Cancer - Colon Mother   ? Heart attack Father   ? Hypertension Father   ?Reviewed on admission ? ?Prior to Admission medications   ?Medication Sig Start Date End Date Taking? Authorizing Provider  ?apixaban (ELIQUIS) 5 MG TABS tablet TAKE 1 TABLET(5 MG) BY MOUTH TWICE DAILY 03/15/21   Sherren Mocha, MD  ?Ascorbic Acid (VITAMIN C PO) Take 1 tablet by mouth daily.    [provider]  ?Cholecalciferol (D3-50) 1.25 MG (50000 UT) capsule Take 50,000 Units by mouth daily.    [provider]  ?Cod Liver Oil OIL Take 1 Dose by mouth daily.    [provider]  ?Coenzyme Q10 (CO Q 10 PO) Take 1 capsule by mouth daily.    [provider]  ?MAGNESIUM PO Take 1 tablet by mouth daily.    [provider]  ?Menaquinone-7 (VITAMIN K2 PO) Take 1 tablet by mouth daily.    [provider]  ?Probiotic Product (PROBIOTIC ADVANCED PO) Take 1 tablet by mouth daily.     [provider]  ? ? ?Physical Exam: ?Vitals:  ? 08/08/21 2100 08/08/21 2130 08/08/21 2200 08/08/21 2300  ?BP: (!) 120/46 101/70 104/63 128/88  ?Pulse: (!) 106 81 (!) 138 (!) 117  ?Resp: (!) 22 (!) 24 14 20   ?Temp:      ?TempSrc:      ?SpO2: 98% 99% 96% 98%  ?Weight:      ?Height:      ? ? ?Physical Exam ?Constitutional:   ?   General: He is not in acute distress. ?   Appearance: Normal appearance.  ?HENT:  ?   Head: Normocephalic and atraumatic.  ?   Mouth/Throat:  ?   Mouth: Mucous membranes are moist.  ?   Pharynx: Oropharynx is clear.  ?Eyes:  ?   Extraocular Movements: Extraocular movements intact.  ?   Pupils: Pupils are equal, round, and reactive to light.  ?Cardiovascular:  ?   Rate and Rhythm: Normal rate and regular rhythm.  ?   Pulses: Normal pulses.  ?   Heart sounds: Normal heart sounds.  ?Pulmonary:  ?   Effort: Pulmonary effort is normal. No respiratory distress.  ?   Breath sounds: Normal breath sounds.  ?Abdominal:  ?   General: Bowel sounds are normal. There is no distension.  ?   Palpations: Abdomen is soft.  ?   Tenderness: There is no abdominal tenderness.  ?Musculoskeletal:     ?   General: Deformity present.  ?   Right lower leg: Edema present.  ?   Left lower leg: Edema present.  ?   Comments: Significant bilateral lower extremity wounds, history of venous stasis, possible cellulitic superinfection.  ?Skin: ?   General: Skin is warm and dry.  ?Neurological:  ?   General: No focal deficit present.  ?   Mental Status: Mental status is at baseline.  ? ? ?Labs on Admission: I have  personally reviewed following labs and imaging studies ? ?CBC: ?Recent Labs  ?Lab 08/08/21 ?2100  ?WBC 7.5  ?NEUTROABS 6.2  ?HGB 14.5  ?HCT 43.3  ?MCV 83.8  ?PLT 146*  ? ? ?Basic Metabolic Panel: ?Recent Labs  ?Lab 08/08/21 ?2100  ?NA 126*  ?K 6.8*  ?CL 96*  ?CO2 20*  ?GLUCOSE 101*  ?BUN 117*  ?CREATININE 2.74*  ?CALCIUM 9.1  ?MG 3.2*  ? ? ?GFR: ?Estimated Creatinine  Clearance: 22.7 mL/min (A) (by C-G formula based on SCr of 2.74 mg/dL (H)). ? ?Liver Function Tests: ?Recent Labs  ?Lab 08/08/21 ?2100  ?AST 31  ?ALT 20  ?ALKPHOS 98  ?BILITOT 0.6  ?PROT 7.7  ?ALBUMIN 3.2*  ? ? ?Urine analysis: ?No results found for: COLORURINE, APPEARANCEUR, [place], [place], [place], [place], [place], KETONESUR, PROTEINUR, [place], NITRITE, LEUKOCYTESUR ? ?Radiological Exams on Admission: ?DG Chest 1 View ? ?Result Date: 08/08/2021 ?CLINICAL DATA:  Atrial fibrillation, shortness of breath EXAM: CHEST  1 VIEW COMPARISON:  Chest radiograph dated March 12, 2005 FINDINGS: The heart size and mediastinal contours are within normal limits. Atherosclerotic calcification of aortic arch. Elevation of the left hemidiaphragm. Lungs otherwise clear without evidence of focal consolidation or large pleural effusion. Left costophrenic angle is not included. IMPRESSION: 1. Elevation of the left hemidiaphragm. Left costophrenic angle is not included. No focal consolidation or large pleural effusion. 2. Heart is normal in size. Atherosclerotic calcification of aortic arch. Electronically Signed   By: Keane Police D.O.   On: 08/08/2021 22:43  ? ?DG Tibia/Fibula Left ? ?Result Date: 08/08/2021 ?CLINICAL DATA:  Bilateral lower leg wounds. Necrosis and weeping wounds entire both tibia/fibula and feet. EXAM: LEFT TIBIA AND FIBULA - 2 VIEW COMPARISON:  None. FINDINGS: There is no evidence of fracture or other focal bone lesions. No cortical erosion or periosteal reaction. Skin thickening and irregularity consistent with multiple skin consistent with  patient's history. IMPRESSION: No acute osseous abnormality. No radiographic evidence of osteomyelitis. Skin irregularity consistent with history of skin wounds. Electronically Signed   By: Judye Bos.O.

## 2021-08-09 ENCOUNTER — Observation Stay (HOSPITAL_COMMUNITY): Payer: Medicare Other

## 2021-08-09 ENCOUNTER — Inpatient Hospital Stay (HOSPITAL_COMMUNITY): Payer: Medicare Other

## 2021-08-09 DIAGNOSIS — Z7901 Long term (current) use of anticoagulants: Secondary | ICD-10-CM | POA: Diagnosis not present

## 2021-08-09 DIAGNOSIS — F323 Major depressive disorder, single episode, severe with psychotic features: Secondary | ICD-10-CM | POA: Diagnosis not present

## 2021-08-09 DIAGNOSIS — Z79899 Other long term (current) drug therapy: Secondary | ICD-10-CM | POA: Diagnosis not present

## 2021-08-09 DIAGNOSIS — I2510 Atherosclerotic heart disease of native coronary artery without angina pectoris: Secondary | ICD-10-CM | POA: Diagnosis present

## 2021-08-09 DIAGNOSIS — Z87891 Personal history of nicotine dependence: Secondary | ICD-10-CM | POA: Diagnosis not present

## 2021-08-09 DIAGNOSIS — M86271 Subacute osteomyelitis, right ankle and foot: Secondary | ICD-10-CM | POA: Diagnosis not present

## 2021-08-09 DIAGNOSIS — S81809A Unspecified open wound, unspecified lower leg, initial encounter: Secondary | ICD-10-CM

## 2021-08-09 DIAGNOSIS — E871 Hypo-osmolality and hyponatremia: Secondary | ICD-10-CM | POA: Diagnosis present

## 2021-08-09 DIAGNOSIS — L0390 Cellulitis, unspecified: Secondary | ICD-10-CM | POA: Insufficient documentation

## 2021-08-09 DIAGNOSIS — N19 Unspecified kidney failure: Secondary | ICD-10-CM | POA: Diagnosis not present

## 2021-08-09 DIAGNOSIS — M869 Osteomyelitis, unspecified: Secondary | ICD-10-CM | POA: Diagnosis present

## 2021-08-09 DIAGNOSIS — E875 Hyperkalemia: Secondary | ICD-10-CM | POA: Diagnosis present

## 2021-08-09 DIAGNOSIS — F333 Major depressive disorder, recurrent, severe with psychotic symptoms: Secondary | ICD-10-CM | POA: Diagnosis not present

## 2021-08-09 DIAGNOSIS — I351 Nonrheumatic aortic (valve) insufficiency: Secondary | ICD-10-CM | POA: Diagnosis not present

## 2021-08-09 DIAGNOSIS — F03B18 Unspecified dementia, moderate, with other behavioral disturbance: Secondary | ICD-10-CM | POA: Diagnosis not present

## 2021-08-09 DIAGNOSIS — R627 Adult failure to thrive: Secondary | ICD-10-CM | POA: Diagnosis present

## 2021-08-09 DIAGNOSIS — N179 Acute kidney failure, unspecified: Secondary | ICD-10-CM | POA: Diagnosis not present

## 2021-08-09 DIAGNOSIS — I878 Other specified disorders of veins: Secondary | ICD-10-CM | POA: Diagnosis present

## 2021-08-09 DIAGNOSIS — F4321 Adjustment disorder with depressed mood: Secondary | ICD-10-CM | POA: Diagnosis not present

## 2021-08-09 DIAGNOSIS — A419 Sepsis, unspecified organism: Secondary | ICD-10-CM | POA: Diagnosis present

## 2021-08-09 DIAGNOSIS — F03B2 Unspecified dementia, moderate, with psychotic disturbance: Secondary | ICD-10-CM | POA: Diagnosis not present

## 2021-08-09 DIAGNOSIS — T1490XA Injury, unspecified, initial encounter: Secondary | ICD-10-CM | POA: Diagnosis not present

## 2021-08-09 DIAGNOSIS — D696 Thrombocytopenia, unspecified: Secondary | ICD-10-CM | POA: Diagnosis present

## 2021-08-09 DIAGNOSIS — E872 Acidosis, unspecified: Secondary | ICD-10-CM | POA: Diagnosis present

## 2021-08-09 DIAGNOSIS — E785 Hyperlipidemia, unspecified: Secondary | ICD-10-CM | POA: Diagnosis present

## 2021-08-09 DIAGNOSIS — T148XXA Other injury of unspecified body region, initial encounter: Secondary | ICD-10-CM | POA: Diagnosis not present

## 2021-08-09 DIAGNOSIS — R6520 Severe sepsis without septic shock: Secondary | ICD-10-CM | POA: Diagnosis not present

## 2021-08-09 DIAGNOSIS — E860 Dehydration: Secondary | ICD-10-CM | POA: Diagnosis present

## 2021-08-09 DIAGNOSIS — I872 Venous insufficiency (chronic) (peripheral): Secondary | ICD-10-CM | POA: Diagnosis not present

## 2021-08-09 DIAGNOSIS — I10 Essential (primary) hypertension: Secondary | ICD-10-CM | POA: Diagnosis present

## 2021-08-09 DIAGNOSIS — Z20822 Contact with and (suspected) exposure to covid-19: Secondary | ICD-10-CM | POA: Diagnosis present

## 2021-08-09 DIAGNOSIS — I4819 Other persistent atrial fibrillation: Secondary | ICD-10-CM | POA: Diagnosis present

## 2021-08-09 DIAGNOSIS — L97819 Non-pressure chronic ulcer of other part of right lower leg with unspecified severity: Secondary | ICD-10-CM | POA: Diagnosis present

## 2021-08-09 DIAGNOSIS — E440 Moderate protein-calorie malnutrition: Secondary | ICD-10-CM | POA: Diagnosis present

## 2021-08-09 DIAGNOSIS — G9341 Metabolic encephalopathy: Secondary | ICD-10-CM | POA: Diagnosis present

## 2021-08-09 DIAGNOSIS — L97829 Non-pressure chronic ulcer of other part of left lower leg with unspecified severity: Secondary | ICD-10-CM | POA: Diagnosis present

## 2021-08-09 DIAGNOSIS — Z955 Presence of coronary angioplasty implant and graft: Secondary | ICD-10-CM | POA: Diagnosis not present

## 2021-08-09 LAB — RENAL FUNCTION PANEL
Albumin: 2.1 g/dL — ABNORMAL LOW (ref 3.5–5.0)
Albumin: 2.2 g/dL — ABNORMAL LOW (ref 3.5–5.0)
Albumin: 2.3 g/dL — ABNORMAL LOW (ref 3.5–5.0)
Albumin: 2.4 g/dL — ABNORMAL LOW (ref 3.5–5.0)
Albumin: 2.4 g/dL — ABNORMAL LOW (ref 3.5–5.0)
Albumin: 2.5 g/dL — ABNORMAL LOW (ref 3.5–5.0)
Albumin: 2.6 g/dL — ABNORMAL LOW (ref 3.5–5.0)
Albumin: 2.9 g/dL — ABNORMAL LOW (ref 3.5–5.0)
Anion gap: 10 (ref 5–15)
Anion gap: 5 (ref 5–15)
Anion gap: 5 (ref 5–15)
Anion gap: 6 (ref 5–15)
Anion gap: 7 (ref 5–15)
Anion gap: 8 (ref 5–15)
Anion gap: 8 (ref 5–15)
Anion gap: 8 (ref 5–15)
BUN: 106 mg/dL — ABNORMAL HIGH (ref 8–23)
BUN: 108 mg/dL — ABNORMAL HIGH (ref 8–23)
BUN: 117 mg/dL — ABNORMAL HIGH (ref 8–23)
BUN: 67 mg/dL — ABNORMAL HIGH (ref 8–23)
BUN: 68 mg/dL — ABNORMAL HIGH (ref 8–23)
BUN: 69 mg/dL — ABNORMAL HIGH (ref 8–23)
BUN: 74 mg/dL — ABNORMAL HIGH (ref 8–23)
BUN: 94 mg/dL — ABNORMAL HIGH (ref 8–23)
CO2: 15 mmol/L — ABNORMAL LOW (ref 22–32)
CO2: 15 mmol/L — ABNORMAL LOW (ref 22–32)
CO2: 16 mmol/L — ABNORMAL LOW (ref 22–32)
CO2: 16 mmol/L — ABNORMAL LOW (ref 22–32)
CO2: 17 mmol/L — ABNORMAL LOW (ref 22–32)
CO2: 17 mmol/L — ABNORMAL LOW (ref 22–32)
CO2: 18 mmol/L — ABNORMAL LOW (ref 22–32)
CO2: 18 mmol/L — ABNORMAL LOW (ref 22–32)
Calcium: 7.2 mg/dL — ABNORMAL LOW (ref 8.9–10.3)
Calcium: 7.4 mg/dL — ABNORMAL LOW (ref 8.9–10.3)
Calcium: 8 mg/dL — ABNORMAL LOW (ref 8.9–10.3)
Calcium: 8.1 mg/dL — ABNORMAL LOW (ref 8.9–10.3)
Calcium: 8.2 mg/dL — ABNORMAL LOW (ref 8.9–10.3)
Calcium: 8.2 mg/dL — ABNORMAL LOW (ref 8.9–10.3)
Calcium: 8.3 mg/dL — ABNORMAL LOW (ref 8.9–10.3)
Calcium: 8.8 mg/dL — ABNORMAL LOW (ref 8.9–10.3)
Chloride: 100 mmol/L (ref 98–111)
Chloride: 102 mmol/L (ref 98–111)
Chloride: 105 mmol/L (ref 98–111)
Chloride: 107 mmol/L (ref 98–111)
Chloride: 108 mmol/L (ref 98–111)
Chloride: 109 mmol/L (ref 98–111)
Chloride: 98 mmol/L (ref 98–111)
Chloride: 99 mmol/L (ref 98–111)
Creatinine, Ser: 1.41 mg/dL — ABNORMAL HIGH (ref 0.61–1.24)
Creatinine, Ser: 1.49 mg/dL — ABNORMAL HIGH (ref 0.61–1.24)
Creatinine, Ser: 1.49 mg/dL — ABNORMAL HIGH (ref 0.61–1.24)
Creatinine, Ser: 1.59 mg/dL — ABNORMAL HIGH (ref 0.61–1.24)
Creatinine, Ser: 1.88 mg/dL — ABNORMAL HIGH (ref 0.61–1.24)
Creatinine, Ser: 2.02 mg/dL — ABNORMAL HIGH (ref 0.61–1.24)
Creatinine, Ser: 2.16 mg/dL — ABNORMAL HIGH (ref 0.61–1.24)
Creatinine, Ser: 2.5 mg/dL — ABNORMAL HIGH (ref 0.61–1.24)
GFR, Estimated: 25 mL/min — ABNORMAL LOW (ref >60)
GFR, Estimated: 30 mL/min — ABNORMAL LOW (ref >60)
GFR, Estimated: 33 mL/min — ABNORMAL LOW (ref >60)
GFR, Estimated: 36 mL/min — ABNORMAL LOW (ref >60)
GFR, Estimated: 44 mL/min — ABNORMAL LOW (ref >60)
GFR, Estimated: 47 mL/min — ABNORMAL LOW (ref >60)
GFR, Estimated: 47 mL/min — ABNORMAL LOW (ref >60)
GFR, Estimated: 50 mL/min — ABNORMAL LOW (ref >60)
Glucose, Bld: 100 mg/dL — ABNORMAL HIGH (ref 70–99)
Glucose, Bld: 75 mg/dL (ref 70–99)
Glucose, Bld: 82 mg/dL (ref 70–99)
Glucose, Bld: 82 mg/dL (ref 70–99)
Glucose, Bld: 83 mg/dL (ref 70–99)
Glucose, Bld: 84 mg/dL (ref 70–99)
Glucose, Bld: 92 mg/dL (ref 70–99)
Glucose, Bld: 94 mg/dL (ref 70–99)
Phosphorus: 3.3 mg/dL (ref 2.5–4.6)
Phosphorus: 3.5 mg/dL (ref 2.5–4.6)
Phosphorus: 3.6 mg/dL (ref 2.5–4.6)
Phosphorus: 3.6 mg/dL (ref 2.5–4.6)
Phosphorus: 3.6 mg/dL (ref 2.5–4.6)
Phosphorus: 3.6 mg/dL (ref 2.5–4.6)
Phosphorus: 3.7 mg/dL (ref 2.5–4.6)
Phosphorus: 3.9 mg/dL (ref 2.5–4.6)
Potassium: 4 mmol/L (ref 3.5–5.1)
Potassium: 4.1 mmol/L (ref 3.5–5.1)
Potassium: 4.1 mmol/L (ref 3.5–5.1)
Potassium: 4.1 mmol/L (ref 3.5–5.1)
Potassium: 5 mmol/L (ref 3.5–5.1)
Potassium: 5.6 mmol/L — ABNORMAL HIGH (ref 3.5–5.1)
Potassium: 5.7 mmol/L — ABNORMAL HIGH (ref 3.5–5.1)
Potassium: 5.8 mmol/L — ABNORMAL HIGH (ref 3.5–5.1)
Sodium: 123 mmol/L — ABNORMAL LOW (ref 135–145)
Sodium: 123 mmol/L — ABNORMAL LOW (ref 135–145)
Sodium: 125 mmol/L — ABNORMAL LOW (ref 135–145)
Sodium: 126 mmol/L — ABNORMAL LOW (ref 135–145)
Sodium: 129 mmol/L — ABNORMAL LOW (ref 135–145)
Sodium: 130 mmol/L — ABNORMAL LOW (ref 135–145)
Sodium: 130 mmol/L — ABNORMAL LOW (ref 135–145)
Sodium: 131 mmol/L — ABNORMAL LOW (ref 135–145)

## 2021-08-09 LAB — C-REACTIVE PROTEIN: CRP: 1.9 mg/dL — ABNORMAL HIGH (ref <1.0)

## 2021-08-09 LAB — RESP PANEL BY RT-PCR (FLU A&B, COVID) ARPGX2
Influenza A by PCR: NEGATIVE
Influenza B by PCR: NEGATIVE
SARS Coronavirus 2 by RT PCR: NEGATIVE

## 2021-08-09 LAB — RAPID URINE DRUG SCREEN, HOSP PERFORMED
Amphetamines: NOT DETECTED
Barbiturates: NOT DETECTED
Benzodiazepines: NOT DETECTED
Cocaine: NOT DETECTED
Opiates: NOT DETECTED
Tetrahydrocannabinol: NOT DETECTED

## 2021-08-09 LAB — OSMOLALITY, URINE
Osmolality, Ur: 568 mOsm/kg (ref 300–900)
Osmolality, Ur: 572 mOsm/kg (ref 300–900)

## 2021-08-09 LAB — SODIUM, URINE, RANDOM
Sodium, Ur: <10 mmol/L
Sodium, Ur: <10 mmol/L

## 2021-08-09 LAB — CBC
HCT: 33.1 % — ABNORMAL LOW (ref 39.0–52.0)
Hemoglobin: 11.2 g/dL — ABNORMAL LOW (ref 13.0–17.0)
MCH: 28.2 pg (ref 26.0–34.0)
MCHC: 33.8 g/dL (ref 30.0–36.0)
MCV: 83.4 fL (ref 80.0–100.0)
Platelets: 130 10*3/uL — ABNORMAL LOW (ref 150–400)
RBC: 3.97 MIL/uL — ABNORMAL LOW (ref 4.22–5.81)
RDW: 15.9 % — ABNORMAL HIGH (ref 11.5–15.5)
WBC: 7 10*3/uL (ref 4.0–10.5)
nRBC: 0 % (ref 0.0–0.2)

## 2021-08-09 LAB — TROPONIN I (HIGH SENSITIVITY): Troponin I (High Sensitivity): 9 ng/L (ref <18)

## 2021-08-09 LAB — MAGNESIUM: Magnesium: 2.3 mg/dL (ref 1.7–2.4)

## 2021-08-09 LAB — CREATININE, URINE, RANDOM: Creatinine, Urine: 80.29 mg/dL

## 2021-08-09 LAB — SEDIMENTATION RATE
Sed Rate: 12 mm/hr (ref 0–16)
Sed Rate: 13 mm/hr (ref 0–16)

## 2021-08-09 LAB — MRSA NEXT GEN BY PCR, NASAL: MRSA by PCR Next Gen: NOT DETECTED

## 2021-08-09 LAB — CK: Total CK: 392 U/L (ref 49–397)

## 2021-08-09 LAB — OSMOLALITY: Osmolality: 305 mOsm/kg — ABNORMAL HIGH (ref 275–295)

## 2021-08-09 MED ORDER — SODIUM CHLORIDE 0.9 % IV SOLN: INTRAVENOUS | Status: DC

## 2021-08-09 MED ORDER — VANCOMYCIN VARIABLE DOSE PER UNSTABLE RENAL FUNCTION (PHARMACIST DOSING): Status: DC

## 2021-08-09 MED ORDER — LACTATED RINGERS IV BOLUS
500.0000 mL | Freq: Once | INTRAVENOUS | Status: AC
Start: 2021-08-09 — End: 2021-08-09
  Administered 2021-08-09: 500 mL via INTRAVENOUS

## 2021-08-09 MED ORDER — LORAZEPAM 0.5 MG PO TABS
0.5000 mg | ORAL_TABLET | Freq: Once | ORAL | Status: DC | PRN
  Filled 2021-08-09: qty 1

## 2021-08-09 MED ORDER — SODIUM CHLORIDE 0.9 % IV SOLN
2.0000 g | INTRAVENOUS | Status: DC
  Administered 2021-08-10: 2 g via INTRAVENOUS
  Filled 2021-08-09: qty 12.5

## 2021-08-09 MED ORDER — CHLORHEXIDINE GLUCONATE CLOTH 2 % EX PADS
6.0000 | MEDICATED_PAD | Freq: Every day | CUTANEOUS | Status: DC
  Administered 2021-08-09 – 2021-08-11 (×3): 6 via TOPICAL

## 2021-08-09 MED ORDER — MEDIHONEY WOUND/BURN DRESSING EX PSTE
1.0000 | PASTE | Freq: Every day | CUTANEOUS | Status: DC
Start: 2021-08-09 — End: 2021-08-17
  Administered 2021-08-09 – 2021-08-16 (×7): 1 via TOPICAL
  Filled 2021-08-09 (×7): qty 44

## 2021-08-09 MED ORDER — SODIUM CHLORIDE 0.9 % IV SOLN
8.0000 mg/kg | Freq: Every day | INTRAVENOUS | Status: DC
  Administered 2021-08-09: 600 mg via INTRAVENOUS
  Filled 2021-08-09: qty 12

## 2021-08-09 MED ORDER — SODIUM CHLORIDE 0.9 % IV BOLUS
500.0000 mL | Freq: Once | INTRAVENOUS | Status: AC
  Administered 2021-08-09: 500 mL via INTRAVENOUS

## 2021-08-09 MED ORDER — MORPHINE SULFATE (PF) 2 MG/ML IV SOLN: 1.0000 mg | Freq: Four times a day (QID) | INTRAVENOUS | Status: DC | PRN

## 2021-08-09 NOTE — ED Notes (Signed)
Family updated as to patient's status.Daughter-Cathy and son-in-law Mellody Dance at bedside ?

## 2021-08-09 NOTE — Progress Notes (Signed)
?Progress Note ? ? ?Patient: [name] [name] MRN:[id number] DOB: 09/13/1940 DOA: 08/08/2021     0 ?DOS: the patient was seen and examined on 08/09/2021 ?  ?Brief hospital course: ?No notes on file ? ?Assessment and Plan: ?No notes have been filed under this hospital service. ?Service: Hospitalist ? ? ?Sepsis ?- hypotension, AMS, renal failure, lower extremity wounds ?- will continue empiric Antibiotics ?Vanc4/10-4/11 ?Daptomycin 4/11- ?Zosyn 4/10- ?- will consult infectious disease for further input, I anticipate we will need furhter imaging, prefer MRI lower extremity. ?- low threshold for elevated level of care, unsure if this is early presentation of necrotizing fascitis. D/w ID. ? ? ?Acute renal failure ?Hyperkalemia ?Hyponatremia ?Hypomagnesemia ?Uremia ?> Patient presenting after being found to be doing generally unwell with worsening wounds during a wellness check. ?> Given significant elevated BUN to creatinine ratio suspect this is prerenal etiology from decreased p.o. intake. ?> Creatinine elevated to 2.74 from baseline of 1, BUN 117, sodium 126, potassium 6.8.  Magnesium 3.2. ?> Questionable mildly peaked T waves and mild prolongation of QRS at 115 ms.  Nonspecific ST changes.  T waves look similar to previous but QRS is slightly longer than previous which was 96. ?> Patient received a liter of fluids in the ED, albuterol, calcium gluconate, dextrose and insulin. ?- Requested Nephrology input given possible impending decline in renal function. Certainly not the best CRRT candidate but will d/w nephrology ?- continue Lokelma as well, not a candidate for loop diuretic given significant AKI ?- - Renal function panel every 3 hours to trend sodium and potassium ?- Goal 24-hour sodium of 132 or less ?  ?Venous stasis ?Leg wounds ?> Patient presenting with worsening of his chronic venous stasis wounds in the setting of no longer seeing wound care apparently overall taking care of himself. ?> Possible cellulitic  superinfection of his wounds started on vancomycin in the ED ?> As cefepime in the place of previously ordered Zosyn due to AKI ?> No leukocytosis currently but will check ESR and CRP. ?> X-ray indicates possible osteomyelitis though changes could be consistent with osteopenia as well ?- Monitor on progressive unit as above ?- ESR pending and CRP slightly elev, at 1.9 ?- Continue antibiotics with Dapto and cefepime for now ?- Trend fever curve and white count ?- WOC consult ?- Consider MRI,  ?  ?Atrial Fibrillation ?> Rate in the ED in the 110s to low 120s range.  Not on any rate control outpatient due to not tolerating beta-blockers or calcium channel blockers. ?> Has reportedly not been taking her medications we will reinitiate Eliquis but at 2.5 mg twice daily due to his AKI and age. ?- Continue Eliquis to 2.5 mg twice daily for now ?- If rate increased significantly to RVR with consider amiodarone given patient's history of intolerance of p.o. beta-blockers and calcium channel blockers per recent cardiology note. ?  ?DVT prophylaxis:      Eliquis ?Code Status:                         Full ?Family Communication:      None on admission.  Friend/family was here earlier and aware of his admission but had gone home. ?Disposition Plan:  ?            Patient is from:                        Home ?              Anticipated DC to:                   Home ?            Anticipated DC date:               1 to 5 days ?            Anticipated DC barriers:         None    ?Consults called:       None ?Admission status:    Observation, progressive ?  ?Severity of Illness: ?The appropriate patient status for this patient is inpatient stay ? ?  ?How to contact the [facility] Attending or Consulting provider 7A - 7P or covering provider during after hours 7P -7A, for this patient?  ?  ?Check the care team in [facility] and look for a) attending/consulting [facility] provider listed and b) the [facility] team listed ?Log into [email or web address] and use [facility]  universal password to access. If you do not have the password, please contact the hospital operator. ?Locate the [facility] provider you are looking for under [facility] and page to a number that you can be directly reached. ?If you still have difficulty reaching the provider, please page the DOC (Director on Call) for the Hospitalists listed on amion for assistance. ?  ?08/08/2021, 11:42 PM  ?  ? ? ?  ? ?Subjective: Seen on AM rounds, still slightly altered from baseline according to family. ?Denies pain. In discussion with family, there is acute change within the last week but definite concern that patient does not take care of himself, skipping meals. No obvious source of infection per family visits, but these are scarce.  ? ?Physical Exam: ?Vitals:  ? 08/09/21 1253 08/09/21 1608 08/09/21 1800 08/09/21 1810  ?BP: (!) 105/55 (!) 100/56  (!) 100/51  ?Pulse: 94 88 67 93  ?Resp: 14 18 18 19  ?Temp: 98.3 ?F (36.8 ?C)   98.1 ?F (36.7 ?C)  ?TempSrc: Oral Oral  Oral  ?SpO2: 99% 99% 100% 100%  ?Weight:      ?Height:      ? Constitutional:   ?   General: He is not in acute distress. ?   Appearance: Normal appearance.  ?HENT:  ?   Head: Normocephalic and atraumatic.  ?   Mouth/Throat:  ?   Mouth: Mucous membranes are moist.  ?   Pharynx: Oropharynx is clear.  ?Eyes:  ?   Extraocular Movements: Extraocular movements intact.  ?   Pupils: Pupils are equal, round, and reactive to light.  ?Cardiovascular:  ?   Rate and Rhythm: Normal rate and regular rhythm.  ?   Pulses: Normal pulses.  ?   Heart sounds: Normal heart sounds.  ?Pulmonary:  ?   Effort: Pulmonary effort is normal. No respiratory distress.  ?   Breath sounds: Normal breath sounds.  ?Abdominal:  ?   General: Bowel sounds are normal. There is no distension.  ?   Palpations: Abdomen is soft.  ?   Tenderness: There is no abdominal tenderness.  ?Musculoskeletal:     ?   General: Deformity present.  ?   Right lower leg: Edema present.  ?   Left lower leg: Edema present.   ?   Comments: Significant bilateral lower extremity wounds, history of venous stasis, possible cellulitic superinfection.  ?Skin: ?   General: Skin is warm and dry.  ?Neurological:  ?   General: No focal deficit   present.  ?   Mental Status: Mental status is at baseline.  ? ?Family Communication: bedside ? ?Disposition: ?Status is: Inpatient ?Remains inpatient appropriate because: AMS, infection, lower extrem wounds ? Planned Discharge Destination: SNF ? ? ? ?Time spent: 40 minutes ? ?Author: ? , MD ?08/09/2021 6:28 PM ? ?For on call review [email or web address].  ?

## 2021-08-09 NOTE — ED Notes (Signed)
Pt daughter Tye [place] and Shelia Media, Lanny Hurst at bedside updated about pt condition and poc.  ?

## 2021-08-09 NOTE — Consult Note (Signed)
WOC Nurse Consult Note: ?Patient receiving care in [facility] ED 17. Consult completed remotely after review of record and images of BLE. ?Reason for Consult: wounds BLE ?Wound type: unclear at this time.  ?Pressure Injury POA: Yes/No/NA ?Measurement: ?Wound bed: see photo ?Drainage (amount, consistency, odor) dark, crusty ?Periwound:  ?Dressing procedure/placement/frequency: ?Thoroughly wash BLE and feet with copious soap and water. Rinse, pat dry. Apply a nickel thick layer of MediHoney directly to the wounds, then cover with gauze and secure with kerlix. Change dressing daily. ? ?I have also ordered a bed with low air loss mattress and bilateral Prevalon heel lift boots. ? ?I have placed the patient on the [facility] f/u list for re-evaluation in one week to give the ordered therapy time to have effect. ? ?Monitor the wound area(s) for worsening of condition such as: ?Signs/symptoms of infection,  ?Increase in size,  ?Development of or worsening of odor, ?Development of pain, or increased pain at the affected locations.  Notify the medical team if any of these develop. ? ?Thank you for the consult. ? ?Val Riles, RN, MSN, CWOCN, CNS-BC, pager [phone]  ?  ?

## 2021-08-09 NOTE — Consult Note (Addendum)
? ? ? ?Date of Admission:  08/08/2021    ?      ?Reason for Consult: ? Sepsis + chronic unusual open wounds   ?Referring Provider: Vanna Scotland, MD ? ? ?Assessment: ? ?? Superimposed bacterial infection with septic physiology ?Chronic ulcerated leg wounds--differential would include autoimmune pathology such as pyoderma, unsusual infections such as fungal, mnycobacterial or nocardial--I doubt these as being ID causes in THIS patient, vs complicated situaiton where prior venous stasis dermatitis and eczema in context of Neuropsychiatric problems including potentially scratching the wounds, neglect ?Neuropsychiatric pathology with cognitive isssus, lack of self care, hoarding severe depression in context of his double life that was recently discovered ?Complicated social situation where he had been married to his (now ex) wife, and their daughters, eldest Jaquari Fallen (phone [phone]) , [name] [name] (daughter) along with a second life where Greyson Sobotta (mistress) posed also as his wife along with his child Tyra Gaither [phone]) ?CAD ?Atrial fibrillation ? ?Plan: ? ?Change vancomycin to daptomycin to avoid nephrotoxic, antibiotic, keep cefepime for now though I will likely DC it soon ?Followup-blood cultures ?In terms of elucidating the pathology in his legs he would benefit from biopsy with a specimen to pathology and one to microbiology lab for AFB and fungal cultures (despite my doubting they are in play ?He may benefit from transfer to tertiary care center with dermatology and rheumatology in place ?Auto--immune labs. Vasculitis workup ?I will check RPR, HIV ?Would consult Neurology ?"" Psychiatry re his competency and also ? He is suffering psychotic break in above context ?His relations to actual family members and points of contact will need to be cleared up in the chart Eisa Guerette would appear to be [facility] and fortunately the daughters from the different marriages appear to be working together  though this is a very complicated situation ? ?Principal Problem: ?  Acute renal failure ([place]) ?Active Problems: ?  Persistent atrial fibrillation ([place]) ?  Hyperkalemia ?  Hypermagnesemia ?  Uremia ?  Hyponatremia ?  Renal failure ? ? ?Scheduled Meds: ? apixaban  2.5 mg Oral BID  ? leptospermum manuka honey  1 application. Topical Daily  ? sodium chloride flush  3 mL Intravenous Q12H  ? sodium zirconium cyclosilicate  10 g Oral BID  ? ?Continuous Infusions: ? sodium chloride 100 mL/hr at 08/09/21 1618  ? [START ON 08/10/2021] ceFEPime (MAXIPIME) IV    ? DAPTOmycin (CUBICIN)  IV    ? ?PRN Meds:.acetaminophen **OR** acetaminophen, morphine injection, polyethylene glycol ? ?HPI: [name] [name] is a 81 y.o. 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 ? ?In talking with family members who provided the majority of the history in addition to review of chart the patient has been suffering from venous stasis dermatitis and eczema for many years and had been followed by wound care with Dr. Dellia Nims and then Jeri Cos, PA for these wounds. ? ?Note he has a very complicated social, and marital and family history. ? ?This is highly relevant to the recent past and also future decisions going forward. ? ?He had been married  legally to his wife who recently divorced him in 2021 and had 2 children from that marriage [name] [name] and [name] [name] but he had not lived with his wife for several decades. He had in the interim apparently not  legally married Berlon Wirthlin who has posed as his wife and is listed frequently int he chart as his wife and had another daguther Shan Levans from this relationship. ? ?Apparently during the pandemic he returned to living with his legal wife and had been staying there though per his daughter [name] [name] it appeared paranoid and secretive at times and ultimately the second relationship with Edword Pridgeon and the other daughter and the entire second life that his first family does not know about was revealed. ? ?This precipitated a divorce from his legal wife and severe depression on the part of [name] [name]. ? 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 ? ?It is not clear to me if anyone else in fact works at this office or lives there but the patient does live in this warehouse. ? ?Talking to his daughters both Juliann Pulse and [name] [name] the patient has been staying there for at least a year. ? ?Apparently he has been hoarding materials in the place where he has been living in the warehouse is infested with flies and is very unsanitary. ? ?His mistress (who posed as wife and is referred to as his wife) had been quite concerned about him and had been trying to make sure that he was taking his medications with verbal reminders over the phone. ? ?His daughter [name] [name] had been quite concerned recently that the patient could have suffered a stroke and she felt that he had worsening cognition.  She had encouraged him to seek medical care which did not happen until he finally was brought to the emergency department at [facility]. ? ?In the emergency department he was found to be hypothermic with a temperature of 97.5 and tachycardic with atrial fibrillation and a blood pressure in the 100s to 120s. ? ?He had acute renal failure along with hyponatremia and new thrombocytopenia. ? ?Blood cultures were drawn there was concern for sepsis from his wounds and he was started on broad coverage  with vancomycin and cefepime. ? ?There was report that I received over the phone that there was some question of whether maggots have been found in one of his many wounds in his lower extremities. ? ?The patient himself is not known to be immunosuppressed with no diagnosed autoimmune condition. ? ?Apart from his known coronary artery disease and atrial fibrillation he was not known to have many medical problems and was apparently quite eager to be proactive in his physical health. ? ?He had no history of substance abuse whatsoever. ? ?In looking at his case I think is certainly possible that he could have an acute bacterial infection due to the extensive wounds in his legs that would serve as a portal for bacteria to invade and potentially even cause bacteremia though his blood cultures in the first 12 hours have been without growth. ? ?In terms of the cause of his chronic lower extremity ulcers that are fairly extensive with areas of intense hyperpigmentation which raise question of vasculitis I do not think that they are due to a typical pyogenic bacterial infection. ? ?It is conceivable that he could have an infection with an unusual organism such as nocardia, actinomyces or dimorphic fungus or mycobacterial species though I  am skeptical that he would have any of these. ? ?The appearance of his wounds could also look quite consistent with a autoimmune process such as pyoderma gangrenosum or a vasculitis. ? ?It could also be that the wounds have the appearance that they have in the context of a person who had venous stasis dermatitis and eczema but you may have scratched at the wound and during a time period where he was suffering significant neuropsychiatric decline and not caring for himself these wounds could have certainly deteriorated.  If they are in fact maggots there is no question of that. ? ?For now I would continue him on coverage for MRSA but shifts from vancomycin to daptomycin.  We will continue  cefepime but I doubt he really needs antipseudomonal coverage. ? ?We will follow-up his blood cultures. ? ?In terms of the pathology of his lower extremities a biopsy from surgery sent to pathology for p

## 2021-08-09 NOTE — ED Notes (Signed)
Oldest daughter called and wanted to be called if her father passes. Muhanad Bjornson [phone] ?

## 2021-08-09 NOTE — Consult Note (Signed)
[facility]  ?HISTORY AND PHYSICAL ? ?[name] [name] is an 81 y.o. male.   ? ?Chief Complaint: FTT and leg wounds  ? ?HPI: Pt is an 50M with a PMH sig for HTN, HLD, Afib, CAD, and stasis dermatitis who is now seen in consultation at the request of Dr Guss Bunde for eval and recs re: AKI.   ?Briefly, pt was brought in to [facility] ED by ex-wife who had been checking in on him by phone but hadn't seen him since before Christmas.  She started noticing that his voice seemed different on the phone so physically checked in on him.  She found leg wounds and a general decline and so brought him to the ED. ? ?There Cr 2.74, K 6.8, BUN 117.  Was given IVFs and treated aggressively for high K.  Blood cultures drawn, given vanc/ cefepime which was changed to dapto/ cefepime.   ? ?In this setting we are asked to see.   ? ?Pt reports decreased mobility, hasn't been taking care of himself it appears for quite some time.  He states that he fixes his own food but can't tell me when the last time he did that was.  Looks like he's on Eliquis as OP but unclear if he's been taking medication or not.  No nephrotoxic agents are on his med list.  He says that his legs Amiere't hurt.   ? ?PMH: ?Past Medical History:  ?Diagnosis Date  ? CAD (coronary artery disease)   ? LHC 11/06: pLAD 90, pD1 30, oD2 25 with inf branch 60; septal perf ostial 95; prox MOM 25, RCA 25, oPL2 95, EF 65 >> PCI:  BMS to proximal LAD   ? FH: CAD (coronary artery disease)   ? History of echocardiogram   ? Echo 3/17:  Mod LVH, EF 55-60, no RWMA, mild AI, mild to mod RAE  ? History of tobacco abuse   ? Hyperlipidemia   ? Persistent atrial fibrillation (HCC)   ? Xarelto for a/c  ? ?PSH: ?Past Surgical History:  ?Procedure Laterality Date  ? CARDIAC CATHETERIZATION    ? CORONARY STENT PLACEMENT    ? stents of the proximal lad with bare metal stent  ? PERCUTANEOUS CORONARY STENT INTERVENTION (PCI-S)    ? ? ? ? ?Past Medical History:  ?Diagnosis Date  ? CAD (coronary artery  disease)   ? LHC 11/06: pLAD 90, pD1 30, oD2 25 with inf branch 60; septal perf ostial 95; prox MOM 25, RCA 25, oPL2 95, EF 65 >> PCI:  BMS to proximal LAD   ? FH: CAD (coronary artery disease)   ? History of echocardiogram   ? Echo 3/17:  Mod LVH, EF 55-60, no RWMA, mild AI, mild to mod RAE  ? History of tobacco abuse   ? Hyperlipidemia   ? Persistent atrial fibrillation (HCC)   ? Xarelto for a/c  ? ? ?Medications: ? Prior to Admission: (Not in a hospital admission)  ? ?(Not in a hospital admission) ? ? ?ALLERGIES: ? No Known Allergies ? ?FAM HX: ?Family History  ?Problem Relation Age of Onset  ? Cancer - Colon Mother   ? Heart attack Father   ? Hypertension Father   ? ? ?Social History:  ? reports that he quit smoking about 43 years ago. His smoking use included cigarettes. He has never used smokeless tobacco. He reports that he does not drink alcohol and does not use drugs. ? ?ROS: ?ROS: all other systems reviewed and  are negative as per HPI ? ?Blood pressure (!) 105/55, pulse 94, temperature 98.3 ?F (36.8 ?C), temperature source Oral, resp. rate 14, height 5\' 11"  (1.803 m), weight 74.8 kg, SpO2 99 %. ?PHYSICAL EXAM: ?Physical Exam ?GEN NAD, sitting in bed, disheveled ?HEENT EOMI PERRL dry MM ?NECK flat neck veins ?PULM clear ?CV RRR ?ABD soft  ?EXT bilateral malodorous extensive wounds shin down with erosion and buildup of discolored dead skin.  Nails are untrimmed ?NEURO AAO x 3 ? ?  ?Results for orders placed or performed during the hospital encounter of 08/08/21 (from the past 48 hour(s))  ?CBC with Differential     Status: Abnormal  ? Collection Time: 08/08/21  9:00 PM  ?Result Value Ref Range  ? WBC 7.5 4.0 - 10.5 K/uL  ? RBC 5.17 4.22 - 5.81 MIL/uL  ? Hemoglobin 14.5 13.0 - 17.0 g/dL  ? HCT 43.3 39.0 - 52.0 %  ? MCV 83.8 80.0 - 100.0 fL  ? MCH 28.0 26.0 - 34.0 pg  ? MCHC 33.5 30.0 - 36.0 g/dL  ? RDW 15.9 (H) 11.5 - 15.5 %  ? Platelets 146 (L) 150 - 400 K/uL  ? nRBC 0.0 0.0 - 0.2 %  ? Neutrophils Relative %  83 %  ? Neutro Abs 6.2 1.7 - 7.7 K/uL  ? Lymphocytes Relative 7 %  ? Lymphs Abs 0.5 (L) 0.7 - 4.0 K/uL  ? Monocytes Relative 9 %  ? Monocytes Absolute 0.6 0.1 - 1.0 K/uL  ? Eosinophils Relative 0 %  ? Eosinophils Absolute 0.0 0.0 - 0.5 K/uL  ? Basophils Relative 0 %  ? Basophils Absolute 0.0 0.0 - 0.1 K/uL  ? Immature Granulocytes 1 %  ? Abs Immature Granulocytes 0.07 0.00 - 0.07 K/uL  ?  Comment: Performed at [facility], [address], [place], [place]  ?Comprehensive metabolic panel     Status: Abnormal  ? Collection Time: 08/08/21  9:00 PM  ?Result Value Ref Range  ? Sodium 126 (L) 135 - 145 mmol/L  ? Potassium 6.8 (HH) 3.5 - 5.1 mmol/L  ?  Comment: NO VISIBLE HEMOLYSIS ? SONYA LAMB RN 08/08/21 @ 2204 VS ?  ? Chloride 96 (L) 98 - 111 mmol/L  ? CO2 20 (L) 22 - 32 mmol/L  ? Glucose, Bld 101 (H) 70 - 99 mg/dL  ?  Comment: Glucose reference range applies only to samples taken after fasting for at least 8 hours.  ? BUN 117 (H) 8 - 23 mg/dL  ?  Comment: RESULTS CONFIRMED BY MANUAL DILUTION  ? Creatinine, Ser 2.74 (H) 0.61 - 1.24 mg/dL  ? Calcium 9.1 8.9 - 10.3 mg/dL  ? Total Protein 7.7 6.5 - 8.1 g/dL  ? Albumin 3.2 (L) 3.5 - 5.0 g/dL  ? AST 31 15 - 41 U/L  ? ALT 20 0 - 44 U/L  ? Alkaline Phosphatase 98 38 - 126 U/L  ? Total Bilirubin 0.6 0.3 - 1.2 mg/dL  ? GFR, Estimated 23 (L) >60 mL/min  ?  Comment: (NOTE) ?Calculated using the CKD-EPI Creatinine Equation (2021) ?  ? Anion gap 10 5 - 15  ?  Comment: Performed at [facility], [address], [place], [place]  ?Lactic acid, plasma     Status: None  ? Collection Time: 08/08/21  9:00 PM  ?Result Value Ref Range  ? Lactic Acid, Venous 1.6 0.5 - 1.9 mmol/L  ?  Comment: Performed at [facility],  [address] [place], [place] [address]  ?Troponin I (High Sensitivity)     Status: None  ? Collection Time: 08/08/21  9:00 PM  ?Result Value Ref Range  ? Troponin I (High Sensitivity) 9 <18 ng/L  ?   Comment: (NOTE) ?Elevated high sensitivity troponin I (hsTnI) values and significant  ?changes across serial measurements may suggest ACS but many other  ?chronic and acute conditions are known to elevate hsTnI results.  ?Refer to the "Links" section for chest pain algorithms and additional  ?guidance. ?Performed at [facility], [address] ?[place], [place] [address] ?  ?Brain natriuretic peptide     Status: None  ? Collection Time: 08/08/21  9:00 PM  ?Result Value Ref Range  ? B Natriuretic Peptide 39.3 0.0 - 100.0 pg/mL  ?  Comment: Performed at [facility], [address] [place], [place] [address]  ?Magnesium     Status: Abnormal  ? Collection Time: 08/08/21  9:00 PM  ?Result Value Ref Range  ? Magnesium 3.2 (H) 1.7 - 2.4 mg/dL  ?  Comment: Performed at [facility], [address] [place], [place] [address]  ?Resp Panel by RT-PCR (Flu A&B, Covid) Nasopharyngeal Swab     Status: None  ? Collection Time: 08/09/21 12:19 AM  ? Specimen: Nasopharyngeal Swab; Nasopharyngeal(NP) swabs in vial transport medium  ?Result Value Ref Range  ? SARS Coronavirus 2 by RT PCR NEGATIVE NEGATIVE  ?  Comment: (NOTE) ?SARS-CoV-2 target nucleic acids are NOT DETECTED. ? ?The SARS-CoV-2 RNA is generally detectable in upper respiratory ?specimens during the acute phase of infection. The lowest ?concentration of SARS-CoV-2 viral copies this assay can detect is ?138 copies/mL. A negative result does not preclude SARS-Cov-2 ?infection and should not be used as the sole basis for treatment or ?other patient management decisions. A negative result may occur with  ?improper specimen collection/handling, submission of specimen other ?than nasopharyngeal swab, presence of viral mutation(s) within the ?areas targeted by this assay, and inadequate number of viral ?copies(<138 copies/mL). A negative result must be combined with ?clinical observations, patient history, and  epidemiological ?information. The expected result is Negative. ? ?Fact Sheet for Patients:  ?BloggerCourse.comhttps://www.fda.gov/media/152166/download ? ?Fact Sheet for Healthcare Providers:  ?https://www.young.biz/https://www.fda.gov/media/152

## 2021-08-09 NOTE — ED Notes (Signed)
BLE and feet washed with soap and water. MediHoney applied to legs covered with abd pads. Per Dr. Darden Palmer hold off on bandage until Infectious disease sees patient.  ?

## 2021-08-09 NOTE — Progress Notes (Signed)
Pharmacy Antibiotic Note ? ?[name] [name] is a 81 y.o. male admitted on 08/08/2021 with  wound infection, possible osteomyelitis .  Pharmacy has been consulted for Cefepime + Vancomycin dosing. ? ?AKI on admission; baseline Scr ~ 1.0 ? ?Plan: ?Cefepime 2gm IV q24h ?Vancomycin 1500mg  IV x1.  Will check random level in ~48h and re-dose as needed ?Monitor renal function and cx data  ? ?Height: 5\' 11"  (180.3 cm) ?Weight: 74.8 kg (165 lb) ?IBW/kg (Calculated) : 75.3 ? ?Temp (24hrs), Avg:97.5 ?F (36.4 ?C), Min:97.5 ?F (36.4 ?C), Max:97.5 ?F (36.4 ?C) ? ?Recent Labs  ?Lab 08/08/21 ?2100  ?WBC 7.5  ?CREATININE 2.74*  ?LATICACIDVEN 1.6  ?  ?Estimated Creatinine Clearance: 22.7 mL/min (A) (by C-G formula based on SCr of 2.74 mg/dL (H)).   ? ?No Known Allergies ? ?Antimicrobials this admission: ?4/11 Cefepime >>  ?4/11 Vancomycin >>  ? ?Dose adjustments this admission: ? ?Microbiology results: ?4/10 BCx:  ? ?Thank you for allowing pharmacy to be a part of this patient?s care. ? ?6/11 PharmD ?08/09/2021 12:01 AM ? ?

## 2021-08-09 NOTE — ED Notes (Signed)
Dr. Lauro Franklin at bedside updating family. ?

## 2021-08-09 NOTE — Plan of Care (Signed)
Discussed with patient plan of care, pain management and admission questions with some teach back displayed.  Patient's daughter called to check up on him but he was asleep at the time.  Patient has safety mitts placed on due to waking up confused. ? ?Problem: Education: ?Goal: Knowledge of General Education information will improve ?Description: Including pain rating scale, medication(s)/side effects and non-pharmacologic comfort measures ?Outcome: Not Progressing ?  ?Problem: Coping: ?Goal: Level of anxiety will decrease ?Outcome: Not Progressing ?  ?

## 2021-08-10 ENCOUNTER — Inpatient Hospital Stay (HOSPITAL_COMMUNITY): Payer: Medicare Other

## 2021-08-10 DIAGNOSIS — F03B18 Unspecified dementia, moderate, with other behavioral disturbance: Secondary | ICD-10-CM

## 2021-08-10 DIAGNOSIS — I351 Nonrheumatic aortic (valve) insufficiency: Secondary | ICD-10-CM

## 2021-08-10 DIAGNOSIS — N179 Acute kidney failure, unspecified: Secondary | ICD-10-CM | POA: Diagnosis not present

## 2021-08-10 DIAGNOSIS — T1490XA Injury, unspecified, initial encounter: Secondary | ICD-10-CM

## 2021-08-10 DIAGNOSIS — E871 Hypo-osmolality and hyponatremia: Secondary | ICD-10-CM | POA: Diagnosis not present

## 2021-08-10 DIAGNOSIS — F333 Major depressive disorder, recurrent, severe with psychotic symptoms: Secondary | ICD-10-CM

## 2021-08-10 DIAGNOSIS — I4819 Other persistent atrial fibrillation: Secondary | ICD-10-CM | POA: Diagnosis not present

## 2021-08-10 LAB — RENAL FUNCTION PANEL
Albumin: 2.2 g/dL — ABNORMAL LOW (ref 3.5–5.0)
Albumin: 2.3 g/dL — ABNORMAL LOW (ref 3.5–5.0)
Albumin: 2.2 g/dL — ABNORMAL LOW (ref 3.5–5.0)
Albumin: 2.2 g/dL — ABNORMAL LOW (ref 3.5–5.0)
Anion gap: 8 (ref 5–15)
Anion gap: 8 (ref 5–15)
Anion gap: 8 (ref 5–15)
Anion gap: 6 (ref 5–15)
BUN: 64 mg/dL — ABNORMAL HIGH (ref 8–23)
BUN: 63 mg/dL — ABNORMAL HIGH (ref 8–23)
BUN: 57 mg/dL — ABNORMAL HIGH (ref 8–23)
BUN: 56 mg/dL — ABNORMAL HIGH (ref 8–23)
CO2: 16 mmol/L — ABNORMAL LOW (ref 22–32)
CO2: 15 mmol/L — ABNORMAL LOW (ref 22–32)
CO2: 15 mmol/L — ABNORMAL LOW (ref 22–32)
CO2: 16 mmol/L — ABNORMAL LOW (ref 22–32)
Calcium: 8.2 mg/dL — ABNORMAL LOW (ref 8.9–10.3)
Calcium: 8.3 mg/dL — ABNORMAL LOW (ref 8.9–10.3)
Calcium: 8.2 mg/dL — ABNORMAL LOW (ref 8.9–10.3)
Calcium: 8.2 mg/dL — ABNORMAL LOW (ref 8.9–10.3)
Chloride: 108 mmol/L (ref 98–111)
Chloride: 109 mmol/L (ref 98–111)
Chloride: 110 mmol/L (ref 98–111)
Chloride: 112 mmol/L — ABNORMAL HIGH (ref 98–111)
Creatinine, Ser: 1.44 mg/dL — ABNORMAL HIGH (ref 0.61–1.24)
Creatinine, Ser: 1.41 mg/dL — ABNORMAL HIGH (ref 0.61–1.24)
Creatinine, Ser: 1.38 mg/dL — ABNORMAL HIGH (ref 0.61–1.24)
Creatinine, Ser: 1.34 mg/dL — ABNORMAL HIGH (ref 0.61–1.24)
GFR, Estimated: 49 mL/min — ABNORMAL LOW (ref >60)
GFR, Estimated: 50 mL/min — ABNORMAL LOW (ref >60)
GFR, Estimated: 52 mL/min — ABNORMAL LOW (ref >60)
GFR, Estimated: 54 mL/min — ABNORMAL LOW (ref >60)
Glucose, Bld: 81 mg/dL (ref 70–99)
Glucose, Bld: 75 mg/dL (ref 70–99)
Glucose, Bld: 78 mg/dL (ref 70–99)
Glucose, Bld: 78 mg/dL (ref 70–99)
Phosphorus: 3.8 mg/dL (ref 2.5–4.6)
Phosphorus: 4 mg/dL (ref 2.5–4.6)
Phosphorus: 3.9 mg/dL (ref 2.5–4.6)
Phosphorus: 3.9 mg/dL (ref 2.5–4.6)
Potassium: 3.6 mmol/L (ref 3.5–5.1)
Potassium: 3.6 mmol/L (ref 3.5–5.1)
Potassium: 3.6 mmol/L (ref 3.5–5.1)
Potassium: 3.8 mmol/L (ref 3.5–5.1)
Sodium: 132 mmol/L — ABNORMAL LOW (ref 135–145)
Sodium: 132 mmol/L — ABNORMAL LOW (ref 135–145)
Sodium: 133 mmol/L — ABNORMAL LOW (ref 135–145)
Sodium: 134 mmol/L — ABNORMAL LOW (ref 135–145)

## 2021-08-10 LAB — ECHOCARDIOGRAM COMPLETE
Calc EF: 62.9 %
P 1/2 time: 453 msec
S' Lateral: 3.4 cm
Single Plane A2C EF: 61.8 %
Single Plane A4C EF: 61.7 %

## 2021-08-10 LAB — HEPATITIS PANEL, ACUTE
HCV Ab: NONREACTIVE
Hep A IgM: NONREACTIVE
Hep B C IgM: NONREACTIVE
Hepatitis B Surface Ag: NONREACTIVE

## 2021-08-10 LAB — CRYPTOCOCCAL ANTIGEN: Crypto Ag: NEGATIVE

## 2021-08-10 LAB — HIV ANTIBODY (ROUTINE TESTING W REFLEX): HIV Screen 4th Generation wRfx: NONREACTIVE

## 2021-08-10 LAB — CK: Total CK: 863 U/L — ABNORMAL HIGH (ref 49–397)

## 2021-08-10 LAB — RPR: RPR Ser Ql: NONREACTIVE

## 2021-08-10 LAB — C-REACTIVE PROTEIN: CRP: 7.3 mg/dL — ABNORMAL HIGH (ref <1.0)

## 2021-08-10 MED ORDER — SODIUM CHLORIDE 0.9 % IV SOLN
3.0000 g | Freq: Four times a day (QID) | INTRAVENOUS | Status: DC
  Administered 2021-08-10 – 2021-08-15 (×20): 3 g via INTRAVENOUS
  Filled 2021-08-10 (×21): qty 8

## 2021-08-10 MED ORDER — SODIUM CHLORIDE 0.9 % IV SOLN: INTRAVENOUS | Status: AC

## 2021-08-10 NOTE — Hospital Course (Signed)
81 y.o. male with medical history significant of atrial fibrillation, CAD, hyperlipidemia, venous stasis, retinopathy presenting with general decline and worsening leg wound. ? ?Some history obtained with assistance of chart review.  Friends were previously here providing history but have gone home.  Patient has known history of chronic lower extremity wounds from venous stasis.  Had previously been seeing wound care clinic but it stopped taking care of himself and had not been going to the clinic.  He has been living alone for some time following a recent divorce.  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  His previous ex-wife has been checking on him by phone but had not seen him in some time.  Patient was checked on for well check today and found to have general decline and significant worsening of his leg wounds. ? ?4/11- remains confused, lower extremity wounds evaluated. ID consulted for assistance given FTT and severity of infection. Renal consulted given impending renal failure, ARF seems to be more pre-renal FTT than sepsis. ? ?4/12 seen at bedside, awaiting MRI. Antibiotics on board. He is slightly improving still confused, but not delirious. Mittens in place. Kidney function improving slowly. Making good urine output. Leg wounds requested evaluation with wound care team.  ?

## 2021-08-10 NOTE — Progress Notes (Signed)
Subjective:  1850 of UOP but still at least a liter positive -  labs have improved remarkably with management to date -  BP still soft-  no BP meds -  he is alert- [facility] but doesn't remember coming here  ? ?Objective ?Vital signs in last 24 hours: ?Vitals:  ? 08/10/21 0900 08/10/21 1000 08/10/21 1100 08/10/21 1143  ?BP: (!) 105/51 (!) 96/43 (!) 114/43   ?Pulse: (!) 109 100 87   ?Resp: (!) 21 16 20    ?Temp:    (!) 97.5 ?F (36.4 ?C)  ?TempSrc:    Skin  ?SpO2: 100% 100% 97%   ?Weight:      ?Height:      ? ?Weight change: -7.144 kg ? ?Intake/Output Summary (Last 24 hours) at 08/10/2021 1208 ?Last data filed at 08/10/2021 1100 ?Gross per 24 hour  ?Intake 2100.45 ml  ?Output 2500 ml  ?Net -399.55 ml  ? ? ?Assessment/Plan ?  ? AKI: appears to be pre-renal from hypovolemia +/- sepsis in the setting of leg wounds and FTT.  Already improving with IVFs ?- reordered NS @ 100 mL/ hr-  would continue as he certainly is not wet ?- creatinine is 1.0 at baseline but very close ?  ?2.  Hyperkalemia:  ?            - aggressive rx, now normalized ?            - stopped lokelma ?  ?3.  Sepsis ?            - leg wounds ?            - blood cultures drawn and antibiotics started- dapto/ cefepime now unasyn ?            - Xrays suggest possible osteo- ID consulted ?  ?4.  FTT ?            - ? Depression ?            - he will likely need SNF ?  ?5.  Hyponatremia ?            - likely hypovolemic hyponatremia ?            - expect to correct with IVFs-  it has  ? ?6. Metabolic acidosis-  will give sodium bicarb ? ?Renal numbers and sodium much improved with just IVF-  he still does not appear wet so will give another 24 hours of fluids-  decreased frequency of labs.  Anticipate further improvement and no action needed on my end-  renal will sign off -  call us back if needed -  can stop bicarb once serum bicarb is over 23  ? ? ?Cecille AverKellie A Jader Desai  ? ? ?Labs: ?Basic Metabolic Panel: ?Recent Labs  ?Lab 08/10/21 ?0616  08/10/21 ?0806 08/10/21 ?1133  ?NA 132* 133* 134*  ?K 3.6 3.6 3.8  ?CL 109 110 112*  ?CO2 15* 15* 16*  ?GLUCOSE 75 78 78  ?BUN 63* 57* 56*  ?CREATININE 1.41* 1.38* 1.34*  ?CALCIUM 8.3* 8.2* 8.2*  ?PHOS 4.0 3.9 3.9  ? ?Liver Function Tests: ?Recent Labs  ?Lab 08/08/21 ?2100 08/09/21 ?[id number] 08/10/21 ?[id number] 08/10/21 ?[id number] 08/10/21 ?1133  ?AST 31  --   --   --   --   ?ALT 20  --   --   --   --   ?ALKPHOS 98  --   --   --   --   ?  BILITOT 0.6  --   --   --   --   ?PROT 7.7  --   --   --   --   ?ALBUMIN 3.2*   < > 2.3* 2.2* 2.2*  ? < > = values in this interval not displayed.  ? ?No results for input(s): LIPASE, AMYLASE in the last 168 hours. ?No results for input(s): AMMONIA in the last 168 hours. ?CBC: ?Recent Labs  ?Lab 08/08/21 ?2100 08/09/21 ?1497  ?WBC 7.5 7.0  ?NEUTROABS 6.2  --   ?HGB 14.5 11.2*  ?HCT 43.3 33.1*  ?MCV 83.8 83.4  ?PLT 146* 130*  ? ?Cardiac Enzymes: ?Recent Labs  ?Lab 08/09/21 ?0930 08/10/21 ?0258  ?CKTOTAL 392 863*  ? ?CBG: ?No results for input(s): GLUCAP in the last 168 hours. ? ?Iron Studies: No results for input(s): IRON, TIBC, TRANSFERRIN, FERRITIN in the last 72 hours. ?Studies/Results: ?DG Chest 1 View ? ?Result Date: 08/08/2021 ?CLINICAL DATA:  Atrial fibrillation, shortness of breath EXAM: CHEST  1 VIEW COMPARISON:  Chest radiograph dated March 12, 2005 FINDINGS: The heart size and mediastinal contours are within normal limits. Atherosclerotic calcification of aortic arch. Elevation of the left hemidiaphragm. Lungs otherwise clear without evidence of focal consolidation or large pleural effusion. Left costophrenic angle is not included. IMPRESSION: 1. Elevation of the left hemidiaphragm. Left costophrenic angle is not included. No focal consolidation or large pleural effusion. 2. Heart is normal in size. Atherosclerotic calcification of aortic arch. Electronically Signed   By: Larose Hires D.O.   On: 08/08/2021 22:43  ? ?DG Tibia/Fibula Left ? ?Result Date: 08/08/2021 ?CLINICAL DATA:   Bilateral lower leg wounds. Necrosis and weeping wounds entire both tibia/fibula and feet. EXAM: LEFT TIBIA AND FIBULA - 2 VIEW COMPARISON:  None. FINDINGS: There is no evidence of fracture or other focal bone lesions. No cortical erosion or periosteal reaction. Skin thickening and irregularity consistent with multiple skin consistent with patient's history. IMPRESSION: No acute osseous abnormality. No radiographic evidence of osteomyelitis. Skin irregularity consistent with history of skin wounds. Electronically Signed   By: Larose Hires D.O.   On: 08/08/2021 22:46  ? ?DG Tibia/Fibula Right ? ?Result Date: 08/08/2021 ?CLINICAL DATA:  Bilateral lower extremity skin wounds. EXAM: RIGHT TIBIA AND FIBULA - 2 VIEW COMPARISON:  None. FINDINGS: There is no evidence of fracture. No cortical erosion or periosteal reaction to suggest acute osteomyelitis. Skin irregularity consistent with history of multiple skin wounds. IMPRESSION: 1. No cortical erosion or periosteal reaction to suggest acute osteomyelitis. 2. Skin irregularity consistent with history of skin wounds. Electronically Signed   By: Larose Hires D.O.   On: 08/08/2021 22:48  ? ?US RENAL ? ?Result Date: 08/09/2021 ?CLINICAL DATA:  Acute kidney injury EXAM: RENAL / URINARY TRACT ULTRASOUND COMPLETE COMPARISON:  None. FINDINGS: Right Kidney: Renal measurements: 9.3 x 4.8 x 6.1 cm = volume: 136 mL. Suboptimal visualization of the right kidney. Increased echogenicity of the cortex. Mild hydronephrosis. No mass. Left Kidney: Renal measurements: 10.7 x 5.7 x 5.2 cm = volume: 158 mL. Suboptimal evaluation left kidney due to bowel gas. Increased echogenicity of the cortex. Mild left hydronephrosis. No mass lesion. Bladder: Appears normal for degree of bladder distention. Other: Limited imaging of the liver.  Possible biliary dilatation. IMPRESSION: Suboptimal renal evaluation due to body habitus and bowel gas. Mild bilateral hydronephrosis Question biliary dilatation.   Correlate with liver function tests. Electronically Signed   By: Marlan Palau M.D.   On: 08/09/2021 13:26  ? ?  US Abdomen Limited ? ?Result Date: 08/09/2021 ?CLINICAL DATA:  Question biliary dilatation on renal ultrasound today EXAM: ULTRASOUND ABDOMEN LIMITED RIGHT UPPER QUADRANT COMPARISON:  Renal ultrasound 08/09/2021 FINDINGS: Gallbladder: No gallstones or wall thickening visualized. No sonographic Murphy sign noted by sonographer. Common bile duct: Diameter: 2.8 mm. Liver: No focal lesion identified. Within normal limits in parenchymal echogenicity. Portal vein is patent on color Doppler imaging with normal direction of blood flow towards the liver. Other: Exam is limited by body habitus and bowel gas. Prominent portal venous system accounts for the finding on renal ultrasound. No biliary dilatation IMPRESSION: Negative for gallstones or biliary dilatation. Prominent portal venous system.  Question portal hypertension Electronically Signed   By: Marlan Palau M.D.   On: 08/09/2021 14:29  ? ?DG Foot 2 Views Left ? ?Result Date: 08/08/2021 ?CLINICAL DATA:  Bilateral lower extremity wounds. EXAM: LEFT FOOT - 2 VIEW COMPARISON:  None. FINDINGS: There is no evidence of fracture or dislocation. There is osseous erosion or severe osteopenia of the tibial aspect of the fifth metatarsal head. Marked skin thickening and subcutaneous soft tissue edema prominent about the dorsum of the foot. There is calcification of the nail beds. IMPRESSION: Osseous erosion or severe osteopenia about the tibial aspect of the fifth metatarsal head. In the presence of multiple skin wounds osteomyelitis can not be excluded. Differential includes osteopenia or metabolic disorders. Further evaluation with MRI examination would be helpful. Electronically Signed   By: Larose Hires D.O.   On: 08/08/2021 23:00  ? ?DG Foot 2 Views Right ? ?Result Date: 08/08/2021 ?CLINICAL DATA:  Bilateral lower extremity leg wounds. EXAM: RIGHT FOOT - 2 VIEW  COMPARISON:  None. FINDINGS: There is no evidence of fracture or dislocation. There is osseous erosion or severe osteopenia of the tibial aspect of the fifth metatarsal head. Marked skin thickening and subcutaneous soft tiss

## 2021-08-10 NOTE — Progress Notes (Signed)
?  Echocardiogram ?2D Echocardiogram has been performed. ? ?[name] [name] ?08/10/2021, 2:33 PM ?

## 2021-08-10 NOTE — Progress Notes (Addendum)
? ? ? ? ? ? ?Subjective: ? ?Patient is fairly obtunded in ICU ? ? ?Antibiotics:  ?Anti-infectives (From admission, onward)  ? ? Start     Dose/Rate Route Frequency Ordered Stop  ? 08/10/21 1100  Ampicillin-Sulbactam (UNASYN) 3 g in sodium chloride 0.9 % 100 mL IVPB       ? 3 g ?200 mL/hr over 30 Minutes Intravenous Every 6 hours 08/10/21 1002    ? 08/10/21 0200  ceFEPIme (MAXIPIME) 2 g in sodium chloride 0.9 % 100 mL IVPB  Status:  Discontinued       ? 2 g ?200 mL/hr over 30 Minutes Intravenous Every 24 hours 08/09/21 0015 08/10/21 1002  ? 08/09/21 2000  DAPTOmycin (CUBICIN) 600 mg in sodium chloride 0.9 % IVPB  Status:  Discontinued       ? 8 mg/kg ? 74.8 kg ?124 mL/hr over 30 Minutes Intravenous Daily 08/09/21 1459 08/10/21 1002  ? 08/09/21 0015  vancomycin variable dose per unstable renal function (pharmacist dosing)  Status:  Discontinued       ?  Does not apply See admin instructions 08/09/21 0015 08/09/21 1459  ? 08/08/21 2330  ceFEPIme (MAXIPIME) 2 g in sodium chloride 0.9 % 100 mL IVPB       ? 2 g ?200 mL/hr over 30 Minutes Intravenous  Once 08/08/21 2328 08/09/21 0453  ? 08/08/21 2200  vancomycin (VANCOREADY) IVPB 1500 mg/300 mL       ? 1,500 mg ?150 mL/hr over 120 Minutes Intravenous  Once 08/08/21 2150 08/09/21 0226  ? 08/08/21 2145  piperacillin-tazobactam (ZOSYN) IVPB 3.375 g  Status:  Discontinued       ? 3.375 g ?100 mL/hr over 30 Minutes Intravenous  Once 08/08/21 2140 08/08/21 2324  ? ?  ? ? ?Medications: ?Scheduled Meds: ? apixaban  2.5 mg Oral BID  ? Chlorhexidine Gluconate Cloth  6 each Topical Daily  ? leptospermum manuka honey  1 application. Topical Daily  ? sodium chloride flush  3 mL Intravenous Q12H  ? ?Continuous Infusions: ? sodium chloride 100 mL/hr at 08/10/21 1050  ? ampicillin-sulbactam (UNASYN) IV 200 mL/hr at 08/10/21 1050  ? ?PRN Meds:.acetaminophen **OR** acetaminophen, LORazepam, morphine injection, polyethylene glycol ? ? ? ?Objective: ?Weight change: -7.144 kg ? ?Intake/Output  Summary (Last 24 hours) at 08/10/2021 1143 ?Last data filed at 08/10/2021 1050 ?Gross per 24 hour  ?Intake 2100.45 ml  ?Output 1850 ml  ?Net 250.45 ml  ? ?Blood pressure (!) 114/43, pulse 87, temperature (!) 97.5 ?F (36.4 ?C), temperature source Skin, resp. rate 20, height 5\' 11"  (1.803 m), weight 67.7 kg, SpO2 97 %. ?Temp:  [97.5 ?F (36.4 ?C)-98.5 ?F (36.9 ?C)] 97.5 ?F (36.4 ?C) (04/12 0825) ?Pulse Rate:  [67-110] 87 (04/12 1100) ?Resp:  [14-21] 20 (04/12 1100) ?BP: (96-116)/(37-65) 114/43 (04/12 1100) ?SpO2:  [97 %-100 %] 97 % (04/12 1100) ?Weight:  [67.7 kg] 67.7 kg (04/12 0212) ? ?Physical Exam: ?Physical Exam ?HENT:  ?   Nose: Nose normal.  ?Cardiovascular:  ?   Rate and Rhythm: Tachycardia present. Rhythm irregular.  ?Pulmonary:  ?   Effort: Pulmonary effort is normal. No respiratory distress.  ?   Breath sounds: No wheezing.  ?Abdominal:  ?   General: There is no distension.  ?Neurological:  ?   General: No focal deficit present.  ?   Mental Status: He is alert.  ?Psychiatric:     ?   Attention and Perception: He is inattentive.     ?  Speech: Speech is delayed.     ?   Behavior: Behavior is cooperative.     ?   Cognition and Memory: Cognition is impaired. Memory is impaired. He exhibits impaired recent memory and impaired remote memory.  ?  ? ?CBC: ? ? ? ?BMET ?Recent Labs  ?  08/10/21 ?0616 08/10/21 ?0806  ?NA 132* 133*  ?K 3.6 3.6  ?CL 109 110  ?CO2 15* 15*  ?GLUCOSE 75 78  ?BUN 63* 57*  ?CREATININE 1.41* 1.38*  ?CALCIUM 8.3* 8.2*  ? ? ? ?Liver Panel ? ?Recent Labs  ?  08/08/21 ?2100 08/09/21 ?2035 08/10/21 ?5974 08/10/21 ?1638  ?PROT 7.7  --   --   --   ?ALBUMIN 3.2*   < > 2.3* 2.2*  ?AST 31  --   --   --   ?ALT 20  --   --   --   ?ALKPHOS 98  --   --   --   ?BILITOT 0.6  --   --   --   ? < > = values in this interval not displayed.  ? ? ? ? ? ?Sedimentation Rate ?Recent Labs  ?  08/09/21 ?0930  ?ESRSEDRATE 13  ? ?C-Reactive Protein ?Recent Labs  ?  08/09/21 ?0020 08/09/21 ?2051  ?CRP 1.9* 7.3*  ? ? ?Micro  Results: ?Recent Results (from the past 720 hour(s))  ?Blood culture (routine x 2)     Status: None (Preliminary result)  ? Collection Time: 08/08/21  9:00 PM  ? Specimen: BLOOD  ?Result Value Ref Range Status  ? Specimen Description   Final  ?  BLOOD BLOOD LEFT FOREARM ?Performed at [facility], [address], [place], [place] [address] ?  ? Special Requests   Final  ?  BOTTLES DRAWN AEROBIC AND ANAEROBIC Blood Culture adequate volume ?Performed at [facility], [address] [place], [place] [address] ?  ? Culture   Final  ?  NO GROWTH 2 DAYS ?Performed at [facility], [address], [place], [place] [address] ?  ? Report Status PENDING  Incomplete  ?Blood culture (routine x 2)     Status: None (Preliminary result)  ? Collection Time: 08/08/21  9:00 PM  ? Specimen: BLOOD  ?Result Value Ref Range Status  ? Specimen Description   Final  ?  BLOOD BLOOD RIGHT HAND ?Performed at [facility], [address], [place], [place] [address] ?  ? Special Requests   Final  ?  BOTTLES DRAWN AEROBIC AND ANAEROBIC Blood Culture adequate volume ?Performed at [facility], [address], [place], [place] [address] ?  ? Culture   Final  ?  NO GROWTH 2 DAYS ?Performed at [facility], [address], [place], [place] [address] ?  ? Report Status PENDING  Incomplete  ?Resp Panel by RT-PCR (Flu A&B, Covid) Nasopharyngeal Swab     Status: None  ? Collection Time: 08/09/21 12:19 AM  ? Specimen: Nasopharyngeal Swab; Nasopharyngeal(NP) swabs in vial transport medium  ?Result Value Ref Range Status  ? SARS Coronavirus 2 by RT PCR NEGATIVE NEGATIVE Final  ?  Comment: (NOTE) ?SARS-CoV-2 target nucleic acids are NOT DETECTED. ? ?The SARS-CoV-2 RNA is generally detectable in upper respiratory ?specimens during the acute phase of infection. The lowest ?concentration of SARS-CoV-2 viral copies this assay can detect is ?138 copies/mL. A negative result  does not preclude SARS-Cov-2 ?infection and should not be used  as the sole basis for treatment or ?other patient management decisions. A negative result may occur with  ?improper specimen collection/handling, submission of specimen other ?than nasopharyngeal swab, presence of viral mutation(s) within the ?areas targeted by this assay, and inadequate number of viral ?copies(<138 copies/mL). A negative result must be combined with ?clinical observations, patient history, and epidemiological ?information. The expected result is Negative. ? ?Fact Sheet for Patients:  ?BloggerCourse.com ? ?Fact Sheet for Healthcare Providers:  ?SeriousBroker.it ? ?This test is no t yet approved or cleared by the [place] FDA and  ?has been authorized for detection and/or diagnosis of SARS-CoV-2 by ?FDA under an Emergency Use Authorization (EUA). This EUA will remain  ?in effect (meaning this test can be used) for the duration of the ?COVID-19 declaration under Section 564(b)(1) of the Act, 21 ?U.S.C.section 360bbb-3(b)(1), unless the authorization is terminated  ?or revoked sooner.  ? ? ?  ? Influenza A by PCR NEGATIVE NEGATIVE Final  ? Influenza B by PCR NEGATIVE NEGATIVE Final  ?  Comment: (NOTE) ?The Xpert Xpress SARS-CoV-2/FLU/RSV plus assay is intended as an aid ?in the diagnosis of influenza from Nasopharyngeal swab specimens and ?should not be used as a sole basis for treatment. Nasal washings and ?aspirates are unacceptable for Xpert Xpress SARS-CoV-2/FLU/RSV ?testing. ? ?Fact Sheet for Patients: ?BloggerCourse.com ? ?Fact Sheet for Healthcare Providers: ?SeriousBroker.it ? ?This test is not yet approved or cleared by the [place] FDA and ?has been authorized for detection and/or diagnosis of SARS-CoV-2 by ?FDA under an Emergency Use Authorization (EUA). This EUA will remain ?in effect (meaning this test can be used) for  the duration of the ?COVID-19 declaration under Section 564(b)(1) of the Act, 21 U.S.C. ?section 360bbb-3(b)(1), unless the authorization is terminated or ?revoked. ? ?Performed at [facility]

## 2021-08-10 NOTE — Evaluation (Signed)
Clinical/Bedside Swallow Evaluation ?Patient Details  ?Name: [name] [name] ?MRN: [id number] ?Date of Birth: 1940/11/13 ? ?Today's Date: 08/10/2021 ?Time: SLP Start Time (ACUTE ONLY): 1520 SLP Stop Time (ACUTE ONLY): 1558 ?SLP Time Calculation (min) (ACUTE ONLY): 38 min ? ?Past Medical History:  ?Past Medical History:  ?Diagnosis Date  ? CAD (coronary artery disease)   ? LHC 11/06: pLAD 90, pD1 30, oD2 25 with inf branch 60; septal perf ostial 95; prox MOM 25, RCA 25, oPL2 95, EF 65 >> PCI:  BMS to proximal LAD   ? FH: CAD (coronary artery disease)   ? History of echocardiogram   ? Echo 3/17:  Mod LVH, EF 55-60, no RWMA, mild AI, mild to mod RAE  ? History of tobacco abuse   ? Hyperlipidemia   ? Persistent atrial fibrillation ([place])   ? Xarelto for a/c  ? ?Past Surgical History:  ?Past Surgical History:  ?Procedure Laterality Date  ? CARDIAC CATHETERIZATION    ? CORONARY STENT PLACEMENT    ? stents of the proximal lad with bare metal stent  ? PERCUTANEOUS CORONARY STENT INTERVENTION (PCI-S)    ? ?HPI:  ?Per MD notes "Patient presenting after being found to be doing generally unwell with worsening wounds during a wellness check, poor po, skipping meals, leg wounds" ? Cellulitis, worsening of chronic venous stasis wounds in the setting of no longer seeing wound care, Possible cellulitic superinfection in wounds, Afib with elevated in ED *110s to 120s, , fall from truck - hit found in 10/2019, prior smoker - stopped in the 80s, Mild ethmoid sinus mucosal thickening, negative brain imaging 10/2019, Neuropsychiatric problems including potentially scratching the wounds, neglect per ID, neurology consulted; Per ID MD "I suspect he either has dementia that is declared itself recently or he could have had an psychotic break in the context of his severe depression and his "double life having been revealed." lives in unsanitary warehouse,  Hoarding items, Almyra Free poses as wife, -calls him to remind him to take medications, ? Double life,  rec eventual palliative  ?  ?Assessment / Plan / Recommendation  ?Clinical Impression ? Pt presnts with functional oropharyngeal swallow ability - although he did not pass a 3 ounce Yale water scrren with 2 attempts.  He was able to feed himself entire snack of   water, juice, applesauce and graham crackers. No oral pocketing or indication of dysphagia with all po.  Recommend regular/thin diet with general precautions. RN reports pt consumed his pills without any issues. Will follow up next date for cog linguistic evaluation as ordered and once for swallowing to assure pt managing well given failure of Yale x2 -once with immediate cough, 2nd time delayed cough.  Pt informed and agreeable to plan. ?SLP Visit Diagnosis: Dysphagia, unspecified (R13.10) ?   ?Aspiration Risk ? Mild aspiration risk  ?  ?Diet Recommendation Thin liquid;Regular  ? ?Liquid Administration via: Cup;Straw ?Medication Administration: Whole meds with liquid ?Supervision: Patient able to self feed ?Compensations: Minimize environmental distractions  ?  ?Other  Recommendations Oral Care Recommendations: Oral care BID   ? ?Recommendations for follow up therapy are one component of a multi-disciplinary discharge planning process, led by the attending physician.  Recommendations may be updated based on patient status, additional functional criteria and insurance authorization. ? ?Follow up Recommendations    ? ? ?  ?Assistance Recommended at Discharge  TBD  ?Functional Status Assessment    ?Frequency and Duration min 1 x/week  ?1 week ?  ?   ? ?  Prognosis Prognosis for Safe Diet Advancement: Good  ? ?  ? ?Swallow Study   ?General Date of Onset: 08/10/21 ?HPI: Per MD notes "Patient presenting after being found to be doing generally unwell with worsening wounds during a wellness check, poor po, skipping meals, leg wounds" ? Cellulitis, worsening of chronic venous stasis wounds in the setting of no longer seeing wound care, Possible cellulitic  superinfection in wounds, Afib with elevated in ED *110s to 120s, , fall from truck - hit found in 10/2019, prior smoker - stopped in the 80s, Mild ethmoid sinus mucosal thickening, negative brain imaging 10/2019, Neuropsychiatric problems including potentially scratching the wounds, neglect per ID, neurology consulted; Per ID MD "I suspect he either has dementia that is declared itself recently or he could have had an psychotic break in the context of his severe depression and his "double life having been revealed." lives in unsanitary warehouse,  Hoarding items, Almyra Free poses as wife, -calls him to remind him to take medications, ? Double life, rec eventual palliative ?Type of Study: Bedside Swallow Evaluation ?Diet Prior to this Study: Regular;Thin liquids ?Temperature Spikes Noted: No ?Respiratory Status: Room air ?History of Recent Intubation: No ?Behavior/Cognition: Alert;Cooperative;Pleasant mood ?Oral Cavity Assessment: Within Functional Limits ?Oral Care Completed by SLP: No ?Oral Cavity - Dentition: Adequate natural dentition;Poor condition ?Vision: Functional for self-feeding ?Self-Feeding Abilities: Able to feed self ?Patient Positioning: Upright in bed ?Baseline Vocal Quality: Normal ?Volitional Cough: Strong ?Volitional Swallow: Able to elicit  ?  ?Oral/Motor/Sensory Function Overall Oral Motor/Sensory Function: Other (comment) (slight right facial asymmetry, decreased movement with volitional small, symmetrial with reflexive smile)   ?Ice Chips Ice chips: Within functional limits   ?Thin Liquid Thin Liquid: Impaired ?Presentation: Self Fed;Cup;Straw;Spoon ?Pharyngeal  Phase Impairments: Cough - Delayed ?Other Comments: cough x2 boluses with Yale water screen; small boluses tolerated without any indication of aspiration and pt denies pna hx  ?  ?Nectar Thick Nectar Thick Liquid: Not tested   ?Honey Thick Honey Thick Liquid: Not tested   ?Puree Puree: Not tested   ?Solid ? ? ?  Solid: Not tested  ? ?   ? ?[name] [name] ?08/10/2021,5:27 PM ? ?Kathleen Lime, MS CCC SLP ?Acute Rehab Services ?Office [phone] ?Pager [phone] ? ? ?

## 2021-08-10 NOTE — TOC Initial Note (Signed)
Transition of Care (TOC) - Initial/Assessment Note  ? ? ?Patient Details  ?Name: [name] [name] ?MRN: [id number] ?Date of Birth: August 03, 1940 ? ?Transition of Care (TOC) CM/SW Contact:    ?Cecille Po, RN ?Phone Number: ?08/10/2021, 2:09 PM ? ?Clinical Narrative:                 ? 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  Used to go to outpatient wound clinic, but stopped within the last year or so.  ?Complicated social history (see note from ID on 4/11), but family cooperating with each other.   ?TOC following.  ? ? ?Expected Discharge Plan: Skilled Nursing Facility ?Barriers to Discharge: Continued Medical Work up ? ? ?Expected Discharge Plan and Services ?Expected Discharge Plan: Skilled Nursing Facility ?  ?   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                ?  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       ?Need for Family Participation in Patient Care: Yes (Comment) ?Care giver support system in place?: Yes (comment) ?  ?Criminal Activity/Legal Involvement Pertinent to Current Situation/Hospitalization: No - Comment as needed ? ?Activities of Daily Living ?Home Assistive Devices/Equipment: Crutches, Eyeglasses, Dentures (specify type) (upper partial plate) ?ADL Screening (condition at time of admission) ?Patient's cognitive ability adequate to safely complete daily activities?: Yes ?Is the patient deaf or have difficulty hearing?: Yes ?Does the patient have difficulty seeing, even when wearing glasses/contacts?: No ?Does the patient have difficulty concentrating, remembering, or making decisions?: Yes ?Patient able to express need for assistance with ADLs?: Yes ?Does the patient have difficulty dressing or  bathing?: Yes (secondary to weakness and left lower extremity wound) ?Independently performs ADLs?: No (secondary to weakness and left lower extremity wound) ?Communication: Independent ?Dressing (OT): Needs assistance ?Is this a change from baseline?: Change from baseline, expected to last >3 days ?Grooming: Needs assistance ?Is this a change from baseline?: Change from baseline, expected to last >3 days ?Feeding: Needs assistance ?Is this a change from baseline?: Change from baseline, expected to last >3 days ?Bathing: Needs assistance ?Is this a change from baseline?: Change from baseline, expected to last >3 days ?Toileting: Needs assistance ?Is this a change from baseline?: Change from baseline, expected to last >3days ?In/Out Bed: Needs assistance ?Is this a change from baseline?: Change from baseline, expected to last >3 days ?Walks in Home: Needs assistance ?Is this a change from baseline?: Change from baseline, expected to last >3 days ?Does the patient have difficulty walking or climbing stairs?: Yes (secondary to weakness and left lower extremity wound) ?Weakness of Legs: Both (L>R) ?Weakness of Arms/Hands: None ? ?Emotional Assessment ? Orientation: : Oriented to Self, Oriented to Place ?Alcohol / Substance Use: Not Applicable ?Psych Involvement: No (comment) ? ?Admission diagnosis:  Hyperkalemia [E87.5] ?Hyponatremia [E87.1] ?Renal failure [N19] ?Acute renal failure (HCC) [N17.9] ?Acute renal failure, unspecified acute renal failure type (HCC) [N17.9] ?Cellulitis, unspecified cellulitis site [L03.90] ?Patient Active Problem List  ? Diagnosis Date Noted  ? Renal failure 08/09/2021  ? Cellulitis   ? Sepsis with acute organ dysfunction without septic shock (HCC)   ? Moderate dementia with psychotic disturbance (HCC)   ?  Acute renal failure (HCC) 08/08/2021  ? Hyperkalemia 08/08/2021  ? Hypermagnesemia 08/08/2021  ? Uremia 08/08/2021  ? Hyponatremia 08/08/2021  ? Coronary artery disease involving native  coronary artery of native heart without angina pectoris 09/04/2016  ? Persistent atrial fibrillation (HCC) 09/04/2016  ? Hyperlipidemia   ? ?PCP:  Doristine Bosworth, MD ?Pharmacy:   ?[facility] [phone] - [place], [place] - [address] AT [facility] [address] ?[address] ?[place] [address] ?Phone: [phone] Fax: [phone] ? ?

## 2021-08-10 NOTE — Progress Notes (Signed)
?Progress Note ? ? ?Patient: [name] [name] IOX:[id number] DOB: 06-26-1940 DOA: 08/08/2021     1 ?DOS: the patient was seen and examined on 08/10/2021 ?  ?Brief hospital course: ?81 y.o. male with medical history significant of atrial fibrillation, CAD, hyperlipidemia, venous stasis, retinopathy presenting with general decline and worsening leg wound. ? ?Some history obtained with assistance of chart review.  Friends were previously here providing history but have gone home.  Patient has known history of chronic lower extremity wounds from venous stasis.  Had previously been seeing wound care clinic but it stopped taking care of himself and had not been going to the clinic.  He has been living alone for some time following a recent divorce.  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  His previous ex-wife has been checking on him by phone but had not seen him in some time.  Patient was checked on for well check today and found to have general decline and significant worsening of his leg wounds. ? ?4/11- remains confused, lower extremity wounds evaluated. ID consulted for assistance given FTT and severity of infection. Renal consulted given impending renal failure, ARF seems to be more pre-renal FTT than sepsis. ? ?4/12 seen at bedside, awaiting MRI. Antibiotics on board. He is slightly improving still confused, but not delirious. Mittens in place. Kidney function improving slowly. Making good urine output. Leg wounds requested evaluation with wound care team.  ? ?Assessment and Plan: ?No notes have been filed under this hospital service. ?Service: Hospitalist ? ?Sepsis ?- hypotension, AMS, renal failure, lower extremity wounds ?- will continue empiric Antibiotics ?Vanc 4/10-4/11 ?Daptomycin 4/11- ?Zosyn 4/10- ?Blood cultures pending, NGTD ?- appreciate ID assistance ?- MRI lower extremities pending, hopefully we can obtain today ?- continue antibiotics. Consider  vascular consult for venous stasis wounds may need need profore ulcer dressing, will check with vascular team ? ?Acute renal failure ?Hyperkalemia ?Hyponatremia ?Hypomagnesemia ?Uremia, ?Improving, ?> Patient presenting after being found to be doing generally unwell with worsening wounds during a wellness check. ?> Given significant elevated BUN to creatinine ratio suspect this is prerenal etiology from decreased p.o. intake. ?> Creatinine elevated to 2.74 from baseline of 1, BUN 117, sodium 126, potassium 6.8.  Magnesium 3.2. ?> Questionable mildly peaked T waves and mild prolongation of QRS at 115 ms.  Nonspecific ST changes.  T waves look similar to previous but QRS is slightly longer than previous which was 96. ?> Patient received a liter of fluids in the ED, albuterol, calcium gluconate, dextrose and insulin. ?- Requested Nephrology input given possible impending decline in renal function. Certainly not the best CRRT candidate but will d/w nephrology ?- continue [facility] as well, not a candidate for loop diuretic given significant AKI ?- - Renal function panel every 3 hours to trend sodium and potassium ?- Goal 24-hour sodium of 132 or less ?  ?Venous stasis ?Leg wounds ?> Patient presenting with worsening of his chronic venous stasis wounds in the setting of no longer seeing wound care apparently overall taking care of himself. ?> Possible cellulitic superinfection of his wounds started on vancomycin in the ED ?> As cefepime in the place of previously ordered Zosyn due to AKI ?> No leukocytosis currently but will check ESR and CRP. ?> X-ray indicates possible osteomyelitis though changes could be consistent with osteopenia as well ?- Monitor on progressive unit as above ?- ESR pending and CRP slightly elev, at 1.9 ?-  Continue antibiotics with Dapto and cefepime for now ?- Trend fever curve and white count ?- WOC consult ?- Consider MRI,  ?  ?Atrial Fibrillation ?> Rate in the ED in the 110s to low 120s range.   Not on any rate control outpatient due to not tolerating beta-blockers or calcium channel blockers. ?> Has reportedly not been taking her medications we will reinitiate Eliquis but at 2.5 mg twice daily due to his AKI and age. ?- Continue Eliquis to 2.5 mg twice daily for now ?- If rate increased significantly to RVR with consider amiodarone given patient's history of intolerance of p.o. beta-blockers and calcium channel blockers per recent cardiology note. ?  ? ?AMS ?Uremia? Sepsis?  ?No evidence of CVA ?MRI head pending ??dementia, would fall in line with FTT picture ?Severe depression given history of "double life" as identified by Infectious disease specialist.  ? ?DVT prophylaxis:      Eliquis ?Code Status:                         Full ?Family Communication:      None on admission.  Friend/family was here earlier and aware of his admission but had gone home. ?Disposition Plan:  ?            Patient is from:                        Home ?            Anticipated DC to:                   Home ?            Anticipated DC date:               1 to 5 days ?            Anticipated DC barriers:         None    ?Consults called:       None ?Admission status:    Observation, progressive ?  ?Severity of Illness: ?The appropriate patient status for this patient is inpatient stay ? ?  ?How to contact the [facility] Attending or Consulting provider [place] or covering provider during after hours [place], for this patient?  ?  ?Check the care team in [facility] and look for a) attending/consulting [facility] provider listed and b) the [facility] team listed ?Log into [email or web address] and use [place]'s universal password to access. If you do not have the password, please contact the hospital operator. ?Locate the [facility] provider you are looking for under [facility] and page to a number that you can be directly reached. ?If you still have difficulty reaching the provider, please page the [facility] (Director on Call) for the Hospitalists listed on amion for  assistance. ?  ?08/08/2021, 11:42 PM  ?  ? ? ?  ? ?Subjective: Seen on AM rounds, still slightly altered from baseline according to family. ?Denies pain. In discussion with family, there is acute change within the last week but definite concern that patient does not take care of himself, skipping meals. No obvious source of infection per family visits, but these are scarce.  ? ?Physical Exam: ?Vitals:  ? 08/10/21 0900 08/10/21 1000 08/10/21 1100 08/10/21 1143  ?BP: (!) 105/51 (!) 96/43 (!) 114/43   ?Pulse: (!) 109 100 87   ?Resp: (!) _0 ?  Temp:    (!) 97.5 ?F (36.4 ?C)  ?TempSrc:    Skin  ?SpO2: 100% 100% 97%   ?Weight:      ?Height:      ? Constitutional:   ?   General: He is not in acute distress. ?   Appearance: Normal appearance.  ?HENT:  ?   Head: Normocephalic and atraumatic.  ?   Mouth/Throat:  ?   Mouth: Mucous membranes are moist.  ?   Pharynx: Oropharynx is clear.  ?Eyes:  ?   Extraocular Movements: Extraocular movements intact.  ?   Pupils: Pupils are equal, round, and reactive to light.  ?Cardiovascular:  ?   Rate and Rhythm: Normal rate and regular rhythm.  ?   Pulses: Normal pulses.  ?   Heart sounds: Normal heart sounds.  ?Pulmonary:  ?   Effort: Pulmonary effort is normal. No respiratory distress.  ?   Breath sounds: Normal breath sounds.  ?Abdominal:  ?   General: Bowel sounds are normal. There is no distension.  ?   Palpations: Abdomen is soft.  ?   Tenderness: There is no abdominal tenderness.  ?Musculoskeletal:     ?   General: Deformity present.  ?   Right lower leg: Edema present.  ?   Left lower leg: Edema present.  ?   Comments: Significant bilateral lower extremity wounds, history of venous stasis, possible cellulitic superinfection.  ?Skin: ?   General: Skin is warm and dry.  ?Neurological:  ?   General: No focal deficit present.  ?   Mental Status: Mental status is at baseline.  ? ?Family Communication: bedside ? ?Disposition: ?Status is: Inpatient ?Remains inpatient appropriate  because: AMS, infection, lower extrem wounds ? Planned Discharge Destination: SNF ? ? ? ?Time spent: 40 minutes ? ?Author: ?Vanna Scotland, MD ?08/10/2021 1:06 PM ? ?For on call review [email or web address].  ?

## 2021-08-10 NOTE — Evaluation (Signed)
SLP Cancellation Note ? ?Patient Details ?Name: [name] [name] ?MRN: [id number] ?DOB: [birth year] ? ? ?Cancelled treatment:       Reason Eval/Treat Not Completed: Other (comment) (per ID noets, pt largely obtuned, will continue efforts) ? ?[name] Infante, MS CCC SLP ?Acute Rehab Services ?Office [phone] ?Pager [phone] ? ? ?[name] [name] ?08/10/2021, 12:37 PM ? ? ? ?

## 2021-08-11 DIAGNOSIS — F4321 Adjustment disorder with depressed mood: Secondary | ICD-10-CM

## 2021-08-11 LAB — RENAL FUNCTION PANEL
Albumin: 2.3 g/dL — ABNORMAL LOW (ref 3.5–5.0)
Anion gap: 6 (ref 5–15)
BUN: 49 mg/dL — ABNORMAL HIGH (ref 8–23)
CO2: 18 mmol/L — ABNORMAL LOW (ref 22–32)
Calcium: 8.5 mg/dL — ABNORMAL LOW (ref 8.9–10.3)
Chloride: 112 mmol/L — ABNORMAL HIGH (ref 98–111)
Creatinine, Ser: 1.31 mg/dL — ABNORMAL HIGH (ref 0.61–1.24)
GFR, Estimated: 55 mL/min — ABNORMAL LOW (ref >60)
Glucose, Bld: 101 mg/dL — ABNORMAL HIGH (ref 70–99)
Phosphorus: 2.5 mg/dL (ref 2.5–4.6)
Potassium: 3.5 mmol/L (ref 3.5–5.1)
Sodium: 136 mmol/L (ref 135–145)

## 2021-08-11 LAB — ANCA PROFILE
Anti-MPO Antibodies: <0.2 units (ref 0.0–0.9)
Anti-PR3 Antibodies: <0.2 units (ref 0.0–0.9)
Atypical P-ANCA titer: <1:20 {titer}
C-ANCA: <1:20 {titer}
P-ANCA: <1:20 {titer}

## 2021-08-11 LAB — SJOGRENS SYNDROME-A EXTRACTABLE NUCLEAR ANTIBODY: SSA (Ro) (ENA) Antibody, IgG: <0.2 AI (ref 0.0–0.9)

## 2021-08-11 LAB — SJOGRENS SYNDROME-B EXTRACTABLE NUCLEAR ANTIBODY: SSB (La) (ENA) Antibody, IgG: <0.2 AI (ref 0.0–0.9)

## 2021-08-11 LAB — RHEUMATOID FACTOR: Rheumatoid fact SerPl-aCnc: <10 IU/mL (ref <14.0)

## 2021-08-11 NOTE — Progress Notes (Signed)
Speech Language Pathology Treatment: Dysphagia  ?Patient Details ?Name: [name] [name] ?MRN: [id number] ?DOB: February 09, 1941 ?Today's Date: 08/11/2021 ?Time: 8315-1761 ?SLP Time Calculation (min) (ACUTE ONLY): 12 min ? ?Assessment / Plan / Recommendation ?Clinical Impression ? Skilled SLP follow up for dysphagia management/goals due to pt failing [facility] swallow screen x2 yesterday. RN reports pt not having coughing with intake.  No focal cn deficits noted and pt consumed his lunch *chicken salad, cookies, coffee* without oral pocketing, s/s of aspiration.  He reports swallow to be consistent with baseline.  SLP ordered extra meal due to small salad and pt's intake adequate and ordered dinner for pt.  No follow up regarding swallow needed.   Informed pt to swallow precautions. ? ?  ?HPI HPI: Per MD notes "Patient presenting after being found to be doing generally unwell with worsening wounds during a wellness check, poor po, skipping meals, leg wounds" ? Cellulitis, worsening of chronic venous stasis wounds in the setting of no longer seeing wound care, Possible cellulitic superinfection in wounds, Afib with elevated in ED *110s to 120s, , fall from truck - hit found in 10/2019, prior smoker - stopped in the 80s, Mild ethmoid sinus mucosal thickening, negative brain imaging 10/2019, Neuropsychiatric problems including potentially scratching the wounds, neglect per ID, neurology consulted; Per ID MD "I suspect he either has dementia that is declared itself recently or he could have had an psychotic break in the context of his severe depression and his "double life having been revealed." 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 Double life, rec eventual palliative ?  ?   ?SLP Plan ?  No f/u for swallow, Cog eval pending ? ? ?  ?  ?Recommendations for follow up therapy are one component of a multi-disciplinary discharge planning process, led by the attending  physician.  Recommendations may be updated based on patient status, additional functional criteria and insurance authorization. ?  ? ?Recommendations  ?Diet recommendations: Regular;Thin liquid ?Liquids provided via: Cup ?Medication Administration: Whole meds with liquid ?Supervision: Patient able to self feed ?Compensations: Minimize environmental distractions ?Postural Changes and/or Swallow Maneuvers: Seated upright 90 degrees  ?   ?    ?   ? ? ? ? Oral Care Recommendations: Oral care BID ?SLP Visit Diagnosis: Dysphagia, unspecified (R13.10) ? ? ? ? ?  ?  ?Rolena Infante, MS CCC SLP ?Acute Rehab Services ?Office [phone] ?Pager [phone] ? ? ?Chales Abrahams ? ?08/11/2021, 12:43 PM ?

## 2021-08-11 NOTE — Progress Notes (Signed)
?Progress Note ? ? ?Patient: [name] [name] GBM:[id number] DOB: December 08, 1940 DOA: 08/08/2021     2 ?DOS: the patient was seen and examined on 08/11/2021 ?  ?Brief hospital course: ?81 y.o. male with medical history significant of atrial fibrillation, CAD, hyperlipidemia, venous stasis, retinopathy presenting with general decline and worsening leg wound. ? ?Some history obtained with assistance of chart review.  Friends were previously here providing history but have gone home.  Patient has known history of chronic lower extremity wounds from venous stasis.  Had previously been seeing wound care clinic but it stopped taking care of himself and had not been going to the clinic.  He has been living alone for some time following a recent divorce.  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  His previous ex-wife has been checking on him by phone but had not seen him in some time.  Patient was checked on for well check today and found to have general decline and significant worsening of his leg wounds. ? ?4/11- remains confused, lower extremity wounds evaluated. ID consulted for assistance given FTT and severity of infection. Renal consulted given impending renal failure, ARF seems to be more pre-renal FTT than sepsis. ? ?4/12 seen at bedside, awaiting MRI. Antibiotics on board. He is slightly improving still confused, but not delirious. Mittens in place. Kidney function improving slowly. Making good urine output. Leg wounds requested evaluation with wound care team.  ? ?Assessment and Plan: ?No notes have been filed under this hospital service. ?Service: Hospitalist ? ?Sepsis ?- hypotension, AMS, renal failure, lower extremity wounds ?- will continue empiric Antibiotics ?Vanc 4/10-4/11 ?Daptomycin 4/11-4/12 ?Zosyn 4/10-4/12 ?Transition to Unasyn 4/12-  ?Blood cultures pending, NGTD ?- appreciate ID assistance ?- MRI lower extremities pending, hopefully we can obtain  today ?- continue antibiotics. Consider vascular consult for venous stasis wounds may need need profore ulcer dressing, will check with vascular team ? ?Acute renal failure ?Hyperkalemia ?Hyponatremia ?Hypomagnesemia ?Uremia, ?Improving, ?> Patient presenting after being found to be doing generally unwell with worsening wounds during a wellness check. ?> Given significant elevated BUN to creatinine ratio suspect this is prerenal etiology from decreased p.o. intake. ?> Creatinine elevated to 2.74 from baseline of 1, BUN 117, sodium 126, potassium 6.8.  Magnesium 3.2. ?> Questionable mildly peaked T waves and mild prolongation of QRS at 115 ms.  Nonspecific ST changes.  T waves look similar to previous but QRS is slightly longer than previous which was 96. ?> Patient received a liter of fluids in the ED, albuterol, calcium gluconate, dextrose and insulin. ?- Requested Nephrology input given possible impending decline in renal function. Certainly not the best CRRT candidate but will d/w nephrology ?- continue [facility] as well, not a candidate for loop diuretic given significant AKI ?- - Renal function panel every 3 hours to trend sodium and potassium ?- Goal 24-hour sodium of 132 or less ?  ?Venous stasis ?Leg wounds ?> Patient presenting with worsening of his chronic venous stasis wounds in the setting of no longer seeing wound care apparently overall taking care of himself. ?> Possible cellulitic superinfection of his wounds started on vancomycin in the ED ?> As cefepime in the place of previously ordered Zosyn due to AKI ?> No leukocytosis currently but will check ESR and CRP. ?> X-ray indicates possible osteomyelitis though changes could be consistent with osteopenia as well ?- Monitor on progressive unit as above ?- ESR pending and CRP  slightly elev, at 1.9 ?- Continue antibiotics   ?- Trend fever curve and white count ?- WOC consult ?- MRI without evidence of osteo, but does show significant soft tissue infection.   ? Paged Vascular surgery for eval assistance and ?Profore tri compression dressing will be helpful, if underlying infection improved. Will d/w team ? ? ?Atrial Fibrillation ?> Rate in the ED in the 110s to low 120s range.  Not on any rate control outpatient due to not tolerating beta-blockers or calcium channel blockers. ?> Has reportedly not been taking her medications we will reinitiate Eliquis but at 2.5 mg twice daily due to his AKI and age. ?- Continue Eliquis to 2.5 mg twice daily for now ?- If rate increased significantly to RVR with consider amiodarone given patient's history of intolerance of p.o. beta-blockers and calcium channel blockers per recent cardiology note. ?  ? ?AMS ?Uremia? Sepsis?  ?No evidence of CVA ?MRI head pending ??dementia, would fall in line with FTT picture ?Severe depression given history of "double life" as identified by Infectious disease specialist.  ?Psych appreciating patient today ? ?DVT prophylaxis:      Eliquis ?Code Status:                         Full ?Family Communication:      None on admission.  Friend/family was here earlier and aware of his admission but had gone home. ?Disposition Plan:  ?            Patient is from:                        Home ?            Anticipated DC to:                   Home ?            Anticipated DC date:               1 to 5 days ?            Anticipated DC barriers:         None    ?Consults called:       None ?Admission status:    Observation, progressive ?  ?Severity of Illness: ?The appropriate patient status for this patient is inpatient stay ? ?  ?How to contact the [facility] Attending or Consulting provider [place] or covering provider during after hours [place], for this patient?  ?  ?Check the care team in [facility] and look for a) attending/consulting [facility] provider listed and b) the [facility] team listed ?Log into [email or web address] and use [place]'s universal password to access. If you do not have the password, please contact the hospital  operator. ?Locate the [facility] provider you are looking for under [facility] and page to a number that you can be directly reached. ?If you still have difficulty reaching the provider, please page the [facility] (Director on Call) for the Hospitalists listed on amion for assistance. ?  ?08/08/2021, 11:42 PM  ?  ? ? ?  ? ?Subjective: Seen on AM rounds, still slightly altered from baseline according to family. ?Denies pain. In discussion with family, there is acute change within the last week but definite concern that patient does not take care of himself, skipping meals. No obvious source of infection per family visits, but these are scarce.  ? ?  Physical Exam: ?Vitals:  ? 08/11/21 0800 08/11/21 1000 08/11/21 1100 08/11/21 1200  ?BP: (!) 118/42   (!) 99/51  ?Pulse: 85 97  88  ?Resp: 15 18  18   ?Temp:   (!) 97.5 ?F (36.4 ?C)   ?TempSrc:   Oral   ?SpO2: 100% 99%  100%  ?Weight:      ?Height:      ? Constitutional:   ?   General: He is not in acute distress. ?   Appearance: Normal appearance.  ?HENT:  ?   Head: Normocephalic and atraumatic.  ?   Mouth/Throat:  ?   Mouth: Mucous membranes are moist.  ?   Pharynx: Oropharynx is clear.  ?Eyes:  ?   Extraocular Movements: Extraocular movements intact.  ?   Pupils: Pupils are equal, round, and reactive to light.  ?Cardiovascular:  ?   Rate and Rhythm: Normal rate and regular rhythm.  ?   Pulses: Normal pulses.  ?   Heart sounds: Normal heart sounds.  ?Pulmonary:  ?   Effort: Pulmonary effort is normal. No respiratory distress.  ?   Breath sounds: Normal breath sounds.  ?Abdominal:  ?   General: Bowel sounds are normal. There is no distension.  ?   Palpations: Abdomen is soft.  ?   Tenderness: There is no abdominal tenderness.  ?Musculoskeletal:     ?   General: Deformity present.  ?   Right lower leg: Edema present.  ?   Left lower leg: Edema present.  ?   Comments: Significant bilateral lower extremity wounds, history of venous stasis, possible cellulitic superinfection.   ?Skin: ?   General: Skin is warm and dry.  ?Neurological:  ?   General: No focal deficit present.  ?   Mental Status: Mental status is at baseline.  ? ?Family Communication: bedside ? ?Disposition: ?Status is: Inpatient ?

## 2021-08-11 NOTE — Consult Note (Signed)
[facility] New Face-to-Face Psychiatric Evaluation ? ? ?Service Date: August 11, 2021 ?LOS:  LOS: 2 days  ? ? ?Assessment  ?[name] [name] is a 81 y.o. male admitted medically for 08/08/2021  7:43 PM for worsening wounds on ble. He carries the psychiatric diagnoses of no pertinent diagnoses and has a past medical history of  chronic venous stasis ulcers, hx tobacco use, afib, CAD, HLD. Psychiatry was consulted for major depressive disorder bipolar disorder, causing FTT? dual life by Dr. Alanda Amass.  ? ? ?His current presentation of significant cognitive decline upon presentation to the hospital that has improved daily is most consistent with resolving metabolic encephalopathy.  The patient would strongly benefit from medical treatment of infection, as well as further investigation for etiologies of delirium. During this time period, minimization of delirogenic insults will be of utmost importance; this includes promoting the normal circadian cycle, minimizing lines/tubes, avoiding deliriogenic medications such as benzodiazepines and anticholinergic medications, and frequently reorienting the patient. Symptomatic treatment for agitation can be provided by antipsychotic medications, though it is important to remember that these do not treat the underlying etiology of delirium. Notably, there can be a time lag effect between treatment of a medical problem and resolution of delirium. This time lag effect may be of longer duration in the elderly, and those with underlying cognitive impairment or brain injury. ? ?He and prior partner at bedside both report no psychiatric history over the course of his lifetime.  Has generally been more "stressed out" since dissolution of his marriage.  His depression screen was fairly equivocal, with many symptoms attributable to general decline in physical health status.  Overall he does not meet criteria for any psychiatric diagnosis aside from delirium/metabolic encephalopathy  as above, except potentially for an adjustment disorder.  It is worth noting that timeline of cognitive decline is much more acute than timeline of chronic psychosocial stressors mentioned in consult.  I believe that many symptoms of withdrawal noted earlier in hospitalization are related to metabolic encephalopathy; per family cognitive abilities appear to have been declining gradually over last couple of years with sharp decline about 2 weeks prior to hospitalization.  ? ? Psychiatry will be signing off although would recommend [facility] consult for individual and/or family therapy as an outpatient - inappropriate to start serotonergic medication in the setting of resolving metabolic encephalopathy as can often make this worse and patient has no current indication to do so. ? ?Diagnoses:  ?Active Hospital problems: ?Principal Problem: ?  Acute renal failure ([facility]) ?Active Problems: ?  Persistent atrial fibrillation ([place]) ?  Hyperkalemia ?  Hypermagnesemia ?  Uremia ?  Hyponatremia ?  Renal failure ?  ? ? ?Plan  ?## Safety and Observation Level:  ?- Based on my clinical evaluation, I estimate the patient to be at low risk of self harm in the current setting ?- At this time, we recommend a routine level of observation. This decision is based on my review of the chart including patient's history and current presentation, interview of the patient, mental status examination, and consideration of suicide risk including evaluating suicidal ideation, plan, intent, suicidal or self-harm behaviors, risk factors, and protective factors. This judgment is based on our ability to directly address suicide risk, implement suicide prevention strategies and develop a safety plan while the patient is in the clinical setting. Please contact our team if there is a concern that risk level has changed. ? ? ?## Medications:  ?-- none ? ? ?## Medical Decision  Making Capacity:  ?Not formally assessed.  ? ?## Further Work-up:  ?-- None ? ?SLUMS   performed by speech therapy reviewed. ? ?-- most recent EKG on 4/18 had QtC of 437 ? ?## Disposition:  ?-- Per primary likely SNF ? ?## Behavioral / Environmental:  ?-- Delirium precautions ordered ? ?##Legal Status ? ? ?Thank you for this consult request. Recommendations have been communicated to the primary team.  We will sign off  at this time.  ? ?Joycelyn Schmid A Yolinda Duerr ? ? ?New history  ?Relevant Aspects of Hospital Course:  ?Admitted on 08/08/2021 for chronic venous ulcers. ? ?Patient Report:  ?Patient seen in late afternoon.  He is generally withdrawn and allows his partner (identified elsewhere second wife) at bedside (who we consented to remain in room) to answer many historical questions.  Generally has trouble following conversation and more complicated questions and responds better to questions in yes/no format. ? ? ?Neither patient nor partner reports being informed of psychiatry consult.  Patient is alert and semioriented; briefly states year is 71 before self-correcting.  Knows it is spring, April, and the 13th.  Knows he is at the hospital in [place] and why he is in the hospital. ? ?He identifies that he was living on his own and is not sure over what period of time his leg wounds worsened.  Generally confused about timeline of last few weeks.  ? ? ?Reports "okay" mood.  endorses some symptoms of depression including decreased interest in prior activities (was previously very active, horseback riding, etc.), occasional feelings of guilt and worthlessness, poor energy.  Attempted to perform GDS given significant overlap between reported symptoms and medical complaints; this screen was equivocal with patient reporting some symptoms of boredom, helplessness (currently nearly total care) decreased energy, etc.  Overall given significant physical comorbidities depression screen negative.  Brief screens for anxiety, psychosis, bipolar disorder negative. ? ?Spoke separately to patient's partner with  patient's permission-she reports thickened concerns for cognitive decline over last year or so with acute decline about 2 weeks ago.  Has noticed improvement each day he has been in the hospital.  Daughter called partway through obtaining collateral; reports similar concerns.  Shares frustration and feelings of betrayal over course of relationship with patient; provided empathic listening and validation.  Discussed recommendation for outpatient therapy. ? ?ROS:  ?Cooperated in the HPI ? ?Collateral information:  ?Incorporated in the HPI ? ?Psychiatric History:  ?Information collected from patient ?Denies any prior psychiatric diagnosis.  Has never seen a psychiatrist.  Has never seen a therapist.  Has never been on psychotropic medication.  Has never been hospitalized for psychiatric reasons.  No periods of self-harm.  No periods of violence. ? ?Family psych history: 2 brothers both with Alzheimer's. ? ? ?Social History:  ?Was living in a warehouse of previous business by choice prior to admission.  Had been invited to live with partner and declined. ?Career as Art gallery manager.  Endorses spirituality.  Does not have access to weapons-had given guns away to a family member years ago and has since forgotten who he gave them to. ? ?Tobacco use: Remote, well in [employer] ?Alcohol use: Denies ?Drug use: Denies ? ?Family History:  ?The patient's family history includes Cancer - Colon in his mother; Heart attack in his father; Hypertension in his father. ? ?Medical History: ?Past Medical History:  ?Diagnosis Date  ? CAD (coronary artery disease)   ? LHC 11/06: pLAD 90, pD1 30, oD2 25 with inf branch 60; septal  perf ostial 95; prox MOM 25, RCA 25, oPL2 95, EF 65 >> PCI:  BMS to proximal LAD   ? FH: CAD (coronary artery disease)   ? History of echocardiogram   ? Echo 3/17:  Mod LVH, EF 55-60, no RWMA, mild AI, mild to mod RAE  ? History of tobacco abuse   ? Hyperlipidemia   ? Persistent atrial fibrillation ([place])   ? Xarelto  for a/c  ? ? ?Surgical History: ?Past Surgical History:  ?Procedure Laterality Date  ? CARDIAC CATHETERIZATION    ? CORONARY STENT PLACEMENT    ? stents of the proximal lad with bare metal stent  ? PERCUTAN

## 2021-08-11 NOTE — Evaluation (Signed)
Speech Language Pathology Evaluation ?Patient Details ?Name: [name] [name] ?MRN: [id number] ?DOB: Aug 29, 1940 ?Today's Date: 08/11/2021 ?Time: 0488-8916 ?SLP Time Calculation (min) (ACUTE ONLY): 23 min ? ?Problem List:  ?Patient Active Problem List  ? Diagnosis Date Noted  ? Renal failure 08/09/2021  ? Cellulitis   ? Sepsis with acute organ dysfunction without septic shock (HCC)   ? Moderate dementia with psychotic disturbance (HCC)   ? Acute renal failure (HCC) 08/08/2021  ? Hyperkalemia 08/08/2021  ? Hypermagnesemia 08/08/2021  ? Uremia 08/08/2021  ? Hyponatremia 08/08/2021  ? Coronary artery disease involving native coronary artery of native heart without angina pectoris 09/04/2016  ? Persistent atrial fibrillation (HCC) 09/04/2016  ? Hyperlipidemia   ? ?Past Medical History:  ?Past Medical History:  ?Diagnosis Date  ? CAD (coronary artery disease)   ? LHC 11/06: pLAD 90, pD1 30, oD2 25 with inf branch 60; septal perf ostial 95; prox MOM 25, RCA 25, oPL2 95, EF 65 >> PCI:  BMS to proximal LAD   ? FH: CAD (coronary artery disease)   ? History of echocardiogram   ? Echo 3/17:  Mod LVH, EF 55-60, no RWMA, mild AI, mild to mod RAE  ? History of tobacco abuse   ? Hyperlipidemia   ? Persistent atrial fibrillation (HCC)   ? Xarelto for a/c  ? ?Past Surgical History:  ?Past Surgical History:  ?Procedure Laterality Date  ? CARDIAC CATHETERIZATION    ? CORONARY STENT PLACEMENT    ? stents of the proximal lad with bare metal stent  ? PERCUTANEOUS CORONARY STENT INTERVENTION (PCI-S)    ? ?HPI:  ?Per MD notes "Patient presenting after being found to be doing generally unwell with worsening wounds during a wellness check, poor po, skipping meals, leg wounds" ? Cellulitis, worsening of chronic venous stasis wounds in the setting of no longer seeing wound care, Possible cellulitic superinfection in wounds, Afib with elevated in ED *110s to 120s, , fall from truck - hit found in 10/2019, prior smoker - stopped in the 80s, Mild ethmoid  sinus mucosal thickening, negative brain imaging 10/2019, Neuropsychiatric problems including potentially scratching the wounds, neglect per ID, neurology consulted; Per ID MD "I suspect he either has dementia that is declared itself recently or he could have had an psychotic break in the context of his severe depression and his "double life having been revealed." 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 Double life, rec eventual palliative  ? ?Assessment / Plan / Recommendation ?Clinical Impression ? SLUMS ([place] Mental Status Examination) completed with pt scoring 20/30 -  Strengths included visuospatial skills, orientation*except to date and day of week*.  Difficulties noted in memory and attention - Pt did not recall any of the objects independently - and only correctly identified 2 with multiple choice- indicative of storage and retrieval deficits. He benefits from repetition of questions due to his decreased attention to detail.  Pt advises that this is a new change for him.  No dysarthria or aphasia.  He will benefit from follow up SLP for cognitive linguistic treatment as he lives independently to maximize his function.  Pt educated and agreeable. ?   ?SLP Assessment ? SLP Recommendation/Assessment: Patient needs continued Speech Lanaguage Pathology Services ?SLP Visit Diagnosis: Cognitive communication deficit (R41.841)  ?  ?Recommendations for follow up therapy are one component of a multi-disciplinary discharge planning process, led by the attending physician.  Recommendations may be  updated based on patient status, additional functional criteria and insurance authorization. ?   ?Follow Up Recommendations ? Home health SLP  ?  ?Assistance Recommended at Discharge ? Intermittent Supervision/Assistance  ?Functional Status Assessment Patient has had a recent decline in their functional status and demonstrates the ability to make  significant improvements in function in a reasonable and predictable amount of time.  ?Frequency and Duration min 1 x/week  ?  ?  ?   ?SLP Evaluation ?Cognition ? Overall Cognitive Status: Impaired/Different from baseline (pt denies baseline deficits but per md notes, some question of status prior to admit) ?Year: 2023 ?Month: April ?Day of Week: Incorrect ?Attention: Sustained ?Sustained Attention: Impaired ?Sustained Attention Impairment: Verbal complex ?Memory: Impaired ?Memory Impairment: Storage deficit;Retrieval deficit;Decreased recall of new information ?Awareness: Appears intact ?Problem Solving: Impaired (needed repetition of functional math questions) ?Problem Solving Impairment: Functional complex ?Safety/Judgment: Appears intact  ?  ?   ?Comprehension ? Auditory Comprehension ?Overall Auditory Comprehension: Impaired ?Yes/No Questions: Not tested ?Commands: Within Functional Limits ?Conversation: Complex ?Visual Recognition/Discrimination ?Discrimination: Not tested ?Reading Comprehension ?Reading Status: Not tested  ?  ?Expression Expression ?Primary Mode of Expression: Verbal ?Verbal Expression ?Overall Verbal Expression: Appears within functional limits for tasks assessed ?Initiation: No impairment ?Repetition: Impaired ?Naming: Not tested ?Pragmatics: No impairment ?Written Expression ?Dominant Hand: Right ?Written Expression:  (able to draw a clock)   ?Oral / Motor ? Oral Motor/Sensory Function ?Overall Oral Motor/Sensory Function: Other (comment) ?Motor Speech ?Overall Motor Speech: Appears within functional limits for tasks assessed ?Respiration: Within functional limits ?Resonance: Within functional limits ?Articulation: Within functional limitis ?Intelligibility: Intelligible ?Motor Planning: Witnin functional limits ?Motor Speech Errors: Not applicable   ?        ? ?Chales Abrahams ?08/11/2021, 1:22 PM ?Rolena Infante, MS [facility] SLP ?Acute Rehab Services ?Office [phone] ?Pager [phone] ? ?

## 2021-08-11 NOTE — Progress Notes (Addendum)
ID Brief Note ? ? ?MRI left/Rt foot and tibia and fibula noted - No osteomyelitis in the left foot and left tibial/fibula. However, due to motion degradation, marrow edema vs artifact in the first distal phalanx in the RIGHT which may reflect Osteomyelitis. ? ?Vascular surgery has been consulted by Primary ? ?Please have Orthopedics evaluate regarding the MRI findings esp marrow edema vs artifact in the first distal phalanx which may reflex osteomyelitis.  ? ?On Unasyn  ? ?Dr Thedore Mins to follow from tomorrow.  ? ?Odette Fraction, MD ?Infectious Disease Physician ?[facility] ?[address] ?[place], [place] [address] ?Phone: [phone]  Fax: [phone] ? ? ?

## 2021-08-11 NOTE — Consult Note (Addendum)
?Hospital Consult ? ? ? ?Reason for Consult:  Bilateral lower extremity wounds ?Requesting Physician: Dr. Guss Bunde ?MRN #:  [id number] ? ?History of Present Illness: This is a 81 y.o. male who has a history of atrial fibrillation CAD, hyperlipidemia, venous stasis who presented with failure to thrive, general decline, worsening leg wounds.  Seth lives at home in [place].  An Acupuncturist by trade, he is now retired.  His family lives in the area.  Jonny Ruiz has had a history of chronic lower extremity wounds, however these have worsened over the last several months.  He has been taking poor care of himself with weight loss and progression in his wounds.  He was started on today by his ex-wife who brought to the emergency department due to general decline. ? ?Vascular surgery was called for recommendations regarding bilateral lower extremity wounds. ? ?On exam, done was alert and oriented, able to answer questions.  This has improved dramatically as he originally arrived confused.  Kidney function is improving slowly. ? ?Past Medical History:  ?Diagnosis Date  ? CAD (coronary artery disease)   ? LHC 11/06: pLAD 90, pD1 30, oD2 25 with inf branch 60; septal perf ostial 95; prox MOM 25, RCA 25, oPL2 95, EF 65 >> PCI:  BMS to proximal LAD   ? FH: CAD (coronary artery disease)   ? History of echocardiogram   ? Echo 3/17:  Mod LVH, EF 55-60, no RWMA, mild AI, mild to mod RAE  ? History of tobacco abuse   ? Hyperlipidemia   ? Persistent atrial fibrillation (HCC)   ? Xarelto for a/c  ? ? ?Past Surgical History:  ?Procedure Laterality Date  ? CARDIAC CATHETERIZATION    ? CORONARY STENT PLACEMENT    ? stents of the proximal lad with bare metal stent  ? PERCUTANEOUS CORONARY STENT INTERVENTION (PCI-S)    ? ? ?No Known Allergies ? ?Prior to Admission medications   ?Medication Sig Start Date End Date Taking? Authorizing Provider  ?apixaban (ELIQUIS) 5 MG TABS tablet TAKE 1 TABLET(5 MG) BY MOUTH TWICE DAILY ?Patient not taking:  Reported on 08/10/2021 03/15/21   Tonny Bollman, MD  ? ? ?Social History  ? ?Socioeconomic History  ? Marital status: Divorced  ?  Spouse name: Not on file  ? Number of children: Not on file  ? Years of education: Not on file  ? Highest education level: Not on file  ?Occupational History  ? Occupation: Art gallery manager  ?  Comment: owns small business  ?Tobacco Use  ? Smoking status: Former  ?  Types: Cigarettes  ?  Quit date: 05/10/1978  ?  Years since quitting: 43.2  ? Smokeless tobacco: Never  ?Vaping Use  ? Vaping Use: Never used  ?Substance and Sexual Activity  ? Alcohol use: No  ? Drug use: No  ? Sexual activity: Not on file  ?Other Topics Concern  ? Not on file  ?Social History Narrative  ? Not on file  ? ?Social Determinants of Health  ? ?Financial Resource Strain: Not on file  ?Food Insecurity: Not on file  ?Transportation Needs: Not on file  ?Physical Activity: Not on file  ?Stress: Not on file  ?Social Connections: Not on file  ?Intimate Partner Violence: Not on file  ? ?Family History  ?Problem Relation Age of Onset  ? Cancer - Colon Mother   ? Heart attack Father   ? Hypertension Father   ? ? ?ROS: Otherwise negative unless mentioned in HPI ? ?Physical  Examination ? ?Vitals:  ? 08/11/21 1926 08/11/21 2000  ?BP:  (!) 116/55  ?Pulse:  96  ?Resp:  19  ?Temp: 98.2 ?F (36.8 ?C)   ?SpO2:  100%  ? ?Body mass index is 21.95 kg/m?. ? ?General:  WDWN in NAD ?Gait: Not observed ?HENT: WNL, normocephalic ?Pulmonary: normal non-labored breathing, without Rales, rhonchi,  wheezing ?Cardiac: regular ?Abdomen: soft, NT/ND, no masses ?Skin: without rashes ?Vascular Exam/Pulses: 2+ PT pulses ?Extremities: without ischemic changes, without Gangrene , with cellulitis; with open wounds;  ?See photos in chart ?Musculoskeletal: Muscle wasting, atrophy, failure to thrive ?Neurologic: A&O X 3;  No focal weakness or paresthesias are detected; speech is fluent/normal ?Psychiatric:  The pt has Normal affect. ?Lymph:  Unremarkable ? ?CBC ?    ?Component Value Date/Time  ? WBC 7.0 08/09/2021 0536  ? RBC 3.97 (L) 08/09/2021 0536  ? HGB 11.2 (L) 08/09/2021 0536  ? HGB 13.7 03/15/2021 0906  ? HCT 33.1 (L) 08/09/2021 0536  ? HCT 41.9 03/15/2021 0906  ? PLT 130 (L) 08/09/2021 0536  ? PLT 247 03/15/2021 0906  ? MCV 83.4 08/09/2021 0536  ? MCV 85 03/15/2021 0906  ? MCH 28.2 08/09/2021 0536  ? MCHC 33.8 08/09/2021 0536  ? RDW 15.9 (H) 08/09/2021 0536  ? RDW 14.0 03/15/2021 0906  ? LYMPHSABS 0.5 (L) 08/08/2021 2100  ? LYMPHSABS 0.8 03/05/2020 1337  ? MONOABS 0.6 08/08/2021 2100  ? EOSABS 0.0 08/08/2021 2100  ? EOSABS 0.2 03/05/2020 1337  ? BASOSABS 0.0 08/08/2021 2100  ? BASOSABS 0.0 03/05/2020 1337  ? ? ?BMET ?   ?Component Value Date/Time  ? NA 136 08/11/2021 0255  ? NA 136 03/15/2021 0906  ? K 3.5 08/11/2021 0255  ? CL 112 (H) 08/11/2021 0255  ? CO2 18 (L) 08/11/2021 0255  ? GLUCOSE 101 (H) 08/11/2021 0255  ? BUN 49 (H) 08/11/2021 0255  ? BUN 30 (H) 03/15/2021 [id number]  ? CREATININE 1.31 (H) 08/11/2021 0255  ? CREATININE 1.23 (H) 07/09/2015 1240  ? CALCIUM 8.5 (L) 08/11/2021 0255  ? GFRNONAA 55 (L) 08/11/2021 0255  ? GFRAA 88 03/05/2020 1337  ? ? ?COAGS: ?No results found for: INR, PROTIME ? ? ?Non-Invasive Vascular Imaging:   ?none ? ? ?ASSESSMENT/PLAN: This is a 81 y.o. male who presents with failure to thrive.  He has had significant weight loss and has been unable to care for himself resulting in progression in bilateral lower extremity wounds.  The legs were wrapped on my exam, therefore I used images taken on 08/09/2021.  His bilateral lower extremity wounds are extensive.  While venous ulcerations usually occur proximal to the malleolus his, his significant tissue loss is accelerated due to poor hygiene and poor nutrition.  ? ?On physical exam, Roe CoombsDon had a palpable pulse in his feet bilaterally, which is encouraging.  From a vascular standpoint, he would benefit from aggressive wound care, with compression from the feet to the knees bilaterally. Once the  wound beds are clean, recommend Unaboots if able to tolerate. ? ?Venous insufficiency is formally diagnosed in the outpatient setting with duplex ultrasonography of both deep and superficial venous systems.  I am happy to see him in follow-up to discuss results once his failure to thrive and nutritional status have improved. ? ?Should he need debridement, recommend orthopedic versus general surgery.  He has sufficient arterial perfusion to heal his wounds. ? ?Recommend wound care consult in the outpatient setting, I will schedule follow-up. ?Pt would also benefit from podiatry for  nail care.  ? ? ?J. Gillis Santa MD MS ?[facility] ?[phone] ?08/11/2021  ?8:56 PM ? ?

## 2021-08-12 DIAGNOSIS — L0390 Cellulitis, unspecified: Secondary | ICD-10-CM | POA: Diagnosis not present

## 2021-08-12 DIAGNOSIS — N179 Acute kidney failure, unspecified: Secondary | ICD-10-CM | POA: Diagnosis not present

## 2021-08-12 DIAGNOSIS — N19 Unspecified kidney failure: Secondary | ICD-10-CM | POA: Diagnosis not present

## 2021-08-12 DIAGNOSIS — E440 Moderate protein-calorie malnutrition: Secondary | ICD-10-CM | POA: Insufficient documentation

## 2021-08-12 DIAGNOSIS — I4819 Other persistent atrial fibrillation: Secondary | ICD-10-CM | POA: Diagnosis not present

## 2021-08-12 LAB — HIV-1 RNA QUANT-NO REFLEX-BLD
HIV 1 RNA Quant: <20 copies/mL
LOG10 HIV-1 RNA: UNDETERMINED log10copy/mL

## 2021-08-12 MED ORDER — ENSURE ENLIVE PO LIQD
237.0000 mL | Freq: Two times a day (BID) | ORAL | Status: DC
  Administered 2021-08-12 – 2021-08-17 (×12): 237 mL via ORAL

## 2021-08-12 MED ORDER — JUVEN PO PACK
1.0000 | PACK | Freq: Two times a day (BID) | ORAL | Status: DC
  Administered 2021-08-12 – 2021-08-18 (×12): 1 via ORAL
  Filled 2021-08-12 (×10): qty 1

## 2021-08-12 MED ORDER — APIXABAN 2.5 MG PO TABS
2.5000 mg | ORAL_TABLET | Freq: Once | ORAL | Status: AC
  Administered 2021-08-12: 2.5 mg via ORAL
  Filled 2021-08-12: qty 1

## 2021-08-12 MED ORDER — APIXABAN 5 MG PO TABS
5.0000 mg | ORAL_TABLET | Freq: Two times a day (BID) | ORAL | Status: DC
  Administered 2021-08-12 – 2021-08-18 (×12): 5 mg via ORAL
  Filled 2021-08-12 (×12): qty 1

## 2021-08-12 NOTE — Progress Notes (Signed)
[facility] ? ?PROGRESS NOTE ? ?Cael Worth XBL:[id number] DOB: January 05, 1941 DOA: 08/08/2021 ?PCP: Doristine Bosworth, MD ? ? ?Brief HPI:   ?81 year old male with medical history of atrial fibrillation, CAD, hyperlipidemia, venous stasis, retinopathy presented with general decline and worsening leg wounds.  Wound care was consulted, ID and vascular surgery also consulted.  MRI of the foot shows marrow edema versus artifact involving the first distal phalanx of right foot which may reflect osteomyelitis. ? ? ? ?Subjective  ? ?Patient seen and examined, denies any complaints. ? ? Assessment/Plan:  ? ?Sepsis ?-Sepsis physiology has resolved ?-Patient started on vancomycin, Zosyn which was transitioned to Unasyn ?-Blood cultures are negative to date ?-ID following ? ??  Osteomyelitis ?-MRI of the right foot shows marrow edema versus artifact involving first distal phalanx of right foot which may reflect osteomyelitis ?-Called and discussed with Dr. Ophelia Charter, he will see patient in a.m. ? ?Venous stasis ulcers ?-Vascular surgery was consulted ?-Wound care consulted ?-Recommend aggressive wound care with compression from feet to knees bilaterally ?-Once wounds are clean recommend Unna boots ?-Vascular surgery recommends that he can undergo debridement if needed, per orthopedics versus general surgery, as he has sufficient arterial perfusion to heal his wounds ?-Vascular surgery to follow-up as outpatient ? ?Acute kidney injury ?-Patient's baseline creatinine around 1, presented with creatinine of 2.74 ?-Creatinine has improved to 1.31 ? ?Atrial fibrillation ?-Patient not on beta-blockers or calcium channel blocker due to intolerance to these medications ?-Heart rate is controlled ?-Continue Eliquis ? ?Metabolic encephalopathy ?-Resolved ?-Likely in the setting of sepsis due to venous stasis ulcers,?  Osteomyelitis with failure to thrive ? ? ? ?Medications ? ?  ? apixaban  5 mg Oral BID  ? Chlorhexidine Gluconate Cloth  6  each Topical Daily  ? feeding supplement  237 mL Oral BID BM  ? leptospermum manuka honey  1 application. Topical Daily  ? nutrition supplement (JUVEN)  1 packet Oral BID BM  ? sodium chloride flush  3 mL Intravenous Q12H  ? ? ? Data Reviewed:  ? ?CBG: ? ?No results for input(s): GLUCAP in the last 168 hours. ? ?SpO2: 98 %  ? ? ?Vitals:  ? 08/12/21 0800 08/12/21 1145 08/12/21 1200 08/12/21 1602  ?BP: 119/64  (!) 123/57   ?Pulse: (!) 25  92   ?Resp: 18  (!) 25   ?Temp:  (!) 97.4 ?F (36.3 ?C)  (!) 97.4 ?F (36.3 ?C)  ?TempSrc:  Oral  Oral  ?SpO2: 98%  98%   ?Weight:      ?Height:      ? ? ? ? ?Data Reviewed: ? ?Basic Metabolic Panel: ?Recent Labs  ?Lab 08/08/21 ?2100 08/09/21 ?0019 08/09/21 ?0930 08/09/21 ?1621 08/10/21 ?0258 08/10/21 ?3007 08/10/21 ?6226 08/10/21 ?1133 08/11/21 ?0255  ?NA 126*   < > 126*   < > 132* 132* 133* 134* 136  ?K 6.8*   < > 5.0   < > 3.6 3.6 3.6 3.8 3.5  ?CL 96*   < > 102   < > 108 109 110 112* 112*  ?CO2 20*   < > 16*   < > 16* 15* 15* 16* 18*  ?GLUCOSE 101*   < > 92   < > 81 75 78 78 101*  ?BUN 117*   < > 94*   < > 64* 63* 57* 56* 49*  ?CREATININE 2.74*   < > 1.88*   < > 1.44* 1.41* 1.38* 1.34* 1.31*  ?CALCIUM 9.1   < >  8.0*   < > 8.2* 8.3* 8.2* 8.2* 8.5*  ?MG 3.2*  --  2.3  --   --   --   --   --   --   ?PHOS  --    < > 3.6   < > 3.8 4.0 3.9 3.9 2.5  ? < > = values in this interval not displayed.  ? ? ?CBC: ?Recent Labs  ?Lab 08/08/21 ?2100 08/09/21 ?4742  ?WBC 7.5 7.0  ?NEUTROABS 6.2  --   ?HGB 14.5 11.2*  ?HCT 43.3 33.1*  ?MCV 83.8 83.4  ?PLT 146* 130*  ? ? ?LFT ?Recent Labs  ?Lab 08/08/21 ?2100 08/09/21 ?5956 08/10/21 ?3875 08/10/21 ?6433 08/10/21 ?2951 08/10/21 ?1133 08/11/21 ?0255  ?AST 31  --   --   --   --   --   --   ?ALT 20  --   --   --   --   --   --   ?ALKPHOS 98  --   --   --   --   --   --   ?BILITOT 0.6  --   --   --   --   --   --   ?PROT 7.7  --   --   --   --   --   --   ?ALBUMIN 3.2*   < > 2.2* 2.3* 2.2* 2.2* 2.3*  ? < > = values in this interval not displayed.  ? ?   ?Antibiotics: ?Anti-infectives (From admission, onward)  ? ? Start     Dose/Rate Route Frequency Ordered Stop  ? 08/10/21 1100  Ampicillin-Sulbactam (UNASYN) 3 g in sodium chloride 0.9 % 100 mL IVPB       ? 3 g ?200 mL/hr over 30 Minutes Intravenous Every 6 hours 08/10/21 1002    ? 08/10/21 0200  ceFEPIme (MAXIPIME) 2 g in sodium chloride 0.9 % 100 mL IVPB  Status:  Discontinued       ? 2 g ?200 mL/hr over 30 Minutes Intravenous Every 24 hours 08/09/21 0015 08/10/21 1002  ? 08/09/21 2000  DAPTOmycin (CUBICIN) 600 mg in sodium chloride 0.9 % IVPB  Status:  Discontinued       ? 8 mg/kg ? 74.8 kg ?124 mL/hr over 30 Minutes Intravenous Daily 08/09/21 1459 08/10/21 1002  ? 08/09/21 0015  vancomycin variable dose per unstable renal function (pharmacist dosing)  Status:  Discontinued       ?  Does not apply See admin instructions 08/09/21 0015 08/09/21 1459  ? 08/08/21 2330  ceFEPIme (MAXIPIME) 2 g in sodium chloride 0.9 % 100 mL IVPB       ? 2 g ?200 mL/hr over 30 Minutes Intravenous  Once 08/08/21 2328 08/09/21 0453  ? 08/08/21 2200  vancomycin (VANCOREADY) IVPB 1500 mg/300 mL       ? 1,500 mg ?150 mL/hr over 120 Minutes Intravenous  Once 08/08/21 2150 08/09/21 0226  ? 08/08/21 2145  piperacillin-tazobactam (ZOSYN) IVPB 3.375 g  Status:  Discontinued       ? 3.375 g ?100 mL/hr over 30 Minutes Intravenous  Once 08/08/21 2140 08/08/21 2324  ? ?  ? ? ? ?DVT prophylaxis: Apixaban ? ?Code Status: Full code ? ?Family Communication: No family at bedside ? ? ?CONSULTS  ? ? ?Objective  ? ? ?Physical Examination: ? ? ?General-appears in no acute distress ?Heart-S1-S2, regular, no murmur auscultated ?Lungs-clear to auscultation bilaterally, no wheezing or crackles auscultated ?Abdomen-soft, nontender, no organomegaly ?  Extremities-bilateral lower extremities in dressing ?Neuro-alert, oriented x3, no focal deficit noted ? ? ?Status is: Inpatient:   ? ? ?  ? ?Meredeth IdeGagan S Romell Wolden ?  ?[facility] ?If 7PM-7AM, please contact  night-coverage at [email or web address], ?Office  [phone] ? ? ?08/12/2021, 6:05 PM  LOS: 3 days  ? ? ? ? ? ? ? ? ? ? ?  ?

## 2021-08-12 NOTE — Progress Notes (Signed)
Initial Nutrition Assessment ? ?DOCUMENTATION CODES:  ? ?Non-severe (moderate) malnutrition in context of chronic illness ? ?INTERVENTION:  ?- will order Ensure Plus High Protein BID, each supplement provides 350 kcal and 20 grams of protein. ? ?- will order 1 packet Juven BID, each packet provides 95 calories, 2.5 grams of protein (collagen), and 9.8 grams of carbohydrate (3 grams sugar); also contains 7 grams of L-arginine and L-glutamine, 300 mg vitamin C, 15 mg vitamin E, 1.2 mcg vitamin B-12, 9.5 mg zinc, 200 mg calcium, and 1.5 g  Calcium Beta-hydroxy-Beta-methylbutyrate to support wound healing. ? ? ?NUTRITION DIAGNOSIS:  ? ?Moderate Malnutrition related to chronic illness (chronic BLE wounds) as evidenced by mild fat depletion, moderate muscle depletion. ? ?GOAL:  ? ?Patient will meet greater than or equal to 90% of their needs ? ?MONITOR:  ? ?PO intake, Supplement acceptance, Labs, Weight trends, Skin ? ?REASON FOR ASSESSMENT:  ? ?Consult ?Assessment of nutrition requirement/status ? ?ASSESSMENT:  ? ?81 y.o. male with medical history of afib, CAD, HLD, venous stasis, and retinopathy. He presented to the ED due to general decline and worsening BLE wounds. Notes indicate that patient has been living alone following a recent divorce. 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 His previous ex-wife has been checking on him by phone but had not seen him in some time. Patient was checked on for well check and found to have general decline and significant worsening of his leg wounds. He was admitted for acute renal failure and sepsis. ? ?Flow sheet documentation indicates patient ate 100% of breakfast and 100% of lunch yesterday and 100% of breakfast today. ? ?Patient laying in bed with no visitors present at the time of RD visit. He shares that he ate well for breakfast but is unable to recall what he ate. ? ?Patient noted to have dentures and reports they fit  well, no movement or rubbing when talking or eating.  ? ?He reports that appetite PTA was at baseline and he denies issues with access to food. He does a combination of eating meals and snacking.  ? ?He reports immobility since admission but that he was ambulating as usual PTA. He reports having a walker at home but that he does not consistently use it.  ? ?He indicates that he has had Ensure or similar at home in the past and is receptive to receiving oral nutrition supplements during hospitalization.  ? ?Patient denies any weight changes or changes in the way his clothes have been fitting.  ? ?Weight today is 164 lb and PTA the most recently documented weight was 186 lb on 03/15/21 which indicates 22 lb weight loss (11.8% body weight) in the past 5 months; significant for time frame.  ? ?Able to talk with RN after visit to patient's room. She indicates that mentation is improved. ? ? ?Labs reviewed; Cl: 112 mmol/l, BUN: 49 mg/dl, creatinine: 9.38 mg/dl, Ca: 8.5 mg/dl, GFR: 55 ml/min. ?Medications reviewed. ?  ? ? ?NUTRITION - FOCUSED PHYSICAL EXAM: ? ?Flowsheet Row Most Recent Value  ?Orbital Region No depletion  ?Upper Arm Region Mild depletion  ?Thoracic and Lumbar Region Unable to assess  ?Buccal Region Mild depletion  ?Temple Region No depletion  ?Clavicle Bone Region Moderate depletion  ?Clavicle and Acromion Bone Region Moderate depletion  ?Scapular Bone Region Moderate depletion  ?Dorsal Hand Mild depletion  ?Patellar Region No depletion  ?Anterior Thigh Region No depletion  ?Posterior  Calf Region Unable to assess  [prevlon boots]  ?Edema (RD Assessment) Unable to assess  [RN flow sheet indicates moderate edema to BLE]  ?Hair Reviewed  ?Eyes Reviewed  ?Mouth Reviewed  [dentures]  ?Skin Reviewed  ?Nails Reviewed  ? ?  ? ? ?Diet Order:   ?Diet Order   ? ?       ?  Diet regular Room service appropriate? Yes; Fluid consistency: Thin  Diet effective now       ?  ? ?  ?  ? ?  ? ? ?EDUCATION NEEDS:  ? ?Education  needs have been addressed ? ?Skin:  Skin Assessment: Skin Integrity Issues: ?Skin Integrity Issues:: Other (Comment) ?Other: BLE venous stasis wounds ? ?Last BM:  PTA/unknown ? ?Height:  ? ?Ht Readings from Last 1 Encounters:  ?08/09/21 5\' 11"  (1.803 m)  ? ? ?Weight:  ? ?Wt Readings from Last 1 Encounters:  ?08/12/21 74.3 kg  ? ? ? ?BMI:  Body mass index is 22.85 kg/m?. ? ?Estimated Nutritional Needs:  ?Kcal:  2000-2250 kcal ?Protein:  100-115 grams ?Fluid:  >/= 2.2 L/day ? ? ? ? ?08/14/21, MS, RD, LDN ?Registered Dietitian II ?Inpatient Clinical Nutrition ?RD pager # and on-call/weekend pager # available in AMION  ? ?

## 2021-08-13 DIAGNOSIS — L0390 Cellulitis, unspecified: Secondary | ICD-10-CM | POA: Diagnosis not present

## 2021-08-13 DIAGNOSIS — E440 Moderate protein-calorie malnutrition: Secondary | ICD-10-CM

## 2021-08-13 DIAGNOSIS — N179 Acute kidney failure, unspecified: Secondary | ICD-10-CM | POA: Diagnosis not present

## 2021-08-13 DIAGNOSIS — I4819 Other persistent atrial fibrillation: Secondary | ICD-10-CM | POA: Diagnosis not present

## 2021-08-13 LAB — CULTURE, BLOOD (ROUTINE X 2)
Culture: NO GROWTH
Culture: NO GROWTH
Special Requests: ADEQUATE
Special Requests: ADEQUATE

## 2021-08-13 LAB — CBC
HCT: 31.3 % — ABNORMAL LOW (ref 39.0–52.0)
Hemoglobin: 10 g/dL — ABNORMAL LOW (ref 13.0–17.0)
MCH: 28.1 pg (ref 26.0–34.0)
MCHC: 31.9 g/dL (ref 30.0–36.0)
MCV: 87.9 fL (ref 80.0–100.0)
Platelets: 172 10*3/uL (ref 150–400)
RBC: 3.56 MIL/uL — ABNORMAL LOW (ref 4.22–5.81)
RDW: 17.1 % — ABNORMAL HIGH (ref 11.5–15.5)
WBC: 4.5 10*3/uL (ref 4.0–10.5)
nRBC: 0 % (ref 0.0–0.2)

## 2021-08-13 LAB — COMPREHENSIVE METABOLIC PANEL
ALT: 20 U/L (ref 0–44)
AST: 30 U/L (ref 15–41)
Albumin: 2 g/dL — ABNORMAL LOW (ref 3.5–5.0)
Alkaline Phosphatase: 71 U/L (ref 38–126)
Anion gap: 4 — ABNORMAL LOW (ref 5–15)
BUN: 47 mg/dL — ABNORMAL HIGH (ref 8–23)
CO2: 23 mmol/L (ref 22–32)
Calcium: 8.2 mg/dL — ABNORMAL LOW (ref 8.9–10.3)
Chloride: 113 mmol/L — ABNORMAL HIGH (ref 98–111)
Creatinine, Ser: 1.06 mg/dL (ref 0.61–1.24)
GFR, Estimated: >60 mL/min (ref >60)
Glucose, Bld: 115 mg/dL — ABNORMAL HIGH (ref 70–99)
Potassium: 4.3 mmol/L (ref 3.5–5.1)
Sodium: 140 mmol/L (ref 135–145)
Total Bilirubin: 0.6 mg/dL (ref 0.3–1.2)
Total Protein: 5.2 g/dL — ABNORMAL LOW (ref 6.5–8.1)

## 2021-08-13 NOTE — Progress Notes (Signed)
[facility] ? ?PROGRESS NOTE ? ?[name] [name] [id number] DOB: 1940/07/03 DOA: 08/08/2021 ?PCP: Forrest Moron, MD ? ? ?Brief HPI:   ?81 year old male with medical history of atrial fibrillation, CAD, hyperlipidemia, venous stasis, retinopathy presented with general decline and worsening leg wounds.  Wound care was consulted, ID and vascular surgery also consulted.  MRI of the foot shows marrow edema versus artifact involving the first distal phalanx of right foot which may reflect osteomyelitis. ? ? ? ?Subjective  ? ?Patient seen and examined, no new complaints. ? ? Assessment/Plan:  ? ?Sepsis ?-Sepsis physiology has resolved ?-Patient started on vancomycin, Zosyn which was transitioned to Unasyn ?-Blood cultures are negative to date ?-ID following ? ??  Osteomyelitis ?-MRI of the right foot shows marrow edema versus artifact involving first distal phalanx of right foot which may reflect osteomyelitis ?-Called and discussed with Dr. Lorin Mercy, he will see patient today ? ?Venous stasis ulcers ?-Vascular surgery was consulted ?-Wound care consulted ?-Recommend aggressive wound care with compression from feet to knees bilaterally ?-Once wounds are clean recommend Unna boots ?-Vascular surgery recommends that he can undergo debridement if needed, per orthopedics versus general surgery, as he has sufficient arterial perfusion to heal his wounds ?-Vascular surgery to follow-up as outpatient ? ?Acute kidney injury ?-Patient's baseline creatinine around 1, presented with creatinine of 2.74 ?-Creatinine has improved to 1.06 ? ?Atrial fibrillation ?-Patient not on beta-blockers or calcium channel blocker due to intolerance to these medications ?-Heart rate is controlled ?-Continue Eliquis ? ?Metabolic encephalopathy ?-Resolved ?-Likely in the setting of sepsis due to venous stasis ulcers,?  Osteomyelitis with failure to thrive ? ? ? ?Medications ? ?  ? apixaban  5 mg Oral BID  ? Chlorhexidine Gluconate Cloth  6 each  Topical Daily  ? feeding supplement  237 mL Oral BID BM  ? leptospermum manuka honey  1 application. Topical Daily  ? nutrition supplement (JUVEN)  1 packet Oral BID BM  ? sodium chloride flush  3 mL Intravenous Q12H  ? ? ? Data Reviewed:  ? ?CBG: ? ?No results for input(s): GLUCAP in the last 168 hours. ? ?SpO2: 100 %  ? ? ?Vitals:  ? 08/13/21 0800 08/13/21 0805 08/13/21 1000 08/13/21 1131  ?BP: (!) 124/40  135/90 125/64  ?Pulse: 93  99 (!) 107  ?Resp: 18  (!) 24 15  ?Temp:  97.7 ?F (36.5 ?C)  97.7 ?F (36.5 ?C)  ?TempSrc:  Axillary  Oral  ?SpO2: 100%  100% 100%  ?Weight:      ?Height:      ? ? ? ? ?Data Reviewed: ? ?Basic Metabolic Panel: ?Recent Labs  ?Lab 08/08/21 ?2100 08/09/21 ?0019 08/09/21 ?0930 08/09/21 ?1621 08/10/21 ?0258 08/10/21 ?[id number] 08/10/21 ?0806 08/10/21 ?1133 08/11/21 ?0255 08/13/21 ?0300  ?NA 126*   < > 126*   < > 132* 132* 133* 134* 136 140  ?K 6.8*   < > 5.0   < > 3.6 3.6 3.6 3.8 3.5 4.3  ?CL 96*   < > 102   < > 108 109 110 112* 112* 113*  ?CO2 20*   < > 16*   < > 16* 15* 15* 16* 18* 23  ?GLUCOSE 101*   < > 92   < > 81 75 78 78 101* 115*  ?BUN 117*   < > 94*   < > 64* 63* 57* 56* 49* 47*  ?CREATININE 2.74*   < > 1.88*   < > 1.44* 1.41* 1.38* 1.34* 1.31*  1.06  ?CALCIUM 9.1   < > 8.0*   < > 8.2* 8.3* 8.2* 8.2* 8.5* 8.2*  ?MG 3.2*  --  2.3  --   --   --   --   --   --   --   ?PHOS  --    < > 3.6   < > 3.8 4.0 3.9 3.9 2.5  --   ? < > = values in this interval not displayed.  ? ? ?CBC: ?Recent Labs  ?Lab 08/08/21 ?2100 08/09/21 ?[id number] 08/13/21 ?0300  ?WBC 7.5 7.0 4.5  ?NEUTROABS 6.2  --   --   ?HGB 14.5 11.2* 10.0*  ?HCT 43.3 33.1* 31.3*  ?MCV 83.8 83.4 87.9  ?PLT 146* 130* 172  ? ? ?LFT ?Recent Labs  ?Lab 08/08/21 ?2100 08/09/21 ?0019 08/10/21 ?[id number] 08/10/21 ?[id number] 08/10/21 ?1133 08/11/21 ?0255 08/13/21 ?0300  ?AST 31  --   --   --   --   --  30  ?ALT 20  --   --   --   --   --  20  ?ALKPHOS 98  --   --   --   --   --  71  ?BILITOT 0.6  --   --   --   --   --  0.6  ?PROT 7.7  --   --   --   --   --   5.2*  ?ALBUMIN 3.2*   < > 2.3* 2.2* 2.2* 2.3* 2.0*  ? < > = values in this interval not displayed.  ? ?  ?Antibiotics: ?Anti-infectives (From admission, onward)  ? ? Start     Dose/Rate Route Frequency Ordered Stop  ? 08/10/21 1100  Ampicillin-Sulbactam (UNASYN) 3 g in sodium chloride 0.9 % 100 mL IVPB       ? 3 g ?200 mL/hr over 30 Minutes Intravenous Every 6 hours 08/10/21 1002    ? 08/10/21 0200  ceFEPIme (MAXIPIME) 2 g in sodium chloride 0.9 % 100 mL IVPB  Status:  Discontinued       ? 2 g ?200 mL/hr over 30 Minutes Intravenous Every 24 hours 08/09/21 0015 08/10/21 1002  ? 08/09/21 2000  DAPTOmycin (CUBICIN) 600 mg in sodium chloride 0.9 % IVPB  Status:  Discontinued       ? 8 mg/kg ? 74.8 kg ?124 mL/hr over 30 Minutes Intravenous Daily 08/09/21 1459 08/10/21 1002  ? 08/09/21 0015  vancomycin variable dose per unstable renal function (pharmacist dosing)  Status:  Discontinued       ?  Does not apply See admin instructions 08/09/21 0015 08/09/21 1459  ? 08/08/21 2330  ceFEPIme (MAXIPIME) 2 g in sodium chloride 0.9 % 100 mL IVPB       ? 2 g ?200 mL/hr over 30 Minutes Intravenous  Once 08/08/21 2328 08/09/21 0453  ? 08/08/21 2200  vancomycin (VANCOREADY) IVPB 1500 mg/300 mL       ? 1,500 mg ?150 mL/hr over 120 Minutes Intravenous  Once 08/08/21 2150 08/09/21 0226  ? 08/08/21 2145  piperacillin-tazobactam (ZOSYN) IVPB 3.375 g  Status:  Discontinued       ? 3.375 g ?100 mL/hr over 30 Minutes Intravenous  Once 08/08/21 2140 08/08/21 2324  ? ?  ? ? ? ?DVT prophylaxis: Apixaban ? ?Code Status: Full code ? ?Family Communication: No family at bedside ? ? ?CONSULTS  ? ? ?Objective  ? ? ?Physical Examination: ? ? ?General-appears in no acute distress ?Heart-S1-S2, regular, no  murmur auscultated ?Lungs-clear to auscultation bilaterally, no wheezing or crackles auscultated ?Abdomen-soft, nontender, no organomegaly ?Extremities-both lower extremities in dressing  ?Neuro-alert, oriented x3, no focal deficit noted ? ? ?Status  is: Inpatient:   ? ? ?  ? ?[name] [name] ?  ?[facility] ?If 7PM-7AM, please contact night-coverage at [email or web address], ?Office  [phone] ? ? ?08/13/2021, 2:14 PM  LOS: 4 days  ? ? ? ? ? ? ? ? ? ? ?  ?

## 2021-08-13 NOTE — Progress Notes (Signed)
Pt's belongings collected. Report called. Pt's daughter notified of transfer. Pt's stable at this time ?

## 2021-08-14 DIAGNOSIS — I4819 Other persistent atrial fibrillation: Secondary | ICD-10-CM | POA: Diagnosis not present

## 2021-08-14 DIAGNOSIS — L0390 Cellulitis, unspecified: Secondary | ICD-10-CM | POA: Diagnosis not present

## 2021-08-14 DIAGNOSIS — E440 Moderate protein-calorie malnutrition: Secondary | ICD-10-CM | POA: Diagnosis not present

## 2021-08-14 DIAGNOSIS — N179 Acute kidney failure, unspecified: Secondary | ICD-10-CM | POA: Diagnosis not present

## 2021-08-14 NOTE — Progress Notes (Signed)
[facility] ? ?PROGRESS NOTE ? ?[name] [name] [id number] DOB: [birth year] DOA: 08/08/2021 ?PCP: Forrest Moron, MD ? ? ?Brief HPI:   ?81 year old male with medical history of atrial fibrillation, CAD, hyperlipidemia, venous stasis, retinopathy presented with general decline and worsening leg wounds.  Wound care was consulted, ID and vascular surgery also consulted.  MRI of the foot shows marrow edema versus artifact involving the first distal phalanx of right foot which may reflect osteomyelitis. ? ? ? ?Subjective  ? ?Patient seen and examined, denies any complaints. ? ? Assessment/Plan:  ? ?Sepsis ?-Sepsis physiology has resolved ?-Patient started on vancomycin, Zosyn which was transitioned to Unasyn ?-Blood cultures are negative to date ?-ID following ? ??  Osteomyelitis ?-MRI of the right foot shows marrow edema versus artifact involving first distal phalanx of right foot which may reflect osteomyelitis ?-Called and discussed with Dr. Lorin Mercy, he reviewed the scans and have consulted Dr. Sharol Given to see as outpatient ?-We will discuss with ID regarding IV antibiotics ? ?Venous stasis ulcers ?-Vascular surgery was consulted ?-Wound care consulted ?-Recommend aggressive wound care with compression from feet to knees bilaterally ?-Once wounds are clean recommend Unna boots ?-Vascular surgery recommends that he can undergo debridement if needed, per orthopedics versus general surgery, as he has sufficient arterial perfusion to heal his wounds ?-Vascular surgery to follow-up as outpatient ? ?Acute kidney injury ?-Patient's baseline creatinine around 1, presented with creatinine of 2.74 ?-Creatinine has improved to 1.06 ? ?Atrial fibrillation ?-Patient not on beta-blockers or calcium channel blocker due to intolerance to these medications ?-Heart rate is controlled ?-Continue Eliquis ? ?Metabolic encephalopathy ?-Resolved ?-Likely in the setting of sepsis due to venous stasis ulcers,?  Osteomyelitis with failure to  thrive ? ? ? ?Medications ? ?  ? apixaban  5 mg Oral BID  ? feeding supplement  237 mL Oral BID BM  ? leptospermum manuka honey  1 application. Topical Daily  ? nutrition supplement (JUVEN)  1 packet Oral BID BM  ? sodium chloride flush  3 mL Intravenous Q12H  ? ? ? Data Reviewed:  ? ?CBG: ? ?No results for input(s): GLUCAP in the last 168 hours. ? ?SpO2: 100 %  ? ? ?Vitals:  ? 08/13/21 1728 08/13/21 2155 08/14/21 0122 08/14/21 0525  ?BP: 126/72 104/64 104/63 105/63  ?Pulse: (!) 101 91 86 89  ?Resp: 15 18 18 16   ?Temp: 97.7 ?F (36.5 ?C) 98.7 ?F (37.1 ?C) 98.5 ?F (36.9 ?C) (!) 97.5 ?F (36.4 ?C)  ?TempSrc: Oral Oral Oral Oral  ?SpO2: 100% 100% 96% 100%  ?Weight:      ?Height:      ? ? ? ? ?Data Reviewed: ? ?Basic Metabolic Panel: ?Recent Labs  ?Lab 08/08/21 ?2100 08/09/21 ?0019 08/09/21 ?0930 08/09/21 ?1621 08/10/21 ?0258 08/10/21 ?[id number] 08/10/21 ?0806 08/10/21 ?1133 08/11/21 ?0255 08/13/21 ?0300  ?NA 126*   < > 126*   < > 132* 132* 133* 134* 136 140  ?K 6.8*   < > 5.0   < > 3.6 3.6 3.6 3.8 3.5 4.3  ?CL 96*   < > 102   < > 108 109 110 112* 112* 113*  ?CO2 20*   < > 16*   < > 16* 15* 15* 16* 18* 23  ?GLUCOSE 101*   < > 92   < > 81 75 78 78 101* 115*  ?BUN 117*   < > 94*   < > 64* 63* 57* 56* 49* 47*  ?CREATININE 2.74*   < >  1.88*   < > 1.44* 1.41* 1.38* 1.34* 1.31* 1.06  ?CALCIUM 9.1   < > 8.0*   < > 8.2* 8.3* 8.2* 8.2* 8.5* 8.2*  ?MG 3.2*  --  2.3  --   --   --   --   --   --   --   ?PHOS  --    < > 3.6   < > 3.8 4.0 3.9 3.9 2.5  --   ? < > = values in this interval not displayed.  ? ? ?CBC: ?Recent Labs  ?Lab 08/08/21 ?2100 08/09/21 ?[id number] 08/13/21 ?0300  ?WBC 7.5 7.0 4.5  ?NEUTROABS 6.2  --   --   ?HGB 14.5 11.2* 10.0*  ?HCT 43.3 33.1* 31.3*  ?MCV 83.8 83.4 87.9  ?PLT 146* 130* 172  ? ? ?LFT ?Recent Labs  ?Lab 08/08/21 ?2100 08/09/21 ?0019 08/10/21 ?[id number] 08/10/21 ?[id number] 08/10/21 ?1133 08/11/21 ?0255 08/13/21 ?0300  ?AST 31  --   --   --   --   --  30  ?ALT 20  --   --   --   --   --  20  ?ALKPHOS 98  --   --   --   --    --  71  ?BILITOT 0.6  --   --   --   --   --  0.6  ?PROT 7.7  --   --   --   --   --  5.2*  ?ALBUMIN 3.2*   < > 2.3* 2.2* 2.2* 2.3* 2.0*  ? < > = values in this interval not displayed.  ? ?  ?Antibiotics: ?Anti-infectives (From admission, onward)  ? ? Start     Dose/Rate Route Frequency Ordered Stop  ? 08/10/21 1100  Ampicillin-Sulbactam (UNASYN) 3 g in sodium chloride 0.9 % 100 mL IVPB       ? 3 g ?200 mL/hr over 30 Minutes Intravenous Every 6 hours 08/10/21 1002    ? 08/10/21 0200  ceFEPIme (MAXIPIME) 2 g in sodium chloride 0.9 % 100 mL IVPB  Status:  Discontinued       ? 2 g ?200 mL/hr over 30 Minutes Intravenous Every 24 hours 08/09/21 0015 08/10/21 1002  ? 08/09/21 2000  DAPTOmycin (CUBICIN) 600 mg in sodium chloride 0.9 % IVPB  Status:  Discontinued       ? 8 mg/kg ? 74.8 kg ?124 mL/hr over 30 Minutes Intravenous Daily 08/09/21 1459 08/10/21 1002  ? 08/09/21 0015  vancomycin variable dose per unstable renal function (pharmacist dosing)  Status:  Discontinued       ?  Does not apply See admin instructions 08/09/21 0015 08/09/21 1459  ? 08/08/21 2330  ceFEPIme (MAXIPIME) 2 g in sodium chloride 0.9 % 100 mL IVPB       ? 2 g ?200 mL/hr over 30 Minutes Intravenous  Once 08/08/21 2328 08/09/21 0453  ? 08/08/21 2200  vancomycin (VANCOREADY) IVPB 1500 mg/300 mL       ? 1,500 mg ?150 mL/hr over 120 Minutes Intravenous  Once 08/08/21 2150 08/09/21 0226  ? 08/08/21 2145  piperacillin-tazobactam (ZOSYN) IVPB 3.375 g  Status:  Discontinued       ? 3.375 g ?100 mL/hr over 30 Minutes Intravenous  Once 08/08/21 2140 08/08/21 2324  ? ?  ? ? ? ?DVT prophylaxis: Apixaban ? ?Code Status: Full code ? ?Family Communication: No family at bedside ? ? ?CONSULTS  ? ? ?Objective  ? ? ?Physical Examination: ? ? ?  General-appears in no acute distress ?Heart-S1-S2, regular, no murmur auscultated ?Lungs-clear to auscultation bilaterally, no wheezing or crackles auscultated ?Abdomen-soft, nontender, no organomegaly ?Extremities-bilateral  lower extremities in dressing  ?Neuro-alert, oriented x3, no focal deficit noted ? ? ?Status is: Inpatient:   ? ? ?  ? ?Oswald Hillock ?  ?[facility] ?If 7PM-7AM, please contact night-coverage at [email or web address], ?Office  [phone] ? ? ?08/14/2021, 1:18 PM  LOS: 5 days  ? ? ? ? ? ? ? ? ? ? ?  ?

## 2021-08-15 DIAGNOSIS — L0390 Cellulitis, unspecified: Secondary | ICD-10-CM | POA: Diagnosis not present

## 2021-08-15 DIAGNOSIS — M86271 Subacute osteomyelitis, right ankle and foot: Secondary | ICD-10-CM

## 2021-08-15 DIAGNOSIS — N179 Acute kidney failure, unspecified: Secondary | ICD-10-CM | POA: Diagnosis not present

## 2021-08-15 DIAGNOSIS — I872 Venous insufficiency (chronic) (peripheral): Secondary | ICD-10-CM | POA: Diagnosis not present

## 2021-08-15 DIAGNOSIS — T148XXA Other injury of unspecified body region, initial encounter: Secondary | ICD-10-CM

## 2021-08-15 LAB — BASIC METABOLIC PANEL
Anion gap: 3 — ABNORMAL LOW (ref 5–15)
BUN: 36 mg/dL — ABNORMAL HIGH (ref 8–23)
CO2: 28 mmol/L (ref 22–32)
Calcium: 8 mg/dL — ABNORMAL LOW (ref 8.9–10.3)
Chloride: 107 mmol/L (ref 98–111)
Creatinine, Ser: 1.09 mg/dL (ref 0.61–1.24)
GFR, Estimated: >60 mL/min (ref >60)
Glucose, Bld: 96 mg/dL (ref 70–99)
Potassium: 3.9 mmol/L (ref 3.5–5.1)
Sodium: 138 mmol/L (ref 135–145)

## 2021-08-15 MED ORDER — AMOXICILLIN-POT CLAVULANATE 875-125 MG PO TABS
1.0000 | ORAL_TABLET | Freq: Two times a day (BID) | ORAL | Status: DC
  Administered 2021-08-15 – 2021-08-18 (×7): 1 via ORAL
  Filled 2021-08-15 (×7): qty 1

## 2021-08-15 NOTE — TOC Progression Note (Signed)
Transition of Care (TOC) - Progression Note  ? ? ?Patient Details  ?Name: [name] [name] ?MRN: [id number] ?Date of Birth: [birth year] ? ?Transition of Care (TOC) CM/SW Contact  ?Stephens Shreve, LCSW ?Phone Number: ?08/15/2021, 12:25 PM ? ?Clinical Narrative:    ?Met with pt and daughter, [name], today to review dc plans.  Pt pleasant and overall oriented to his situation.  Daughter is hopeful pt can go to SNF for rehab and pt is agreeable with this plan.  Have requested that MD order PT and OT to get needed documentation of SNF need.  Daughter able to complete HCPOA and Living Will today with Chaplain as there are several complicated family issues currently.  Await therapy evals at this point. ? ? ?Expected Discharge Plan: [place] ?Barriers to Discharge: Continued Medical Work up ? ?Expected Discharge Plan and Services ?Expected Discharge Plan: [place] ?  ?  ?  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                ?  ?  ?  ?  ?  ?  ?  ?  ?  ?  ? ? ?Social Determinants of Health (SDOH) Interventions ?  ? ?Readmission Risk Interventions ?   ? View : No data to display.  ?  ?  ?  ? ? ?

## 2021-08-15 NOTE — Progress Notes (Signed)
[facility] ? ?PROGRESS NOTE ? ?[name] [name] AFB:[id number] DOB: [birth year] DOA: 08/08/2021 ?PCP: Doristine Bosworth, MD ? ? ?Brief HPI:   ?81 year old male with medical history of atrial fibrillation, CAD, hyperlipidemia, venous stasis, retinopathy presented with general decline and worsening leg wounds.  Wound care was consulted, ID and vascular surgery also consulted.  MRI of the foot shows marrow edema versus artifact involving the first distal phalanx of right foot which may reflect osteomyelitis. ? ? ? ?Subjective  ? ?Patient seen and examined, no new complaints. ? ? Assessment/Plan:  ? ?Sepsis ?-Sepsis physiology has resolved ?-Patient started on vancomycin, Zosyn which was transitioned to Unasyn ?-Blood cultures are negative to date ?-ID following. ? ??  Osteomyelitis ?-MRI of the right foot shows marrow edema versus artifact involving first distal phalanx of right foot which may reflect osteomyelitis ?-Called and discussed with Dr. Ophelia Charter, he reviewed the scans and have consulted Dr. Lajoyce Corners to see as outpatient ?-Discussed with Dr. Renold Luismiguel, he will see patient and make recommendations regarding IV antibiotics. ? ?Venous stasis ulcers ?-Vascular surgery was consulted ?-Wound care consulted ?-Recommend aggressive wound care with compression from feet to knees bilaterally ?-Once wounds are clean recommend Unna boots ?-Vascular surgery recommends that he can undergo debridement if needed, per orthopedics versus general surgery, as he has sufficient arterial perfusion to heal his wounds ?-Vascular surgery to follow-up as outpatient ? ?Acute kidney injury ?-Patient's baseline creatinine around 1, presented with creatinine of 2.74 ?-Creatinine has improved to 1.06 ? ?Atrial fibrillation ?-Patient not on beta-blockers or calcium channel blocker due to intolerance to these medications ?-Heart rate is controlled ?-Continue Eliquis ? ?Metabolic encephalopathy ?-Resolved ?-Likely in the setting of sepsis due to venous stasis  ulcers ?-?  Osteomyelitis with failure to thrive ? ? ? ?Medications ? ?  ? amoxicillin-clavulanate  1 tablet Oral Q12H  ? apixaban  5 mg Oral BID  ? feeding supplement  237 mL Oral BID BM  ? leptospermum manuka honey  1 application. Topical Daily  ? nutrition supplement (JUVEN)  1 packet Oral BID BM  ? sodium chloride flush  3 mL Intravenous Q12H  ? ? ? Data Reviewed:  ? ?CBG: ? ?No results for input(s): GLUCAP in the last 168 hours. ? ?SpO2: 100 %  ? ? ?Vitals:  ? 08/14/21 1500 08/14/21 2051 08/15/21 0500 08/15/21 0514  ?BP: 105/63 (!) 118/59  112/78  ?Pulse: 86 90  86  ?Resp: 16 18  16   ?Temp: 97.7 ?F (36.5 ?C) 98.5 ?F (36.9 ?C)  97.6 ?F (36.4 ?C)  ?TempSrc: Oral Oral  Oral  ?SpO2: 97% 99%  100%  ?Weight:   79.9 kg   ?Height:      ? ? ? ? ?Data Reviewed: ? ?Basic Metabolic Panel: ?Recent Labs  ?Lab 08/08/21 ?2100 08/09/21 ?0019 08/09/21 ?0930 08/09/21 ?1621 08/10/21 ?0258 08/10/21 ?2919 08/10/21 ?0806 08/10/21 ?1133 08/11/21 ?0255 08/13/21 ?0300  ?NA 126*   < > 126*   < > 132* 132* 133* 134* 136 140  ?K 6.8*   < > 5.0   < > 3.6 3.6 3.6 3.8 3.5 4.3  ?CL 96*   < > 102   < > 108 109 110 112* 112* 113*  ?CO2 20*   < > 16*   < > 16* 15* 15* 16* 18* 23  ?GLUCOSE 101*   < > 92   < > 81 75 78 78 101* 115*  ?BUN 117*   < > 94*   < >  64* 63* 57* 56* 49* 47*  ?CREATININE 2.74*   < > 1.88*   < > 1.44* 1.41* 1.38* 1.34* 1.31* 1.06  ?CALCIUM 9.1   < > 8.0*   < > 8.2* 8.3* 8.2* 8.2* 8.5* 8.2*  ?MG 3.2*  --  2.3  --   --   --   --   --   --   --   ?PHOS  --    < > 3.6   < > 3.8 4.0 3.9 3.9 2.5  --   ? < > = values in this interval not displayed.  ? ? ?CBC: ?Recent Labs  ?Lab 08/08/21 ?2100 08/09/21 ?[id number] 08/13/21 ?0300  ?WBC 7.5 7.0 4.5  ?NEUTROABS 6.2  --   --   ?HGB 14.5 11.2* 10.0*  ?HCT 43.3 33.1* 31.3*  ?MCV 83.8 83.4 87.9  ?PLT 146* 130* 172  ? ? ?LFT ?Recent Labs  ?Lab 08/08/21 ?2100 08/09/21 ?0019 08/10/21 ?[id number] 08/10/21 ?[id number] 08/10/21 ?1133 08/11/21 ?0255 08/13/21 ?0300  ?AST 31  --   --   --   --   --  30  ?ALT 20  --    --   --   --   --  20  ?ALKPHOS 98  --   --   --   --   --  71  ?BILITOT 0.6  --   --   --   --   --  0.6  ?PROT 7.7  --   --   --   --   --  5.2*  ?ALBUMIN 3.2*   < > 2.3* 2.2* 2.2* 2.3* 2.0*  ? < > = values in this interval not displayed.  ? ?  ?Antibiotics: ?Anti-infectives (From admission, onward)  ? ? Start     Dose/Rate Route Frequency Ordered Stop  ? 08/15/21 1145  amoxicillin-clavulanate (AUGMENTIN) 875-125 MG per tablet 1 tablet       ? 1 tablet Oral Every 12 hours 08/15/21 1053 08/25/21 0959  ? 08/10/21 1100  Ampicillin-Sulbactam (UNASYN) 3 g in sodium chloride 0.9 % 100 mL IVPB  Status:  Discontinued       ? 3 g ?200 mL/hr over 30 Minutes Intravenous Every 6 hours 08/10/21 1002 08/15/21 1053  ? 08/10/21 0200  ceFEPIme (MAXIPIME) 2 g in sodium chloride 0.9 % 100 mL IVPB  Status:  Discontinued       ? 2 g ?200 mL/hr over 30 Minutes Intravenous Every 24 hours 08/09/21 0015 08/10/21 1002  ? 08/09/21 2000  DAPTOmycin (CUBICIN) 600 mg in sodium chloride 0.9 % IVPB  Status:  Discontinued       ? 8 mg/kg ? 74.8 kg ?124 mL/hr over 30 Minutes Intravenous Daily 08/09/21 1459 08/10/21 1002  ? 08/09/21 0015  vancomycin variable dose per unstable renal function (pharmacist dosing)  Status:  Discontinued       ?  Does not apply See admin instructions 08/09/21 0015 08/09/21 1459  ? 08/08/21 2330  ceFEPIme (MAXIPIME) 2 g in sodium chloride 0.9 % 100 mL IVPB       ? 2 g ?200 mL/hr over 30 Minutes Intravenous  Once 08/08/21 2328 08/09/21 0453  ? 08/08/21 2200  vancomycin (VANCOREADY) IVPB 1500 mg/300 mL       ? 1,500 mg ?150 mL/hr over 120 Minutes Intravenous  Once 08/08/21 2150 08/09/21 0226  ? 08/08/21 2145  piperacillin-tazobactam (ZOSYN) IVPB 3.375 g  Status:  Discontinued       ? 3.375  g ?100 mL/hr over 30 Minutes Intravenous  Once 08/08/21 2140 08/08/21 2324  ? ?  ? ? ? ?DVT prophylaxis: Apixaban ? ?Code Status: Full code ? ?Family Communication: No family at bedside ? ? ?CONSULTS  ? ? ?Objective  ? ? ?Physical  Examination: ? ? ?General-appears in no acute distress ?Heart-S1-S2, regular, no murmur auscultated ?Lungs-clear to auscultation bilaterally, no wheezing or crackles auscultated ?Abdomen-soft, nontender, no organomegaly ?Extremities-bilateral lower extremities in dressing  ?Neuro-alert, oriented x3, no focal deficit noted ? ? ?Status is: Inpatient:   ? ? ?  ? ?Oswald Hillock ?  ?[facility] ?If 7PM-7AM, please contact night-coverage at [email or web address], ?Office  [phone] ? ? ?08/15/2021, 3:39 PM  LOS: 6 days  ? ? ? ? ? ? ? ? ? ? ?  ?

## 2021-08-15 NOTE — Progress Notes (Signed)
? ? ? ? ? ? ?Subjective: ?Clinically mentating at baseline ?Doing well on amp-sulbactam ?Significant open wound still especially left LE ?No n/v/diarrhea ?Eating well ?No f/c ? ?Ortho had evaluated and will f/u outpatient with Dr Lajoyce Corners ? ?ROS: ?All other ros negative ? ? ?Antibiotics:  ?Anti-infectives (From admission, onward)  ? ? Start     Dose/Rate Route Frequency Ordered Stop  ? 08/15/21 1145  amoxicillin-clavulanate (AUGMENTIN) 875-125 MG per tablet 1 tablet       ? 1 tablet Oral Every 12 hours 08/15/21 1053 08/25/21 0959  ? 08/10/21 1100  Ampicillin-Sulbactam (UNASYN) 3 g in sodium chloride 0.9 % 100 mL IVPB  Status:  Discontinued       ? 3 g ?200 mL/hr over 30 Minutes Intravenous Every 6 hours 08/10/21 1002 08/15/21 1053  ? 08/10/21 0200  ceFEPIme (MAXIPIME) 2 g in sodium chloride 0.9 % 100 mL IVPB  Status:  Discontinued       ? 2 g ?200 mL/hr over 30 Minutes Intravenous Every 24 hours 08/09/21 0015 08/10/21 1002  ? 08/09/21 2000  DAPTOmycin (CUBICIN) 600 mg in sodium chloride 0.9 % IVPB  Status:  Discontinued       ? 8 mg/kg ? 74.8 kg ?124 mL/hr over 30 Minutes Intravenous Daily 08/09/21 1459 08/10/21 1002  ? 08/09/21 0015  vancomycin variable dose per unstable renal function (pharmacist dosing)  Status:  Discontinued       ?  Does not apply See admin instructions 08/09/21 0015 08/09/21 1459  ? 08/08/21 2330  ceFEPIme (MAXIPIME) 2 g in sodium chloride 0.9 % 100 mL IVPB       ? 2 g ?200 mL/hr over 30 Minutes Intravenous  Once 08/08/21 2328 08/09/21 0453  ? 08/08/21 2200  vancomycin (VANCOREADY) IVPB 1500 mg/300 mL       ? 1,500 mg ?150 mL/hr over 120 Minutes Intravenous  Once 08/08/21 2150 08/09/21 0226  ? 08/08/21 2145  piperacillin-tazobactam (ZOSYN) IVPB 3.375 g  Status:  Discontinued       ? 3.375 g ?100 mL/hr over 30 Minutes Intravenous  Once 08/08/21 2140 08/08/21 2324  ? ?  ? ? ?Medications: ?Scheduled Meds: ? amoxicillin-clavulanate  1 tablet Oral Q12H  ? apixaban  5 mg Oral BID  ? feeding supplement   237 mL Oral BID BM  ? leptospermum manuka honey  1 application. Topical Daily  ? nutrition supplement (JUVEN)  1 packet Oral BID BM  ? sodium chloride flush  3 mL Intravenous Q12H  ? ?Continuous Infusions: ? ? ?PRN Meds:.acetaminophen **OR** acetaminophen, LORazepam, morphine injection, polyethylene glycol ? ? ? ?Objective: ?Weight change:  ? ?Intake/Output Summary (Last 24 hours) at 08/15/2021 1547 ?Last data filed at 08/15/2021 0515 ?Gross per 24 hour  ?Intake 240 ml  ?Output 750 ml  ?Net -510 ml  ? ? ?Blood pressure 112/78, pulse 86, temperature 97.6 ?F (36.4 ?C), temperature source Oral, resp. rate 16, height 5\' 11"  (1.803 m), weight 79.9 kg, SpO2 100 %. ?Temp:  [97.6 ?F (36.4 ?C)-98.5 ?F (36.9 ?C)] 97.6 ?F (36.4 ?C) (04/17 07-28-1999) ?Pulse Rate:  [86-90] 86 (04/17 0514) ?Resp:  [16-18] 16 (04/17 0514) ?BP: (112-118)/(59-78) 112/78 (04/17 07-28-1999) ?SpO2:  [99 %-100 %] 100 % (04/17 0514) ?Weight:  [79.9 kg] 79.9 kg (04/17 0500) ? ?Physical Exam: ?General/constitutional: no distress, pleasant ?HEENT: Normocephalic, PER, Conj Clear, EOMI, Oropharynx clear ?Neck supple ?CV: rrr no mrg ?Lungs: clear to auscultation, normal respiratory effort ?Abd: Soft, Nontender ?Ext: no edema ?Skin/msk: chronically  poorly kept bilateral LE hygiene with long nails and several with thickening/discoloration; skin chronic brawny edema and heavy crusting; bilateral LE in dressing otherwise ? ? ?CBC: ?Lab Results  ?Component Value Date  ? WBC 4.5 08/13/2021  ? HGB 10.0 (L) 08/13/2021  ? HCT 31.3 (L) 08/13/2021  ? MCV 87.9 08/13/2021  ? PLT 172 08/13/2021  ? ? ? ? ?BMET ?Recent Labs  ?  08/13/21 ?0300  ?NA 140  ?K 4.3  ?CL 113*  ?CO2 23  ?GLUCOSE 115*  ?BUN 47*  ?CREATININE 1.06  ?CALCIUM 8.2*  ? ? ? ? ?Liver Panel ? ?Recent Labs  ?  08/13/21 ?0300  ?PROT 5.2*  ?ALBUMIN 2.0*  ?AST 30  ?ALT 20  ?ALKPHOS 71  ?BILITOT 0.6  ? ? ? ? ? ? ?Sedimentation Rate ?No results for input(s): ESRSEDRATE in the last 72 hours. ? ?C-Reactive Protein ?No results for  input(s): CRP in the last 72 hours. ? ? ?Micro Results: ?Recent Results (from the past 720 hour(s))  ?Blood culture (routine x 2)     Status: None  ? Collection Time: 08/08/21  9:00 PM  ? Specimen: BLOOD  ?Result Value Ref Range Status  ? Specimen Description   Final  ?  BLOOD BLOOD LEFT FOREARM ?Performed at [facility], [address] [place], [place] [address] ?  ? Special Requests   Final  ?  BOTTLES DRAWN AEROBIC AND ANAEROBIC Blood Culture adequate volume ?Performed at [facility], [address] [place], [place] [address] ?  ? Culture   Final  ?  NO GROWTH 5 DAYS ?Performed at [facility], [address] [place], [place] [address] ?  ? Report Status 08/13/2021 FINAL  Final  ?Blood culture (routine x 2)     Status: None  ? Collection Time: 08/08/21  9:00 PM  ? Specimen: BLOOD  ?Result Value Ref Range Status  ? Specimen Description   Final  ?  BLOOD BLOOD RIGHT HAND ?Performed at [facility], [address] [place], [place] [address] ?  ? Special Requests   Final  ?  BOTTLES DRAWN AEROBIC AND ANAEROBIC Blood Culture adequate volume ?Performed at [facility], [address] [place], [place] [address] ?  ? Culture   Final  ?  NO GROWTH 5 DAYS ?Performed at [facility], [address] [place], [place] [address] ?  ? Report Status 08/13/2021 FINAL  Final  ?Resp Panel by RT-PCR (Flu A&B, Covid) Nasopharyngeal Swab     Status: None  ? Collection Time: 08/09/21 12:19 AM  ? Specimen: Nasopharyngeal Swab; Nasopharyngeal(NP) swabs in vial transport medium  ?Result Value Ref Range Status  ? SARS Coronavirus 2 by RT PCR NEGATIVE NEGATIVE Final  ?  Comment: (NOTE) ?SARS-CoV-2 target nucleic acids are NOT DETECTED. ? ?The SARS-CoV-2 RNA is generally detectable in upper respiratory ?specimens during the acute phase of infection. The lowest ?concentration of SARS-CoV-2 viral copies this assay can detect is ?138 copies/mL. A  negative result does not preclude SARS-Cov-2 ?infection and should not be used as the sole basis for treatment or ?other patient management decisions. A negative result may occur with  ?improper specimen collection/handling, submission of specimen other ?than nasopharyngeal swab, presence of viral mutation(s) within the ?areas targeted by this assay, and inadequate number of viral ?copies(<138 copies/mL). A negative result must be combined with ?clinical observations, patient history, and epidemiological ?information. The expected result is Negative. ? ?  Fact Sheet for Patients:  ?BloggerCourse.com ? ?Fact Sheet for Healthcare Providers:  ?SeriousBroker.it ? ?This test is no t yet approved or cleared by the [place] FDA and  ?has been authorized for detection and/or diagnosis of SARS-CoV-2 by ?FDA under an Emergency Use Authorization (EUA). This EUA will remain  ?in effect (meaning this test can be used) for the duration of the ?COVID-19 declaration under Section 564(b)(1) of the Act, 21 ?U.S.C.section 360bbb-3(b)(1), unless the authorization is terminated  ?or revoked sooner.  ? ? ?  ? Influenza A by PCR NEGATIVE NEGATIVE Final  ? Influenza B by PCR NEGATIVE NEGATIVE Final  ?  Comment: (NOTE) ?The Xpert Xpress SARS-CoV-2/FLU/RSV plus assay is intended as an aid ?in the diagnosis of influenza from Nasopharyngeal swab specimens and ?should not be used as a sole basis for treatment. Nasal washings and ?aspirates are unacceptable for Xpert Xpress SARS-CoV-2/FLU/RSV ?testing. ? ?Fact Sheet for Patients: ?BloggerCourse.com ? ?Fact Sheet for Healthcare Providers: ?SeriousBroker.it ? ?This test is not yet approved or cleared by the [place] FDA and ?has been authorized for detection and/or diagnosis of SARS-CoV-2 by ?FDA under an Emergency Use Authorization (EUA). This EUA will remain ?in effect (meaning this test can  be used) for the duration of the ?COVID-19 declaration under Section 564(b)(1) of the Act, 21 U.S.C. ?section 360bbb-3(b)(1), unless the authorization is terminated or ?revoked. ? ?Performed at [facility]

## 2021-08-15 NOTE — [place] FL2 (Signed)
?[place] MEDICAID FL2 LEVEL OF CARE SCREENING TOOL  ?  ? ?IDENTIFICATION  ?Patient Name: ?[name] [name] Birthdate: Feb 15, 1941 Sex: male Admission Date (Current Location): ?08/08/2021  ?[place]: ? [place] ?  Facility and Address:  ?[facility],  [address], [place] [address] ?     Provider Number: ?[id number]  ?Attending Physician Name and Address:  ?Meredeth Ide, MD ? Relative Name and Phone Number:  ?daughter, Fatih Stalvey [phone] ?   ?Current Level of Care: ?Hospital Recommended Level of Care: ?Skilled Nursing Facility Prior Approval Number: ?  ? ?Date Approved/Denied: ?  PASRR Number: ?pending ? ?Discharge Plan: ?SNF ?  ? ?Current Diagnoses: ?Patient Active Problem List  ? Diagnosis Date Noted  ? Malnutrition of moderate degree 08/12/2021  ? Adjustment disorder with depressed mood   ? Renal failure 08/09/2021  ? Cellulitis   ? Sepsis with acute organ dysfunction without septic shock (HCC)   ? Moderate dementia with psychotic disturbance (HCC)   ? Acute renal failure (HCC) 08/08/2021  ? Hyperkalemia 08/08/2021  ? Hypermagnesemia 08/08/2021  ? Uremia 08/08/2021  ? Hyponatremia 08/08/2021  ? Coronary artery disease involving native coronary artery of native heart without angina pectoris 09/04/2016  ? Persistent atrial fibrillation (HCC) 09/04/2016  ? Hyperlipidemia   ? ? ?Orientation RESPIRATION BLADDER Height & Weight   ?  ?Self, Time, Situation, Place ? Normal Continent, External catheter Weight: 176 lb 2.4 oz (79.9 kg) ?Height:  5\' 11"  (180.3 cm)  ?BEHAVIORAL SYMPTOMS/MOOD NEUROLOGICAL BOWEL NUTRITION STATUS  ?    Incontinent    ?AMBULATORY STATUS COMMUNICATION OF NEEDS Skin   ?Limited Assist Verbally Other (Comment) (Venous stasis ulcer Pretibial Left;Lower;Upper;Right) ?  ?  ?  ?    ?     ?     ? ? ?Personal Care Assistance Level of Assistance  ?Bathing, Dressing Bathing Assistance: Limited assistance ?  ?Dressing Assistance: Limited assistance ?   ? ?Functional  Limitations Info  ?    ?  ?   ? ? ?SPECIAL CARE FACTORS FREQUENCY  ?PT (By licensed PT), OT (By licensed OT)   ?  ?PT Frequency: 5x/wk ?OT Frequency: 5x/wk ?  ?  ?  ?   ? ? ?Contractures Contractures Info: Not present  ? ? ?Additional Factors Info  ?Code Status, Allergies Code Status Info: Full ?Allergies Info: NKDA ?  ?  ?  ?   ? ?Current Medications (08/15/2021):  This is the current hospital active medication list ?Current Facility-Administered Medications  ?Medication Dose Route Frequency Provider Last Rate Last Admin  ? acetaminophen (TYLENOL) tablet 650 mg  650 mg Oral Q6H PRN 08/17/2021, MD   650 mg at 08/12/21 1814  ? Or  ? acetaminophen (TYLENOL) suppository 650 mg  650 mg Rectal Q6H PRN 08/14/21, MD      ? amoxicillin-clavulanate (AUGMENTIN) 875-125 MG per tablet 1 tablet  1 tablet Oral Q12H Vu, Trung T, MD      ? apixaban (ELIQUIS) tablet 5 mg  5 mg Oral BID 06-08-1984, MD   5 mg at 08/14/21 2151  ? feeding supplement (ENSURE ENLIVE / ENSURE PLUS) liquid 237 mL  237 mL Oral BID BM 2152, MD   237 mL at 08/14/21 2151  ? leptospermum manuka honey (MEDIHONEY) paste 1 application.  1 application. Topical Daily 2152, MD   1 application. at 08/14/21 1330  ? LORazepam (ATIVAN) tablet 0.5 mg  0.5  mg Oral Once PRN Meredeth Ide, MD      ? morphine (PF) 2 MG/ML injection 1 mg  1 mg Intravenous Q6H PRN Sharl Ma, Sarina Ill, MD      ? nutrition supplement (JUVEN) (JUVEN) powder packet 1 packet  1 packet Oral BID BM Meredeth Ide, MD   1 packet at 08/14/21 1738  ? polyethylene glycol (MIRALAX / GLYCOLAX) packet 17 g  17 g Oral Daily PRN Meredeth Ide, MD      ? sodium chloride flush (NS) 0.9 % injection 3 mL  3 mL Intravenous Q12H Meredeth Ide, MD   3 mL at 08/13/21 1026  ? ? ? ?Discharge Medications: ?Please see discharge summary for a list of discharge medications. ? ?Relevant Imaging Results: ? ?Relevant Lab Results: ? ? ?Additional Information ?SS# [id number] ? ?Daiwik Buffalo, LCSW ? ? ? ? ?

## 2021-08-15 NOTE — Progress Notes (Signed)
Chaplain engaged in an initial visit with [name] [name] and his eldest daughter.  They were interested in completing Advanced Directive (AD), Healthcare POA documents.  Chaplain provided education on document and process.  Chaplain was later called back to have those documents notarized.  Chaplain organized for notary and witnesses to complete AD. Chaplain gave them the original copy plus two other copies back and input one copy in [name]'s medical chart.  ? ?Chaplain provided education and support.  ? ? ? 08/15/21 1400  ?Clinical Encounter Type  ?Visited With Patient and family together  ?Visit Type Initial;Social support  ?Referral From Family  ?Consult/Referral To Chaplain  ?Spiritual Encounters  ?Spiritual Needs Literature;Brochure  ? ? ?

## 2021-08-16 DIAGNOSIS — L0390 Cellulitis, unspecified: Secondary | ICD-10-CM | POA: Diagnosis not present

## 2021-08-16 DIAGNOSIS — N179 Acute kidney failure, unspecified: Secondary | ICD-10-CM | POA: Diagnosis not present

## 2021-08-16 DIAGNOSIS — M86271 Subacute osteomyelitis, right ankle and foot: Secondary | ICD-10-CM | POA: Diagnosis not present

## 2021-08-16 DIAGNOSIS — I4819 Other persistent atrial fibrillation: Secondary | ICD-10-CM | POA: Diagnosis not present

## 2021-08-16 DIAGNOSIS — I872 Venous insufficiency (chronic) (peripheral): Secondary | ICD-10-CM | POA: Diagnosis not present

## 2021-08-16 NOTE — TOC Progression Note (Signed)
Transition of Care (TOC) - Progression Note  ? ? ?Patient Details  ?Name: [name] [name] ?MRN: [id number] ?Date of Birth: [birth year] ? ?Transition of Care (TOC) CM/SW Contact  ?Dekendrick Uzelac, LCSW ?Phone Number: ?08/16/2021, 1:05 PM ? ?Clinical Narrative:    ?SNF bed search initiated. ? ? ?Expected Discharge Plan: Skilled Nursing Facility ?Barriers to Discharge: Continued Medical Work up ? ?Expected Discharge Plan and Services ?Expected Discharge Plan: Skilled Nursing Facility ?  ?  ?  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                ?  ?  ?  ?  ?  ?  ?  ?  ?  ?  ? ? ?Social Determinants of Health (SDOH) Interventions ?  ? ?Readmission Risk Interventions ?   ? View : No data to display.  ?  ?  ?  ? ? ?

## 2021-08-16 NOTE — Progress Notes (Signed)
? ? ? ? ? ? ?Subjective: ?No complaint ?No n/v/diarrhea ?No f/c ? ?Ortho had evaluated and will f/u outpatient with Dr Lajoyce Corners ? ?ROS: ?All other ros negative ? ? ?Antibiotics:  ?Anti-infectives (From admission, onward)  ? ? Start     Dose/Rate Route Frequency Ordered Stop  ? 08/15/21 1145  amoxicillin-clavulanate (AUGMENTIN) 875-125 MG per tablet 1 tablet       ? 1 tablet Oral Every 12 hours 08/15/21 1053 08/25/21 0959  ? 08/10/21 1100  Ampicillin-Sulbactam (UNASYN) 3 g in sodium chloride 0.9 % 100 mL IVPB  Status:  Discontinued       ? 3 g ?200 mL/hr over 30 Minutes Intravenous Every 6 hours 08/10/21 1002 08/15/21 1053  ? 08/10/21 0200  ceFEPIme (MAXIPIME) 2 g in sodium chloride 0.9 % 100 mL IVPB  Status:  Discontinued       ? 2 g ?200 mL/hr over 30 Minutes Intravenous Every 24 hours 08/09/21 0015 08/10/21 1002  ? 08/09/21 2000  DAPTOmycin (CUBICIN) 600 mg in sodium chloride 0.9 % IVPB  Status:  Discontinued       ? 8 mg/kg ? 74.8 kg ?124 mL/hr over 30 Minutes Intravenous Daily 08/09/21 1459 08/10/21 1002  ? 08/09/21 0015  vancomycin variable dose per unstable renal function (pharmacist dosing)  Status:  Discontinued       ?  Does not apply See admin instructions 08/09/21 0015 08/09/21 1459  ? 08/08/21 2330  ceFEPIme (MAXIPIME) 2 g in sodium chloride 0.9 % 100 mL IVPB       ? 2 g ?200 mL/hr over 30 Minutes Intravenous  Once 08/08/21 2328 08/09/21 0453  ? 08/08/21 2200  vancomycin (VANCOREADY) IVPB 1500 mg/300 mL       ? 1,500 mg ?150 mL/hr over 120 Minutes Intravenous  Once 08/08/21 2150 08/09/21 0226  ? 08/08/21 2145  piperacillin-tazobactam (ZOSYN) IVPB 3.375 g  Status:  Discontinued       ? 3.375 g ?100 mL/hr over 30 Minutes Intravenous  Once 08/08/21 2140 08/08/21 2324  ? ?  ? ? ?Medications: ?Scheduled Meds: ? amoxicillin-clavulanate  1 tablet Oral Q12H  ? apixaban  5 mg Oral BID  ? feeding supplement  237 mL Oral BID BM  ? leptospermum manuka honey  1 application. Topical Daily  ? nutrition supplement (JUVEN)   1 packet Oral BID BM  ? sodium chloride flush  3 mL Intravenous Q12H  ? ?Continuous Infusions: ? ? ?PRN Meds:.acetaminophen **OR** acetaminophen, LORazepam, morphine injection, polyethylene glycol ? ? ? ?Objective: ?Weight change:  ? ?Intake/Output Summary (Last 24 hours) at 08/16/2021 1538 ?Last data filed at 08/16/2021 0825 ?Gross per 24 hour  ?Intake 720 ml  ?Output 300 ml  ?Net 420 ml  ? ?Blood pressure (!) 105/58, pulse 88, temperature 98 ?F (36.7 ?C), temperature source Oral, resp. rate 20, height 5\' 11"  (1.803 m), weight 79.9 kg, SpO2 100 %. ?Temp:  [98 ?F (36.7 ?C)-99 ?F (37.2 ?C)] 98 ?F (36.7 ?C) (04/18 1249) ?Pulse Rate:  [88-91] 88 (04/18 1249) ?Resp:  [14-20] 20 (04/18 1249) ?BP: (103-112)/(54-76) 105/58 (04/18 1249) ?SpO2:  [99 %-100 %] 100 % (04/18 1249) ? ?Physical Exam: ?General/constitutional: no distress, pleasant ?HEENT: Normocephalic, PER, Conj Clear, EOMI, Oropharynx clear ?Neck supple ?CV: rrr no mrg ?Lungs: clear to auscultation, normal respiratory effort ?Abd: Soft, Nontender ?Neuro: nonfocal ? ?Skin/msk: chronically poorly kept bilateral LE hygiene with long nails and several with thickening/discoloration; skin chronic brawny edema and heavy crusting; bilateral LE in dressing  otherwise ? ? ?CBC: ?Lab Results  ?Component Value Date  ? WBC 4.5 08/13/2021  ? HGB 10.0 (L) 08/13/2021  ? HCT 31.3 (L) 08/13/2021  ? MCV 87.9 08/13/2021  ? PLT 172 08/13/2021  ? ? ? ? ?BMET ?Recent Labs  ?  08/15/21 ?1605  ?NA 138  ?K 3.9  ?CL 107  ?CO2 28  ?GLUCOSE 96  ?BUN 36*  ?CREATININE 1.09  ?CALCIUM 8.0*  ? ? ? ?Liver Panel ? ?No results for input(s): PROT, ALBUMIN, AST, ALT, ALKPHOS, BILITOT, BILIDIR, IBILI in the last 72 hours. ? ? ? ? ?Sedimentation Rate ?No results for input(s): ESRSEDRATE in the last 72 hours. ? ?C-Reactive Protein ?No results for input(s): CRP in the last 72 hours. ? ? ?Micro Results: ?Recent Results (from the past 720 hour(s))  ?Blood culture (routine x 2)     Status: None  ? Collection  Time: 08/08/21  9:00 PM  ? Specimen: BLOOD  ?Result Value Ref Range Status  ? Specimen Description   Final  ?  BLOOD BLOOD LEFT FOREARM ?Performed at [facility], [address] [place], [place] [address] ?  ? Special Requests   Final  ?  BOTTLES DRAWN AEROBIC AND ANAEROBIC Blood Culture adequate volume ?Performed at [facility], [address] [place], [place] [address] ?  ? Culture   Final  ?  NO GROWTH 5 DAYS ?Performed at [facility], [address] [place], [place] [address] ?  ? Report Status 08/13/2021 FINAL  Final  ?Blood culture (routine x 2)     Status: None  ? Collection Time: 08/08/21  9:00 PM  ? Specimen: BLOOD  ?Result Value Ref Range Status  ? Specimen Description   Final  ?  BLOOD BLOOD RIGHT HAND ?Performed at [facility], [address] [place], [place] [address] ?  ? Special Requests   Final  ?  BOTTLES DRAWN AEROBIC AND ANAEROBIC Blood Culture adequate volume ?Performed at [facility], [address] [place], [place] [address] ?  ? Culture   Final  ?  NO GROWTH 5 DAYS ?Performed at [facility], [address] [place], [place] [address] ?  ? Report Status 08/13/2021 FINAL  Final  ?Resp Panel by RT-PCR (Flu A&B, Covid) Nasopharyngeal Swab     Status: None  ? Collection Time: 08/09/21 12:19 AM  ? Specimen: Nasopharyngeal Swab; Nasopharyngeal(NP) swabs in vial transport medium  ?Result Value Ref Range Status  ? SARS Coronavirus 2 by RT PCR NEGATIVE NEGATIVE Final  ?  Comment: (NOTE) ?SARS-CoV-2 target nucleic acids are NOT DETECTED. ? ?The SARS-CoV-2 RNA is generally detectable in upper respiratory ?specimens during the acute phase of infection. The lowest ?concentration of SARS-CoV-2 viral copies this assay can detect is ?138 copies/mL. A negative result does not preclude SARS-Cov-2 ?infection and should not be used as the sole basis for treatment or ?other patient management decisions. A negative  result may occur with  ?improper specimen collection/handling, submission of specimen other ?than nasopharyngeal swab, presence of viral mutation(s) within the ?areas targeted by this assay, and inadequate number of viral ?copies(<138 copies/mL). A negative result must be combined with ?clinical observations, patient history, and epidemiological ?information. The expected result is Negative. ? ?Fact Sheet for Patients:  ?BloggerCourse.comhttps://www.fda.gov/media/152166/download ? ?Fact Sheet for Healthcare Providers:  ?SeriousBroker.ithttps://www.fda.gov/media/152162/download ? ?This test is no t yet approved or cleared by the [place] FDA and  ?has been authorized for  detection and/or diagnosis of SARS-CoV-2 by ?FDA under an Emergency Use Authorization (EUA). This EUA will remain  ?in effect (meaning this test can be used) for the duration of the ?COVID-19 declaration under Section 564(b)(1) of the Act, 21 ?U.S.C.section 360bbb-3(b)(1), unless the authorization is terminated  ?or revoked sooner.  ? ? ?  ? Influenza A by PCR NEGATIVE NEGATIVE Final  ? Influenza B by PCR NEGATIVE NEGATIVE Final  ?  Comment: (NOTE) ?The Xpert Xpress SARS-CoV-2/FLU/RSV plus assay is intended as an aid ?in the diagnosis of influenza from Nasopharyngeal swab specimens and ?should not be used as a sole basis for treatment. Nasal washings and ?aspirates are unacceptable for Xpert Xpress SARS-CoV-2/FLU/RSV ?testing. ? ?Fact Sheet for Patients: ?BloggerCourse.com ? ?Fact Sheet for Healthcare Providers: ?SeriousBroker.it ? ?This test is not yet approved or cleared by the [place] FDA and ?has been authorized for detection and/or diagnosis of SARS-CoV-2 by ?FDA under an Emergency Use Authorization (EUA). This EUA will remain ?in effect (meaning this test can be used) for the duration of the ?COVID-19 declaration under Section 564(b)(1) of the Act, 21 U.S.C. ?section 360bbb-3(b)(1), unless the authorization is  terminated or ?revoked. ? ?Performed at [facility], [address] ?[place], [place] [address] ?  ?MRSA Next Gen by PCR, Nasal     Status: None  ? Collection Time: 08/09/21  8:46 PM  ? Spec

## 2021-08-16 NOTE — [place] FL2 (Signed)
? MEDICAID FL2 LEVEL OF CARE SCREENING TOOL  ?  ? ?IDENTIFICATION  ?Patient Name: ?[name] [name] Birthdate: 04/20/1941 Sex: male Admission Date (Current Location): ?08/08/2021  ?[place]: ? [place] ?  Facility and Address:  ?[facility],  [address] [place] [address], [place] [address] ?     Provider Number: ?[id number]  ?Attending Physician Name and Address:  ?Meredeth Ide, MD ? Relative Name and Phone Number:  ?daughter, Eward Rutigliano [phone] ?   ?Current Level of Care: ?Hospital Recommended Level of Care: ?Skilled Nursing Facility Prior Approval Number: ?  ? ?Date Approved/Denied: ?  PASRR Number: ?[id number] H ? ?Discharge Plan: ?SNF ?  ? ?Current Diagnoses: ?Patient Active Problem List  ? Diagnosis Date Noted  ? Venous stasis dermatitis of both lower extremities   ? Open wound   ? Malnutrition of moderate degree 08/12/2021  ? Adjustment disorder with depressed mood   ? Renal failure 08/09/2021  ? Cellulitis   ? Sepsis with acute organ dysfunction without septic shock (HCC)   ? Moderate dementia with psychotic disturbance (HCC)   ? Acute renal failure (HCC) 08/08/2021  ? Hyperkalemia 08/08/2021  ? Hypermagnesemia 08/08/2021  ? Uremia 08/08/2021  ? Hyponatremia 08/08/2021  ? Coronary artery disease involving native coronary artery of native heart without angina pectoris 09/04/2016  ? Persistent atrial fibrillation (HCC) 09/04/2016  ? Hyperlipidemia   ? ? ?Orientation RESPIRATION BLADDER Height & Weight   ?  ?Self, Time, Situation, Place ? Normal Continent, External catheter Weight: 176 lb 2.4 oz (79.9 kg) ?Height:  5\' 11"  (180.3 cm)  ?BEHAVIORAL SYMPTOMS/MOOD NEUROLOGICAL BOWEL NUTRITION STATUS  ?    Incontinent    ?AMBULATORY STATUS COMMUNICATION OF NEEDS Skin   ?Limited Assist Verbally Other (Comment) (Venous stasis ulcer Pretibial Left;Lower;Upper;Right) ?  ?  ?  ?    ?     ?     ? ? ?Personal Care Assistance Level of Assistance  ?Bathing, Dressing Bathing Assistance: Limited  assistance ?  ?Dressing Assistance: Limited assistance ?   ? ?Functional Limitations Info  ?    ?  ?   ? ? ?SPECIAL CARE FACTORS FREQUENCY  ?PT (By licensed PT), OT (By licensed OT)   ?  ?PT Frequency: 5x/wk ?OT Frequency: 5x/wk ?  ?  ?  ?   ? ? ?Contractures Contractures Info: Not present  ? ? ?Additional Factors Info  ?Code Status, Allergies Code Status Info: Full ?Allergies Info: NKDA ?  ?  ?  ?   ? ?Current Medications (08/16/2021):  This is the current hospital active medication list ?Current Facility-Administered Medications  ?Medication Dose Route Frequency Provider Last Rate Last Admin  ? acetaminophen (TYLENOL) tablet 650 mg  650 mg Oral Q6H PRN 08/18/2021, MD   650 mg at 08/12/21 1814  ? Or  ? acetaminophen (TYLENOL) suppository 650 mg  650 mg Rectal Q6H PRN 08/14/21, MD      ? amoxicillin-clavulanate (AUGMENTIN) 875-125 MG per tablet 1 tablet  1 tablet Oral Q12H Vu, Meredeth Ide, MD   1 tablet at 08/16/21 1046  ? apixaban (ELIQUIS) tablet 5 mg  5 mg Oral BID 08/18/21, MD   5 mg at 08/16/21 1046  ? feeding supplement (ENSURE ENLIVE / ENSURE PLUS) liquid 237 mL  237 mL Oral BID BM 08/18/21, MD   237 mL at 08/15/21 2042  ? leptospermum manuka honey (MEDIHONEY) paste 1 application.  1 application. Topical Daily 2043, Gagan  S, MD   1 application. at 08/14/21 1330  ? LORazepam (ATIVAN) tablet 0.5 mg  0.5 mg Oral Once PRN Meredeth Ide, MD      ? morphine (PF) 2 MG/ML injection 1 mg  1 mg Intravenous Q6H PRN Sharl Ma, Sarina Ill, MD      ? nutrition supplement (JUVEN) (JUVEN) powder packet 1 packet  1 packet Oral BID BM Meredeth Ide, MD   1 packet at 08/16/21 1046  ? polyethylene glycol (MIRALAX / GLYCOLAX) packet 17 g  17 g Oral Daily PRN Meredeth Ide, MD      ? sodium chloride flush (NS) 0.9 % injection 3 mL  3 mL Intravenous Q12H Meredeth Ide, MD   3 mL at 08/16/21 1046  ? ? ? ?Discharge Medications: ?Please see discharge summary for a list of discharge medications. ? ?Relevant Imaging  Results: ? ?Relevant Lab Results: ? ? ?Additional Information ?SS# [id number] ? ?Salena Ortlieb, LCSW ? ? ? ? ?

## 2021-08-16 NOTE — Care Management Important Message (Signed)
Important Message ? ?Patient Details IM Letter placed in Patients room. ?Name: [name] [name] ?MRN: [id number] ?Date of Birth: [birth year] ? ? ?Medicare Important Message Given:  Yes ? ? ? ? ?Caren Macadam ?08/16/2021, 2:19 PM ?

## 2021-08-16 NOTE — Evaluation (Signed)
Physical Therapy Evaluation ?Patient Details ?Name: [name] [name] ?MRN: [id number] ?DOB: 1940-06-02 ?Today's Date: 08/16/2021 ? ?History of Present Illness ? 81 yo male admitted with acute renal failure, FTT, possible sepsis, possible osteomyelitis R foot. Hx of Afib, CAD, venous stasis, retinopathy  ?Clinical Impression ? On eval, pt required Min A for mobility. He was able to stand and take a few side steps along the bedside with use of a RW. Pt presents with general weakness, decreased activity tolerance, and impaired gait and balance. Mobility was limited by weakness and drainage from LE wounds. Total assist for hygiene 2* bowel incontinence during session. Discussed d/c plan-PT recommendation is for [facility] SNF rehab. At baseline, pt was ambulatory without a device. Will plan to follow and progress activity as tolerated.    ?   ? ?Recommendations for follow up therapy are one component of a multi-disciplinary discharge planning process, led by the attending physician.  Recommendations may be updated based on patient status, additional functional criteria and insurance authorization. ? ?Follow Up Recommendations Skilled nursing-short term rehab (<3 hours/day) ? ?  ?Assistance Recommended at Discharge Frequent or constant Supervision/Assistance  ?Patient can return home with the following ? Assistance with cooking/housework;A lot of help with bathing/dressing/bathroom;A lot of help with walking and/or transfers;Help with stairs or ramp for entrance ? ?  ?Equipment Recommendations Rolling walker (2 wheels)  ?Recommendations for Other Services ?    ?  ?Functional Status Assessment Patient has had a recent decline in their functional status and demonstrates the ability to make significant improvements in function in a reasonable and predictable amount of time.  ? ?  ?Precautions / Restrictions Precautions ?Precautions: Fall ?Precaution Comments: bil LE wounds with drainage ?Restrictions ?Weight Bearing Restrictions: No  ? ?   ? ?Mobility ? Bed Mobility ?Overal bed mobility: Needs Assistance ?Bed Mobility: Supine to Sit, Sit to Supine ?  ?  ?Supine to sit: Min assist, HOB elevated ?Sit to supine: Min assist ?  ?General bed mobility comments: Small amount of assist for LEs. Increased time. Cues provided. ?  ? ?Transfers ?Overall transfer level: Needs assistance ?Equipment used: Rolling walker (2 wheels) ?Transfers: Sit to/from Stand ?Sit to Stand: From elevated surface, Min assist ?  ?  ?  ?  ?  ?General transfer comment: Assist to power up, stabilize, control descent. Cues for safety, technique, hand placement. Unsteady. ?  ? ?Ambulation/Gait ?Ambulation/Gait assistance: Min assist ?  ?Assistive device: Rolling walker (2 wheels) ?  ?  ?  ?  ?General Gait Details: Side steps along bedside with RW. Assist to steady pt. Deferred pivot/ambulation 2* bloody drainage from L LE. ? ?Stairs ?  ?  ?  ?  ?  ? ?Wheelchair Mobility ?  ? ?Modified Rankin (Stroke Patients Only) ?  ? ?  ? ?Balance Overall balance assessment: Needs assistance ?  ?  ?  ?  ?Standing balance support: Bilateral upper extremity supported ?Standing balance-Leahy Scale: Poor ?  ?  ?  ?  ?  ?  ?  ?  ?  ?  ?  ?  ?   ? ? ? ?Pertinent Vitals/Pain Pain Assessment ?Pain Assessment: No/denies pain  ? ? ?Home Living Family/patient expects to be discharged to:: Unsure ?Living Arrangements: Alone ?  ?  ?  ?  ?  ?  ?  ?Home Equipment: None ?   ?  ?Prior Function Prior Level of Function : Independent/Modified Independent ?  ?  ?  ?  ?  ?  ?  ?  ?  ? ? ?  Hand Dominance  ?   ? ?  ?Extremity/Trunk Assessment  ? Upper Extremity Assessment ?Upper Extremity Assessment: Generalized weakness ?  ? ?Lower Extremity Assessment ?Lower Extremity Assessment: Generalized weakness ?  ? ?Cervical / Trunk Assessment ?Cervical / Trunk Assessment: Normal  ?Communication  ? Communication: No difficulties  ?Cognition Arousal/Alertness: Awake/alert ?Behavior During Therapy: [facility] for tasks  assessed/performed ?Overall Cognitive Status: Impaired/Different from baseline ?Area of Impairment: Memory, Problem solving ?  ?  ?  ?  ?  ?  ?  ?  ?  ?  ?Memory: Decreased short-term memory ?  ?  ?  ?Problem Solving: Requires verbal cues ?  ?  ?  ? ?  ?General Comments   ? ?  ?Exercises    ? ?Assessment/Plan  ?  ?PT Assessment Patient needs continued PT services  ?PT Problem List Decreased strength;Decreased mobility;Decreased activity tolerance;Decreased balance;Decreased knowledge of use of DME;Decreased skin integrity ? ?   ?  ?PT Treatment Interventions DME instruction;Gait training;Therapeutic activities;Therapeutic exercise;Patient/family education;Functional mobility training;Balance training   ? ?PT Goals (Current goals can be found in the Care Plan section)  ?Acute Rehab PT Goals ?Patient Stated Goal: to regain PLOF ?PT Goal Formulation: With patient ?Time For Goal Achievement: 08/30/21 ?Potential to Achieve Goals: Good ? ?  ?Frequency Min 2X/week ?  ? ? ?Co-evaluation   ?  ?  ?  ?  ? ? ?  ?AM-PAC PT "6 Clicks" Mobility  ?Outcome Measure Help needed turning from your back to your side while in a flat bed without using bedrails?: A Little ?Help needed moving from lying on your back to sitting on the side of a flat bed without using bedrails?: A Little ?Help needed moving to and from a bed to a chair (including a wheelchair)?: A Lot ?Help needed standing up from a chair using your arms (e.g., wheelchair or bedside chair)?: A Little ?Help needed to walk in hospital room?: A Lot ?Help needed climbing 3-5 steps with a railing? : Total ?6 Click Score: 14 ? ?  ?End of Session Equipment Utilized During Treatment: Gait belt ?Activity Tolerance: Patient tolerated treatment well ?Patient left: in bed;with call bell/phone within reach;with bed alarm set ?  ?PT Visit Diagnosis: Muscle weakness (generalized) (M62.81);Difficulty in walking, not elsewhere classified (R26.2);Unsteadiness on feet (R26.81) ?  ? ?Time:  [id number] ?PT Time Calculation (min) (ACUTE ONLY): 34 min ? ? ?Charges:   PT Evaluation ?$PT Eval Moderate Complexity: 1 Mod ?PT Treatments ?$Gait Training: 8-22 mins ?  ?   ? ? ? ? ? ?Doreatha Massed, PT ?Acute Rehabilitation  ?Office: [phone] ?Pager: [phone] ? ?  ? ?

## 2021-08-16 NOTE — Progress Notes (Signed)
[facility] ? ?PROGRESS NOTE ? ?[name] [id number] DOB: 02-06-1941 DOA: 08/08/2021 ?PCP: Forrest Moron, MD ? ? ?Brief HPI:   ?81 year old male with medical history of atrial fibrillation, CAD, hyperlipidemia, venous stasis, retinopathy presented with general decline and worsening leg wounds.  Wound care was consulted, ID and vascular surgery also consulted.  MRI of the foot shows marrow edema versus artifact involving the first distal phalanx of right foot which may reflect osteomyelitis. ? ? ? ?Subjective  ? ?Patient seen and examined.  Denies pain.  ID recommends to change antibiotics to Augmentin for 1 more week. ? ? Assessment/Plan:  ? ?Sepsis ?-Sepsis physiology has resolved ?-Patient started on vancomycin, Zosyn which was transitioned to Unasyn ?-Blood cultures are negative to date ?-ID following. ? ??  Osteomyelitis ?-MRI of the right foot shows marrow edema versus artifact involving first distal phalanx of right foot which may reflect osteomyelitis ?-Called and discussed with Dr. Lorin Mercy, he reviewed the scans and have consulted Dr. Sharol Given to see as outpatient ?-ID has seen the patient and feel that MRI showed no obvious sign of osteomyelitis outside of slight uncertainty right distal phalanx and setting motion with rotation and recommend 1 more week of p.o. antibiotics and follow-up in the ID clinic as outpatient. ? ?Venous stasis ulcers ?-Vascular surgery was consulted ?-Wound care consulted ?-Recommend aggressive wound care with compression from feet to knees bilaterally ?-Once wounds are clean recommend Unna boots ?-Vascular surgery recommends that he can undergo debridement if needed, per orthopedics versus general surgery, as he has sufficient arterial perfusion to heal his wounds ?-Vascular surgery to follow-up as outpatient ? ?Acute kidney injury ?-Patient's baseline creatinine around 1, presented with creatinine of 2.74 ?-Creatinine has improved to 1.06 ? ?Atrial fibrillation ?-Patient  not on beta-blockers or calcium channel blocker due to intolerance to these medications ?-Heart rate is controlled ?-Continue Eliquis ? ?Metabolic encephalopathy ?-Resolved ?-Likely in the setting of sepsis due to venous stasis ulcers ?-?  Osteomyelitis with failure to thrive ? ? ? ?Medications ? ?  ? amoxicillin-clavulanate  1 tablet Oral Q12H  ? apixaban  5 mg Oral BID  ? feeding supplement  237 mL Oral BID BM  ? leptospermum manuka honey  1 application. Topical Daily  ? nutrition supplement (JUVEN)  1 packet Oral BID BM  ? sodium chloride flush  3 mL Intravenous Q12H  ? ? ? Data Reviewed:  ? ?CBG: ? ?No results for input(s): GLUCAP in the last 168 hours. ? ?SpO2: 100 %  ? ? ?Vitals:  ? 08/15/21 1554 08/15/21 2104 08/16/21 0510 08/16/21 1249  ?BP: 107/65 (!) 103/54 112/76 (!) 105/58  ?Pulse: 90 91 88 88  ?Resp: 14 16 16 20   ?Temp: 98.2 ?F (36.8 ?C) 99 ?F (37.2 ?C) 98.7 ?F (37.1 ?C) 98 ?F (36.7 ?C)  ?TempSrc: Oral Oral Oral Oral  ?SpO2: 99% 100% 100% 100%  ?Weight:      ?Height:      ? ? ? ? ?Data Reviewed: ? ?Basic Metabolic Panel: ?Recent Labs  ?Lab 08/10/21 ?0258 08/10/21 ?0616 08/10/21 ?0806 08/10/21 ?1133 08/11/21 ?0255 08/13/21 ?0300 08/15/21 ?1605  ?NA 132* 132* 133* 134* 136 140 138  ?K 3.6 3.6 3.6 3.8 3.5 4.3 3.9  ?CL 108 109 110 112* 112* 113* 107  ?CO2 16* 15* 15* 16* 18* 23 28  ?GLUCOSE 81 75 78 78 101* 115* 96  ?BUN 64* 63* 57* 56* 49* 47* 36*  ?CREATININE 1.44* 1.41* 1.38* 1.34* 1.31* 1.06 1.09  ?  CALCIUM 8.2* 8.3* 8.2* 8.2* 8.5* 8.2* 8.0*  ?PHOS 3.8 4.0 3.9 3.9 2.5  --   --   ? ? ?CBC: ?Recent Labs  ?Lab 08/13/21 ?0300  ?WBC 4.5  ?HGB 10.0*  ?HCT 31.3*  ?MCV 87.9  ?PLT 172  ? ? ?LFT ?Recent Labs  ?Lab 08/10/21 ?0616 08/10/21 ?0806 08/10/21 ?1133 08/11/21 ?0255 08/13/21 ?0300  ?AST  --   --   --   --  30  ?ALT  --   --   --   --  20  ?ALKPHOS  --   --   --   --  71  ?BILITOT  --   --   --   --  0.6  ?PROT  --   --   --   --  5.2*  ?ALBUMIN 2.3* 2.2* 2.2* 2.3* 2.0*  ? ?  ?Antibiotics: ?Anti-infectives  (From admission, onward)  ? ? Start     Dose/Rate Route Frequency Ordered Stop  ? 08/15/21 1145  amoxicillin-clavulanate (AUGMENTIN) 875-125 MG per tablet 1 tablet       ? 1 tablet Oral Every 12 hours 08/15/21 1053 08/25/21 0959  ? 08/10/21 1100  Ampicillin-Sulbactam (UNASYN) 3 g in sodium chloride 0.9 % 100 mL IVPB  Status:  Discontinued       ? 3 g ?200 mL/hr over 30 Minutes Intravenous Every 6 hours 08/10/21 1002 08/15/21 1053  ? 08/10/21 0200  ceFEPIme (MAXIPIME) 2 g in sodium chloride 0.9 % 100 mL IVPB  Status:  Discontinued       ? 2 g ?200 mL/hr over 30 Minutes Intravenous Every 24 hours 08/09/21 0015 08/10/21 1002  ? 08/09/21 2000  DAPTOmycin (CUBICIN) 600 mg in sodium chloride 0.9 % IVPB  Status:  Discontinued       ? 8 mg/kg ? 74.8 kg ?124 mL/hr over 30 Minutes Intravenous Daily 08/09/21 1459 08/10/21 1002  ? 08/09/21 0015  vancomycin variable dose per unstable renal function (pharmacist dosing)  Status:  Discontinued       ?  Does not apply See admin instructions 08/09/21 0015 08/09/21 1459  ? 08/08/21 2330  ceFEPIme (MAXIPIME) 2 g in sodium chloride 0.9 % 100 mL IVPB       ? 2 g ?200 mL/hr over 30 Minutes Intravenous  Once 08/08/21 2328 08/09/21 0453  ? 08/08/21 2200  vancomycin (VANCOREADY) IVPB 1500 mg/300 mL       ? 1,500 mg ?150 mL/hr over 120 Minutes Intravenous  Once 08/08/21 2150 08/09/21 0226  ? 08/08/21 2145  piperacillin-tazobactam (ZOSYN) IVPB 3.375 g  Status:  Discontinued       ? 3.375 g ?100 mL/hr over 30 Minutes Intravenous  Once 08/08/21 2140 08/08/21 2324  ? ?  ? ? ? ?DVT prophylaxis: Apixaban ? ?Code Status: Full code ? ?Family Communication: Discussed with patient's daughter at bedside ? ? ?CONSULTS  ? ? ?Objective  ? ? ?Physical Examination: ? ? ?General-appears in no acute distress ?Heart-S1-S2, regular, no murmur auscultated ?Lungs-clear to auscultation bilaterally, no wheezing or crackles auscultated ?Abdomen-soft, nontender, no organomegaly ?Extremities-bilateral lower extremities  in dressing  ?Neuro-alert, oriented x3, no focal deficit noted ? ? ?Status is: Inpatient:   ? ? ?  ? ?[name] [name] ?  ?[facility] ?If 7PM-7AM, please contact night-coverage at [email or web address], ?Office  [phone] ? ? ?08/16/2021, 2:36 PM  LOS: 7 days  ? ? ? ? ? ? ? ? ? ? ?  ?

## 2021-08-17 DIAGNOSIS — M86271 Subacute osteomyelitis, right ankle and foot: Secondary | ICD-10-CM | POA: Diagnosis not present

## 2021-08-17 DIAGNOSIS — N179 Acute kidney failure, unspecified: Secondary | ICD-10-CM | POA: Diagnosis not present

## 2021-08-17 LAB — CBC
HCT: 30.6 % — ABNORMAL LOW (ref 39.0–52.0)
Hemoglobin: 9.5 g/dL — ABNORMAL LOW (ref 13.0–17.0)
MCH: 27.8 pg (ref 26.0–34.0)
MCHC: 31 g/dL (ref 30.0–36.0)
MCV: 89.5 fL (ref 80.0–100.0)
Platelets: 181 10*3/uL (ref 150–400)
RBC: 3.42 MIL/uL — ABNORMAL LOW (ref 4.22–5.81)
RDW: 17.1 % — ABNORMAL HIGH (ref 11.5–15.5)
WBC: 6.4 10*3/uL (ref 4.0–10.5)
nRBC: 0 % (ref 0.0–0.2)

## 2021-08-17 LAB — C-REACTIVE PROTEIN: CRP: 0.7 mg/dL (ref <1.0)

## 2021-08-17 NOTE — TOC Progression Note (Signed)
Transition of Care (TOC) - Progression Note  ? ? ?Patient Details  ?Name: [name] [name] ?MRN: [id number] ?Date of Birth: [birth year] ? ?Transition of Care (TOC) CM/SW Contact  ?Cecille Po, RN ?Phone Number: ?08/17/2021, 4:27 PM ? ?Clinical Narrative:    ? ?Called patient's daughter and [name] [name] to give her the bed offers.  She is going to research the facilities and discuss it with her other family members. She has CM number and will notify CM of their choice.   ? ? ? ?Expected Discharge Plan: Skilled Nursing Facility ?Barriers to Discharge: Continued Medical Work up ? ?Expected Discharge Plan and Services ?Expected Discharge Plan: Skilled Nursing Facility ?  ?  ?  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                ?  ?  ?  ?

## 2021-08-17 NOTE — Progress Notes (Addendum)
[facility] ? ?PROGRESS NOTE ? ?Emanuel Stroble [id number] DOB: 1941/04/03 DOA: 08/08/2021 ?PCP: Forrest Moron, MD ? ? ?Brief HPI:   ?81 year old male with medical history of atrial fibrillation, CAD, hyperlipidemia, venous stasis, retinopathy presented with general decline and worsening leg wounds.  Wound care was consulted, ID and vascular surgery also consulted.  MRI of the foot shows marrow edema versus artifact involving the first distal phalanx of right foot which may reflect osteomyelitis. ? ? ? ?Subjective  ? ?Patient seen and examined, no new complaints. ? ? Assessment/Plan:  ? ?Sepsis ?-Sepsis physiology has resolved ?-Patient started on vancomycin, Zosyn which was transitioned to Unasyn ?-Blood cultures are negative to date ?-ID following. ? ??  Osteomyelitis ?-MRI of the right foot shows marrow edema versus artifact involving first distal phalanx of right foot which may reflect osteomyelitis ?-Called and discussed with Dr. Lorin Mercy, he reviewed the scans and have consulted Dr. Sharol Given to see as outpatient ?-ID has seen the patient and feel that MRI showed no obvious sign of osteomyelitis outside of slight uncertainty right distal phalanx and setting motion with rotation and recommend 1 more week of p.o. antibiotics and follow-up in the ID clinic as outpatient. ?-Patient on Augmentin till 08/25/2021 ? ?Venous stasis ulcers ?-Vascular surgery was consulted ?-Wound care consulted ?-Recommend aggressive wound care with compression from feet to knees bilaterally ?-Once wounds are clean recommend Unna boots ?-Vascular surgery recommends that he can undergo debridement if needed, per orthopedics versus general surgery, as he has sufficient arterial perfusion to heal his wounds ?-Vascular surgery to follow-up as outpatient ? ?Acute kidney injury ?-Patient's baseline creatinine around 1, presented with creatinine of 2.74 ?-Creatinine has improved to 1.06 ? ?Atrial fibrillation ?-Patient not on beta-blockers or  calcium channel blocker due to intolerance to these medications ?-Heart rate is controlled ?-Continue Eliquis ? ?Metabolic encephalopathy ?-Resolved ?-Likely in the setting of sepsis due to venous stasis ulcers ?-?  Osteomyelitis with failure to thrive ? ?Discharge planning ?Awaiting bed at skilled nursing facility, likely discharge on 08/18/2021 ? ?Medications ? ?  ? amoxicillin-clavulanate  1 tablet Oral Q12H  ? apixaban  5 mg Oral BID  ? feeding supplement  237 mL Oral BID BM  ? nutrition supplement (JUVEN)  1 packet Oral BID BM  ? sodium chloride flush  3 mL Intravenous Q12H  ? ? ? Data Reviewed:  ? ?CBG: ? ?No results for input(s): GLUCAP in the last 168 hours. ? ?SpO2: 100 %  ? ? ?Vitals:  ? 08/16/21 2030 08/17/21 0417 08/17/21 1239 08/17/21 1240  ?BP: (!) 125/52 105/68 123/66 (!) 114/53  ?Pulse: 99 88 (!) 115 89  ?Resp:  20 20   ?Temp: 98.5 ?F (36.9 ?C) 98.6 ?F (37 ?C) 98 ?F (36.7 ?C) 98 ?F (36.7 ?C)  ?TempSrc: Oral Oral Oral (P) Oral  ?SpO2: 99% 97% 99% 100%  ?Weight:      ?Height:      ? ? ? ? ?Data Reviewed: ? ?Basic Metabolic Panel: ?Recent Labs  ?Lab 08/11/21 ?0255 08/13/21 ?0300 08/15/21 ?[id number]  ?NA 136 140 138  ?K 3.5 4.3 3.9  ?CL 112* 113* 107  ?CO2 18* 23 28  ?GLUCOSE 101* 115* 96  ?BUN 49* 47* 36*  ?CREATININE 1.31* 1.06 1.09  ?CALCIUM 8.5* 8.2* 8.0*  ?PHOS 2.5  --   --   ? ? ?CBC: ?Recent Labs  ?Lab 08/13/21 ?0300 08/17/21 ?[id number]  ?WBC 4.5 6.4  ?HGB 10.0* 9.5*  ?HCT 31.3* 30.6*  ?MCV 87.9  89.5  ?PLT 172 181  ? ? ?LFT ?Recent Labs  ?Lab 08/11/21 ?0255 08/13/21 ?0300  ?AST  --  30  ?ALT  --  20  ?ALKPHOS  --  71  ?BILITOT  --  0.6  ?PROT  --  5.2*  ?ALBUMIN 2.3* 2.0*  ? ?  ?Antibiotics: ?Anti-infectives (From admission, onward)  ? ? Start     Dose/Rate Route Frequency Ordered Stop  ? 08/15/21 1145  amoxicillin-clavulanate (AUGMENTIN) 875-125 MG per tablet 1 tablet       ? 1 tablet Oral Every 12 hours 08/15/21 1053 08/25/21 0959  ? 08/10/21 1100  Ampicillin-Sulbactam (UNASYN) 3 g in sodium chloride 0.9 %  100 mL IVPB  Status:  Discontinued       ? 3 g ?200 mL/hr over 30 Minutes Intravenous Every 6 hours 08/10/21 1002 08/15/21 1053  ? 08/10/21 0200  ceFEPIme (MAXIPIME) 2 g in sodium chloride 0.9 % 100 mL IVPB  Status:  Discontinued       ? 2 g ?200 mL/hr over 30 Minutes Intravenous Every 24 hours 08/09/21 0015 08/10/21 1002  ? 08/09/21 2000  DAPTOmycin (CUBICIN) 600 mg in sodium chloride 0.9 % IVPB  Status:  Discontinued       ? 8 mg/kg ? 74.8 kg ?124 mL/hr over 30 Minutes Intravenous Daily 08/09/21 1459 08/10/21 1002  ? 08/09/21 0015  vancomycin variable dose per unstable renal function (pharmacist dosing)  Status:  Discontinued       ?  Does not apply See admin instructions 08/09/21 0015 08/09/21 1459  ? 08/08/21 2330  ceFEPIme (MAXIPIME) 2 g in sodium chloride 0.9 % 100 mL IVPB       ? 2 g ?200 mL/hr over 30 Minutes Intravenous  Once 08/08/21 2328 08/09/21 0453  ? 08/08/21 2200  vancomycin (VANCOREADY) IVPB 1500 mg/300 mL       ? 1,500 mg ?150 mL/hr over 120 Minutes Intravenous  Once 08/08/21 2150 08/09/21 0226  ? 08/08/21 2145  piperacillin-tazobactam (ZOSYN) IVPB 3.375 g  Status:  Discontinued       ? 3.375 g ?100 mL/hr over 30 Minutes Intravenous  Once 08/08/21 2140 08/08/21 2324  ? ?  ? ? ? ?DVT prophylaxis: Apixaban ? ?Code Status: Full code ? ?Family Communication: Discussed with patient's daughter at bedside ? ? ?CONSULTS  ? ? ?Objective  ? ? ?Physical Examination: ? ? ?General-appears in no acute distress ?Heart-S1-S2, regular, no murmur auscultated ?Lungs-clear to auscultation bilaterally, no wheezing or crackles auscultated ?Abdomen-soft, nontender, no organomegaly ?Extremities-dressing in bilateral lower extremities ?Neuro-alert, oriented x3, no focal deficit noted ? ? ?Status is: Inpatient:   ? ? ?  ? ?Oswald Hillock ?  ?[facility] ?If 7PM-7AM, please contact night-coverage at [email or web address], ?Office  [phone] ? ? ?08/17/2021, 2:25 PM  LOS: 8 days  ? ? ? ? ? ? ? ? ? ? ?  ?

## 2021-08-17 NOTE — Consult Note (Addendum)
WOC Nurse wound follow up ?Wound type: venous stasis with poor hygiene  ?Measurement: see nursing flow sheets ?Wound bed: ?Large area LLE, 100% clean, red, bleeding easily. Continues to have black crust over the ankle and doral foot ?Several small punched out areas on the RLE; 100% pink and clean with scattered crust and skin changes related to venous disease, could not rule out possible lymphedema based on presentation  ?Drainage (amount, consistency, odor) bloody ?Periwound: edema; see above  ?Dressing procedure/placement/frequency: ?DC Medihoney; wounds are clean ?Calcium alginate to the open wounds for exudate control and for hemostatic properties. Xeroform gauze to the crusted areas to soften.  ?Wrap from toes to knees with kerlix and ACE wraps. ?Change every other day ? ? ?Vascular and orthopedic evaluations completed both have suggested follow up as an outpatient to determine etiology and develop POC for long term management.  ? ?Consider wound care center evaulation as well.  ? ?WOC nursing team will sign off, wounds are progressing well. Would not recommend any therapeutic compression until complete vascular work up completed and LEs are improved  ? ?Re consult if needed, will not follow at this time. ?Thanks ? Tekeshia Klahr [facility] MSN, RN,CWOCN, CNS, CWON-AP [phone])  ? ? ?  ?

## 2021-08-18 DIAGNOSIS — I4819 Other persistent atrial fibrillation: Secondary | ICD-10-CM | POA: Diagnosis not present

## 2021-08-18 DIAGNOSIS — N179 Acute kidney failure, unspecified: Secondary | ICD-10-CM | POA: Diagnosis not present

## 2021-08-18 DIAGNOSIS — I872 Venous insufficiency (chronic) (peripheral): Secondary | ICD-10-CM | POA: Diagnosis not present

## 2021-08-18 DIAGNOSIS — L0390 Cellulitis, unspecified: Secondary | ICD-10-CM | POA: Diagnosis not present

## 2021-08-18 MED ORDER — APIXABAN 5 MG PO TABS
5.0000 mg | ORAL_TABLET | Freq: Two times a day (BID) | ORAL | Status: AC
Start: 2021-08-18 — End: ?

## 2021-08-18 MED ORDER — AMOXICILLIN-POT CLAVULANATE 875-125 MG PO TABS: 1.0000 | ORAL_TABLET | Freq: Two times a day (BID) | ORAL | 0 refills | Status: DC

## 2021-08-18 MED ORDER — ENSURE ENLIVE PO LIQD: 237.0000 mL | Freq: Two times a day (BID) | ORAL | 12 refills | Status: DC

## 2021-08-18 MED ORDER — JUVEN PO PACK: 1.0000 | PACK | Freq: Two times a day (BID) | ORAL | 0 refills | Status: DC

## 2021-08-18 NOTE — Discharge Summary (Signed)
?Physician Discharge Summary ?  ?Patient: [name] [name] MRN: [id number] DOB: Aug 30, 1940  ?Admit date:     08/08/2021  ?Discharge date: 08/18/21  ?Discharge Physician: Meredeth IdeGagan S Evoleth Nordmeyer  ? ?PCP: Doristine BosworthStallings, Zoe A, MD  ? ?Recommendations at discharge:  ? ?Follow-up orthopedics Dr. Lajoyce Cornersuda in 1 week ?Follow-up infectious disease clinic on 08/31/2021 ?Continue taking Augmentin 1 tablet p.o. twice daily for 7 more days, last date 08/25/2021 ? ?Discharge Diagnoses: ?Principal Problem: ?  Acute renal failure (HCC) ?Active Problems: ?  Persistent atrial fibrillation (HCC) ?  Hyperkalemia ?  Hypermagnesemia ?  Uremia ?  Hyponatremia ?  Renal failure ?  Adjustment disorder with depressed mood ?  Malnutrition of moderate degree ?  Venous stasis dermatitis of both lower extremities ?  Open wound ? ?Resolved Problems: ?  * No resolved hospital problems. * ? ?Hospital Course: ?81 year old male with medical history of atrial fibrillation, CAD, hyperlipidemia, venous stasis, retinopathy presented with general decline and worsening leg wounds.  Wound care was consulted, ID and vascular surgery also consulted.  MRI of the foot shows marrow edema versus artifact involving the first distal phalanx of right foot which may reflect osteomyelitis. ? ?Assessment and Plan: ? ?Sepsis ?-Sepsis physiology has resolved ?-Patient started on vancomycin, Zosyn which was transitioned to Unasyn ?-Blood cultures are negative to date ? ?  ?? Osteomyelitis ?-MRI of the right foot shows marrow edema versus artifact involving first distal phalanx of right foot which may reflect osteomyelitis ?-Called and discussed with Dr. Ophelia CharterYates, he reviewed the scans and have consulted Dr. Lajoyce Cornersuda to see as outpatient ?-ID has seen the patient and feel that MRI showed no obvious sign of osteomyelitis outside of slight uncertainty right distal phalanx and setting motion with rotation and recommend 1 more week of p.o. antibiotics and follow-up in the ID clinic as outpatient. ?-Patient on  Augmentin till 08/25/2021 ? ?  ?Venous stasis ulcers ?-Vascular surgery was consulted ?-Wound care consulted ?-Recommend aggressive wound care with compression from feet to knees bilaterally ?-Once wounds are clean recommend Unna boots ?-Vascular surgery recommends that he can undergo debridement if needed, per orthopedics versus general surgery, as he has sufficient arterial perfusion to heal his wounds ?-Vascular surgery to follow-up as outpatient ?  ?Acute kidney injury ?-Patient's baseline creatinine around 1, presented with creatinine of 2.74 ?-Creatinine has improved to 1.06 ?  ?Atrial fibrillation ?-Patient not on beta-blockers or calcium channel blocker due to intolerance to these medications ?-Heart rate is controlled ?-Continue Eliquis ?  ?Metabolic encephalopathy ?-Resolved ?-Likely in the setting of sepsis due to venous stasis ulcers ?-?  Osteomyelitis with failure to thrive ?  ?Discharge planning ?Patient will be discharged to skilled nursing facility today  ?  ? ?  ? ? ?Consultants: Infectious disease ?Procedures performed:  ?Disposition: Skilled nursing facility ?Diet recommendation:  ?Discharge Diet Orders (From admission, onward)  ? ?  Start     Ordered  ? 08/18/21 0000  Diet - low sodium heart healthy       ? 08/18/21 1133  ? ?  ?  ? ?  ? ?Regular diet ?DISCHARGE MEDICATION: ?Allergies as of 08/18/2021   ?No Known Allergies ?  ? ?  ?Medication List  ?  ? ?TAKE these medications   ? ?amoxicillin-clavulanate 875-125 MG tablet ?Commonly known as: AUGMENTIN ?Take 1 tablet by mouth every 12 (twelve) hours. ?  ?apixaban 5 MG Tabs tablet ?Commonly known as: Eliquis ?Take 1 tablet (5 mg total) by mouth 2 (two) times daily. ?  What changed:  ?how much to take ?how to take this ?when to take this ?additional instructions ?  ?feeding supplement Liqd ?Take 237 mLs by mouth 2 (two) times daily between meals. ?  ?nutrition supplement (JUVEN) Pack ?Take 1 packet by mouth 2 (two) times daily between meals. ?  ? ?  ? ?   ?  ? ? ?  ?Discharge Care Instructions  ?(From admission, onward)  ?  ? ? ?  ? ?  Start     Ordered  ? 08/18/21 0000  Discharge wound care:       ?Comments: Thoroughly wash BLE and feet with copious soap and water. Rinse, pat dry. ?Apply single layer of xeroform over hardened black areas of the bilateral LE.  ? ?Cut to fit calcium alginate [place] # [id number])and place over the open clean wounds bilateral LEs ?Wrap from toes to knees with kerlix and 4" ACE wraps.  ?Change daily  ? 08/18/21 1133  ? ?  ?  ? ?  ? ? Follow-up Information   ? ? Nadara Mustard, MD Follow up in 1 week(s).   ?Specialty: Orthopedic Surgery ?Contact information: ?[address] ?[place] [address] ?[phone] ? ? ?  ?  ? ? Raymondo Band, MD Follow up on 08/31/2021.   ?Specialty: Infectious Diseases ?Why: 11 am ?Contact information: ?[address] ?[address] ?[place] [address] ?[phone] ? ? ?  ?  ? ?  ?  ? ?  ? ?Discharge Exam: ?Filed Weights  ? 08/11/21 0500 08/12/21 0425 08/15/21 0500  ?Weight: 71.4 kg 74.3 kg 79.9 kg  ? ?General-appears in no acute distress ?Heart-S1-S2, regular, no murmur auscultated ?Lungs-clear to auscultation bilaterally, no wheezing or crackles auscultated ?Abdomen-soft, nontender, no organomegaly ?Extremities-no edema in the lower extremities ?Neuro-alert, oriented x3, no focal deficit noted ? ?Condition at discharge: good ? ?The results of significant diagnostics from this hospitalization (including imaging, microbiology, ancillary and laboratory) are listed below for reference.  ? ?Imaging Studies: ?DG Chest 1 View ? ?Result Date: 08/08/2021 ?CLINICAL DATA:  Atrial fibrillation, shortness of breath EXAM: CHEST  1 VIEW COMPARISON:  Chest radiograph dated March 12, 2005 FINDINGS: The heart size and mediastinal contours are within normal limits. Atherosclerotic calcification of aortic arch. Elevation of the left hemidiaphragm. Lungs otherwise clear without evidence of focal consolidation or large pleural  effusion. Left costophrenic angle is not included. IMPRESSION: 1. Elevation of the left hemidiaphragm. Left costophrenic angle is not included. No focal consolidation or large pleural effusion. 2. Heart is normal in size. Atherosclerotic calcification of aortic arch. Electronically Signed   By: Larose Hires D.O.   On: 08/08/2021 22:43  ? ?DG Tibia/Fibula Left ? ?Result Date: 08/08/2021 ?CLINICAL DATA:  Bilateral lower leg wounds. Necrosis and weeping wounds entire both tibia/fibula and feet. EXAM: LEFT TIBIA AND FIBULA - 2 VIEW COMPARISON:  None. FINDINGS: There is no evidence of fracture or other focal bone lesions. No cortical erosion or periosteal reaction. Skin thickening and irregularity consistent with multiple skin consistent with patient's history. IMPRESSION: No acute osseous abnormality. No radiographic evidence of osteomyelitis. Skin irregularity consistent with history of skin wounds. Electronically Signed   By: Larose Hires D.O.   On: 08/08/2021 22:46  ? ?DG Tibia/Fibula Right ? ?Result Date: 08/08/2021 ?CLINICAL DATA:  Bilateral lower extremity skin wounds. EXAM: RIGHT TIBIA AND FIBULA - 2 VIEW COMPARISON:  None. FINDINGS: There is no evidence of fracture. No cortical erosion or periosteal reaction to suggest acute osteomyelitis.  Skin irregularity consistent with history of multiple skin wounds. IMPRESSION: 1. No cortical erosion or periosteal reaction to suggest acute osteomyelitis. 2. Skin irregularity consistent with history of skin wounds. Electronically Signed   By: Larose Hires D.O.   On: 08/08/2021 22:48  ? ?US RENAL ? ?Result Date: 08/09/2021 ?CLINICAL DATA:  Acute kidney injury EXAM: RENAL / URINARY TRACT ULTRASOUND COMPLETE COMPARISON:  None. FINDINGS: Right Kidney: Renal measurements: 9.3 x 4.8 x 6.1 cm = volume: 136 mL. Suboptimal visualization of the right kidney. Increased echogenicity of the cortex. Mild hydronephrosis. No mass. Left Kidney: Renal measurements: 10.7 x 5.7 x 5.2 cm = volume:  158 mL. Suboptimal evaluation left kidney due to bowel gas. Increased echogenicity of the cortex. Mild left hydronephrosis. No mass lesion. Bladder: Appears normal for degree of bladder distention. Other:

## 2021-08-18 NOTE — Progress Notes (Signed)
Report called and given to [place] at [facility]. ?

## 2021-08-18 NOTE — TOC Transition Note (Addendum)
Transition of Care (TOC) - CM/SW Discharge Note ? ? ?Patient Details  ?Name: [name] [name] ?MRN: [id number] ?Date of Birth: 1941-04-12 ? ?Transition of Care (TOC) CM/SW Contact:  ?Tyeler Goedken Aris Lot, LCSW ?Phone Number: ?08/18/2021, 10:38 AM ? ? ?Clinical Narrative:    ? ?CSW called pt Daughter/POA Carroll Sage. She provided [facility] as bed choice. CSW informed her of likely DC today. Will update prior to arranging PTAR. ? ?CSW called and confirmed with [facility] liaison that they can accept pt today. Will follow for DC today.  ? ?1230:  ?Patient will DC to: [facility] ?Anticipated DC date: 08/18/21 ?Family notified: Daughter Newman Waren ?Transport by: Sharin Mons ? ? ?Per MD patient ready for DC to [facility]. RN, patient, patient's family, and facility notified of DC. Discharge Summary and FL2 sent to facility. RN to call report prior to discharge [phone]). DC packet on chart. Ambulance transport requested for patient.  ? ?CSW will sign off for now as social work intervention is no longer needed. Please consult [place] again if new needs arise.  ? ?Final next level of care: Skilled Nursing Facility ?Barriers to Discharge: No Barriers Identified ? ? ?Patient Goals and CMS Choice ?  ?  ?  ? ?Discharge Placement ?  ?           ?Patient chooses bed at: [facility] ?Patient to be transferred to facility by: PTAR ?Name of family member notified: Nimesh, Riolo (Daughter)   [phone] [facility]) ?Patient and family notified of of transfer: 08/18/21 ? ?Discharge Plan and Services ?  ?  ?           ?  ?  ?  ?  ?  ?  ?  ?  ?  ?  ? ?Social Determinants of Health (SDOH) Interventions ?  ? ? ?Readmission Risk Interventions ?   ? View : No data to display.  ?  ?  ?  ? ? ? ? ? ?

## 2021-08-31 ENCOUNTER — Ambulatory Visit (INDEPENDENT_AMBULATORY_CARE_PROVIDER_SITE_OTHER): Payer: Medicare Other | Admitting: Internal Medicine

## 2021-08-31 ENCOUNTER — Other Ambulatory Visit: Payer: Self-pay

## 2021-08-31 ENCOUNTER — Encounter: Payer: Self-pay | Admitting: Internal Medicine

## 2021-08-31 VITALS — BP 151/84 | HR 98 | Temp 97.9°F

## 2021-08-31 DIAGNOSIS — L03119 Cellulitis of unspecified part of limb: Secondary | ICD-10-CM | POA: Diagnosis not present

## 2021-08-31 DIAGNOSIS — S81809A Unspecified open wound, unspecified lower leg, initial encounter: Secondary | ICD-10-CM | POA: Diagnosis not present

## 2021-08-31 DIAGNOSIS — I878 Other specified disorders of veins: Secondary | ICD-10-CM

## 2021-08-31 DIAGNOSIS — L0390 Cellulitis, unspecified: Secondary | ICD-10-CM

## 2021-08-31 NOTE — Patient Instructions (Signed)
You have no active infection at this time ? ? ?Your legs look much better ? ?Continue wound care ? ? ?If these occur, call our clinic for evaluation ?Fever/chill ?Increased pain, swelling, warmth in the legs, and purulence output ?

## 2021-08-31 NOTE — Progress Notes (Signed)
?  ? ? ? ? ?[facility] for Infectious Disease ? ?Reason for Consult:hospital follow up ?Referring Provider: n/a ? ? ? ?Patient Active Problem List  ? Diagnosis Date Noted  ? Venous stasis dermatitis of both lower extremities   ? Open wound   ? Malnutrition of moderate degree 08/12/2021  ? Adjustment disorder with depressed mood   ? Renal failure 08/09/2021  ? Cellulitis   ? Sepsis with acute organ dysfunction without septic shock (HCC)   ? Moderate dementia with psychotic disturbance ([place])   ? Acute renal failure ([place]) 08/08/2021  ? Hyperkalemia 08/08/2021  ? Hypermagnesemia 08/08/2021  ? Uremia 08/08/2021  ? Hyponatremia 08/08/2021  ? Coronary artery disease involving native coronary artery of native heart without angina pectoris 09/04/2016  ? Persistent atrial fibrillation ([place]) 09/04/2016  ? Hyperlipidemia   ? ? ? ? ?HPI: [name] [name] is a 81 y.o. male with afib, cad, venous stasis, admitted recently 4/10-4/20/23 with failure to thrive and bilateral LE open wounds and cellulitis, here for follow up ? ?He was evaluated by ID. He received 5 days of iv abx and transitioned to amox-clav. Admission bcx negative. Wound cx not done as anticipated to be polymicrobial in setting chronic open wound. He improved in regard to cellulitic change but anticipated to need extensive ongoing wound care ? ?No sinus tracts visualized. Xrays showed no osseous involvement ? ?He was discharged on about 2 weeks abx to finish on 4/27 ? ?He is a snf, doing pt/ot, walkign with help and fww and getting wound care ? ?Legs looking better. Off abx ? ?Review of Systems: ?ROS ? ?All other ros negative ? ? ? ? ?Past Medical History:  ?Diagnosis Date  ? CAD (coronary artery disease)   ? LHC 11/06: pLAD 90, pD1 30, oD2 25 with inf branch 60; septal perf ostial 95; prox MOM 25, RCA 25, oPL2 95, EF 65 >> PCI:  BMS to proximal LAD   ? FH: CAD (coronary artery disease)   ? History of echocardiogram   ? Echo 3/17:  Mod LVH, EF 55-60, no RWMA, mild  AI, mild to mod RAE  ? History of tobacco abuse   ? Hyperlipidemia   ? Persistent atrial fibrillation ([place])   ? Xarelto for a/c  ? ? ?Social History  ? ?Tobacco Use  ? Smoking status: Former  ?  Types: Cigarettes  ?  Quit date: 05/10/1978  ?  Years since quitting: 43.3  ? Smokeless tobacco: Never  ?Vaping Use  ? Vaping Use: Never used  ?Substance Use Topics  ? Alcohol use: No  ? Drug use: No  ? ? ?Family History  ?Problem Relation Age of Onset  ? Cancer - Colon Mother   ? Heart attack Father   ? Hypertension Father   ? ? ?No Known Allergies ? ?OBJECTIVE: ?There were no vitals filed for this visit. ?There is no height or weight on file to calculate BMI. ? ? ?Physical Exam ?General/constitutional: no distress, pleasant; in wheel chair; here with daughter ?HEENT: Normocephalic, PER, Conj Clear, EOMI, Oropharynx clear ?Neck supple ?CV: rrr no mrg ?Lungs: clear to auscultation, normal respiratory effort ?Abd: Soft, Nontender ?Ext/skin -- see pics ?Neuro: weak LE bilaterally 4/5 ?MSK: no peripheral joint swelling/tenderness/warmth; back spines nontender ? ? ? ? ? ? ? ? ?Lab: ?Lab Results  ?Component Value Date  ? WBC 6.4 08/17/2021  ? HGB 9.5 (L) 08/17/2021  ? HCT 30.6 (L) 08/17/2021  ? MCV 89.5 08/17/2021  ? PLT  181 08/17/2021  ? ?Last metabolic panel ?Lab Results  ?Component Value Date  ? GLUCOSE 96 08/15/2021  ? NA 138 08/15/2021  ? K 3.9 08/15/2021  ? CL 107 08/15/2021  ? CO2 28 08/15/2021  ? BUN 36 (H) 08/15/2021  ? CREATININE 1.09 08/15/2021  ? GFRNONAA >60 08/15/2021  ? CALCIUM 8.0 (L) 08/15/2021  ? PHOS 2.5 08/11/2021  ? PROT 5.2 (L) 08/13/2021  ? ALBUMIN 2.0 (L) 08/13/2021  ? BILITOT 0.6 08/13/2021  ? ALKPHOS 71 08/13/2021  ? AST 30 08/13/2021  ? ALT 20 08/13/2021  ? ANIONGAP 3 (L) 08/15/2021  ? ? ?Microbiology: ? ?Serology: ? ?Imaging: ? ? ?Assessment/plan: ?Problem List Items Addressed This Visit   ? ?  ? Other  ? Cellulitis  ? ?Other Visit Diagnoses   ? ? Open wounds involving multiple regions of lower  extremity    -  Primary  ? Venous stasis      ? ?  ? ?Discussed chronic venous stasis associated wounds improving but need ongoing wound care/venous stasis treatment ? ?Suggest him to have ongoing home health care or wound clinic care after snf discharge ? ? ?Follow up as needed if sign of soft tissue infection ? ?I have spent a total of 20 minutes of face-to-face and non-face-to-face time, excluding clinical staff time, preparing to see patient, ordering tests and/or medications, and provide counseling the patient ? ? ? ? ? ?Follow-up: Return if symptoms worsen or fail to improve. ? ?Jabier Mutton, MD ?[facility] ?[facility] ?08/31/2021, 11:20 AM;i ?

## 2021-09-01 ENCOUNTER — Ambulatory Visit (INDEPENDENT_AMBULATORY_CARE_PROVIDER_SITE_OTHER): Payer: Medicare Other | Admitting: Orthopedic Surgery

## 2021-09-01 DIAGNOSIS — L97929 Non-pressure chronic ulcer of unspecified part of left lower leg with unspecified severity: Secondary | ICD-10-CM

## 2021-09-01 DIAGNOSIS — I83029 Varicose veins of left lower extremity with ulcer of unspecified site: Secondary | ICD-10-CM | POA: Diagnosis not present

## 2021-09-01 DIAGNOSIS — L97919 Non-pressure chronic ulcer of unspecified part of right lower leg with unspecified severity: Secondary | ICD-10-CM

## 2021-09-01 DIAGNOSIS — I83019 Varicose veins of right lower extremity with ulcer of unspecified site: Secondary | ICD-10-CM

## 2021-09-05 ENCOUNTER — Other Ambulatory Visit: Payer: Self-pay

## 2021-09-05 ENCOUNTER — Encounter (HOSPITAL_COMMUNITY): Payer: Self-pay | Admitting: Emergency Medicine

## 2021-09-05 ENCOUNTER — Emergency Department (HOSPITAL_COMMUNITY): Payer: Medicare Other

## 2021-09-05 ENCOUNTER — Observation Stay (HOSPITAL_COMMUNITY)
Admission: EM | Admit: 2021-09-05 | Discharge: 2021-09-06 | Disposition: A | Discharge status: Skilled Nursing Facility | Payer: Medicare Other | Attending: Internal Medicine | Admitting: Internal Medicine

## 2021-09-05 DIAGNOSIS — F03B2 Unspecified dementia, moderate, with psychotic disturbance: Secondary | ICD-10-CM | POA: Insufficient documentation

## 2021-09-05 DIAGNOSIS — Z87891 Personal history of nicotine dependence: Secondary | ICD-10-CM | POA: Diagnosis not present

## 2021-09-05 DIAGNOSIS — I872 Venous insufficiency (chronic) (peripheral): Secondary | ICD-10-CM | POA: Diagnosis present

## 2021-09-05 DIAGNOSIS — L03116 Cellulitis of left lower limb: Principal | ICD-10-CM | POA: Insufficient documentation

## 2021-09-05 DIAGNOSIS — E785 Hyperlipidemia, unspecified: Secondary | ICD-10-CM | POA: Diagnosis not present

## 2021-09-05 DIAGNOSIS — Z955 Presence of coronary angioplasty implant and graft: Secondary | ICD-10-CM | POA: Insufficient documentation

## 2021-09-05 DIAGNOSIS — Z79899 Other long term (current) drug therapy: Secondary | ICD-10-CM | POA: Diagnosis not present

## 2021-09-05 DIAGNOSIS — T148XXA Other injury of unspecified body region, initial encounter: Secondary | ICD-10-CM | POA: Diagnosis present

## 2021-09-05 DIAGNOSIS — L089 Local infection of the skin and subcutaneous tissue, unspecified: Secondary | ICD-10-CM | POA: Diagnosis present

## 2021-09-05 DIAGNOSIS — Z7901 Long term (current) use of anticoagulants: Secondary | ICD-10-CM | POA: Diagnosis not present

## 2021-09-05 DIAGNOSIS — E440 Moderate protein-calorie malnutrition: Secondary | ICD-10-CM | POA: Diagnosis not present

## 2021-09-05 DIAGNOSIS — I2510 Atherosclerotic heart disease of native coronary artery without angina pectoris: Secondary | ICD-10-CM | POA: Insufficient documentation

## 2021-09-05 DIAGNOSIS — Z7982 Long term (current) use of aspirin: Secondary | ICD-10-CM | POA: Insufficient documentation

## 2021-09-05 DIAGNOSIS — I4819 Other persistent atrial fibrillation: Secondary | ICD-10-CM | POA: Insufficient documentation

## 2021-09-05 DIAGNOSIS — L0390 Cellulitis, unspecified: Secondary | ICD-10-CM | POA: Diagnosis present

## 2021-09-05 LAB — COMPREHENSIVE METABOLIC PANEL
ALT: 9 U/L (ref 0–44)
AST: 15 U/L (ref 15–41)
Albumin: 2.6 g/dL — ABNORMAL LOW (ref 3.5–5.0)
Alkaline Phosphatase: 68 U/L (ref 38–126)
Anion gap: 5 (ref 5–15)
BUN: 33 mg/dL — ABNORMAL HIGH (ref 8–23)
CO2: 27 mmol/L (ref 22–32)
Calcium: 8.6 mg/dL — ABNORMAL LOW (ref 8.9–10.3)
Chloride: 109 mmol/L (ref 98–111)
Creatinine, Ser: 1.01 mg/dL (ref 0.61–1.24)
GFR, Estimated: >60 mL/min (ref >60)
Glucose, Bld: 101 mg/dL — ABNORMAL HIGH (ref 70–99)
Potassium: 4 mmol/L (ref 3.5–5.1)
Sodium: 141 mmol/L (ref 135–145)
Total Bilirubin: 0.4 mg/dL (ref 0.3–1.2)
Total Protein: 6.1 g/dL — ABNORMAL LOW (ref 6.5–8.1)

## 2021-09-05 LAB — CBC WITH DIFFERENTIAL/PLATELET
Abs Immature Granulocytes: 0.03 10*3/uL (ref 0.00–0.07)
Basophils Absolute: 0.1 10*3/uL (ref 0.0–0.1)
Basophils Relative: 1 %
Eosinophils Absolute: 0.3 10*3/uL (ref 0.0–0.5)
Eosinophils Relative: 5 %
HCT: 34.6 % — ABNORMAL LOW (ref 39.0–52.0)
Hemoglobin: 10.8 g/dL — ABNORMAL LOW (ref 13.0–17.0)
Immature Granulocytes: 1 %
Lymphocytes Relative: 17 %
Lymphs Abs: 1.1 10*3/uL (ref 0.7–4.0)
MCH: 27.6 pg (ref 26.0–34.0)
MCHC: 31.2 g/dL (ref 30.0–36.0)
MCV: 88.3 fL (ref 80.0–100.0)
Monocytes Absolute: 0.6 10*3/uL (ref 0.1–1.0)
Monocytes Relative: 9 %
Neutro Abs: 4.2 10*3/uL (ref 1.7–7.7)
Neutrophils Relative %: 67 %
Platelets: 318 10*3/uL (ref 150–400)
RBC: 3.92 MIL/uL — ABNORMAL LOW (ref 4.22–5.81)
RDW: 16.7 % — ABNORMAL HIGH (ref 11.5–15.5)
WBC: 6.3 10*3/uL (ref 4.0–10.5)
nRBC: 0 % (ref 0.0–0.2)

## 2021-09-05 LAB — PROTIME-INR
INR: 1.2 (ref 0.8–1.2)
Prothrombin Time: 15 seconds (ref 11.4–15.2)

## 2021-09-05 LAB — LACTIC ACID, PLASMA: Lactic Acid, Venous: 1 mmol/L (ref 0.5–1.9)

## 2021-09-05 MED ORDER — VANCOMYCIN HCL IN DEXTROSE 1-5 GM/200ML-% IV SOLN
1000.0000 mg | Freq: Once | INTRAVENOUS | Status: DC
  Administered 2021-09-05: 1000 mg via INTRAVENOUS
  Filled 2021-09-05: qty 200

## 2021-09-05 MED ORDER — APIXABAN 5 MG PO TABS
5.0000 mg | ORAL_TABLET | Freq: Two times a day (BID) | ORAL | Status: DC
  Administered 2021-09-05 – 2021-09-06 (×2): 5 mg via ORAL
  Filled 2021-09-05 (×2): qty 1

## 2021-09-05 MED ORDER — ATENOLOL 25 MG PO TABS
25.0000 mg | ORAL_TABLET | Freq: Every day | ORAL | Status: DC | PRN
  Filled 2021-09-05: qty 1

## 2021-09-05 MED ORDER — ATORVASTATIN CALCIUM 10 MG PO TABS
10.0000 mg | ORAL_TABLET | Freq: Every day | ORAL | Status: DC
  Administered 2021-09-05 – 2021-09-06 (×2): 10 mg via ORAL
  Filled 2021-09-05 (×2): qty 1

## 2021-09-05 MED ORDER — ASCORBIC ACID 500 MG PO TABS
500.0000 mg | ORAL_TABLET | Freq: Every day | ORAL | Status: DC
  Administered 2021-09-06: 500 mg via ORAL
  Filled 2021-09-05: qty 1

## 2021-09-05 MED ORDER — MORPHINE SULFATE (PF) 2 MG/ML IV SOLN: 2.0000 mg | INTRAVENOUS | Status: DC | PRN

## 2021-09-05 MED ORDER — ASPIRIN EC 81 MG PO TBEC
81.0000 mg | DELAYED_RELEASE_TABLET | Freq: Every day | ORAL | Status: DC
  Administered 2021-09-05 – 2021-09-06 (×2): 81 mg via ORAL
  Filled 2021-09-05 (×2): qty 1

## 2021-09-05 MED ORDER — VANCOMYCIN HCL 1500 MG/300ML IV SOLN
1500.0000 mg | INTRAVENOUS | Status: DC
  Filled 2021-09-05: qty 300

## 2021-09-05 MED ORDER — HYDROCODONE-ACETAMINOPHEN 5-325 MG PO TABS
1.0000 | ORAL_TABLET | Freq: Four times a day (QID) | ORAL | Status: DC | PRN
Start: 2021-09-05 — End: 2021-09-06

## 2021-09-05 MED ORDER — ACETAMINOPHEN 325 MG PO TABS
650.0000 mg | ORAL_TABLET | Freq: Four times a day (QID) | ORAL | Status: DC | PRN
Start: 2021-09-05 — End: 2021-09-06
  Administered 2021-09-06: 650 mg via ORAL
  Filled 2021-09-05: qty 2

## 2021-09-05 MED ORDER — JUVEN PO PACK
1.0000 | PACK | Freq: Two times a day (BID) | ORAL | Status: DC
  Administered 2021-09-06: 1 via ORAL
  Filled 2021-09-05 (×2): qty 1

## 2021-09-05 MED ORDER — IOHEXOL 300 MG/ML  SOLN
100.0000 mL | Freq: Once | INTRAMUSCULAR | Status: AC | PRN
  Administered 2021-09-05: 100 mL via INTRAVENOUS

## 2021-09-05 MED ORDER — ACETAMINOPHEN 650 MG RE SUPP
650.0000 mg | Freq: Four times a day (QID) | RECTAL | Status: DC | PRN
Start: 2021-09-05 — End: 2021-09-06

## 2021-09-05 MED ORDER — VANCOMYCIN HCL 750 MG/150ML IV SOLN
750.0000 mg | Freq: Once | INTRAVENOUS | Status: AC
  Administered 2021-09-05: 750 mg via INTRAVENOUS
  Filled 2021-09-05 (×2): qty 150

## 2021-09-05 MED ORDER — FERROUS SULFATE 325 (65 FE) MG PO TABS
325.0000 mg | ORAL_TABLET | Freq: Every day | ORAL | Status: DC
  Administered 2021-09-06: 325 mg via ORAL
  Filled 2021-09-05: qty 1

## 2021-09-05 MED ORDER — ZINC SULFATE 220 (50 ZN) MG PO CAPS
220.0000 mg | ORAL_CAPSULE | Freq: Every day | ORAL | Status: DC
  Administered 2021-09-05 – 2021-09-06 (×2): 220 mg via ORAL
  Filled 2021-09-05 (×2): qty 1

## 2021-09-05 NOTE — Assessment & Plan Note (Addendum)
Denies chest pain at this time ?Continue home meds as tolerated ?

## 2021-09-05 NOTE — Plan of Care (Signed)

## 2021-09-05 NOTE — Progress Notes (Signed)
Pharmacy Antibiotic Note ? ?[name] [name] is a 81 y.o. male admitted on 09/05/2021 with wound infection/cellulitis.  Pharmacy has been consulted for Vancomycin dosing.  ? ?Plan: ?Vancomycin 1g IV x 1 given in the ED. Give additional 750mg  IV x 1 to complete loading dose. Then, continue with Vancomycin 1500mg  IV q24h. ?Vancomycin levels at steady state, as indicated. ?Monitor renal function, cultures, clinical course.  ? ?Height: 5\' 11"  (180.3 cm) ?Weight: 79.6 kg (175 lb 7.8 oz) ?IBW/kg (Calculated) : 75.3 ? ?Temp (24hrs), Avg:98.4 ?F (36.9 ?C), Min:98.4 ?F (36.9 ?C), Max:98.4 ?F (36.9 ?C) ? ?Recent Labs  ?Lab 09/05/21 ?1208  ?WBC 6.3  ?CREATININE 1.01  ?LATICACIDVEN 1.0  ?  ?Estimated Creatinine Clearance: 62.1 mL/min (by C-G formula based on SCr of 1.01 mg/dL).   ? ?No Known Allergies ? ?Antimicrobials this admission: ?5/8 Vancomycin >> ? ?Dose adjustments this admission: ?-- ? ?Microbiology results: ?5/8 BCx:  ? ? ?Thank you for allowing pharmacy to be a part of this patient?s care. ? ? ?Lindell Spar, PharmD, BCPS ?Clinical Pharmacist  ?09/05/2021 6:55 PM ?

## 2021-09-05 NOTE — ED Provider Notes (Signed)
?[facility]-EMERGENCY DEPT ?Provider Note ? ? ?CSN: [id number] ?Arrival date & time: 09/05/21  1114 ? ?  ? ?History ? ?Chief Complaint  ?Patient presents with  ? Wound Infection  ? ? ?[name] [name] is a 81 y.o. male. ? ?The history is provided by the patient and medical records. No language interpreter was used.  ?Wound Check ?This is a recurrent problem. The problem occurs constantly. The problem has been gradually worsening. Pertinent negatives include no chest pain, no abdominal pain, no headaches and no shortness of breath. Nothing aggravates the symptoms. Nothing relieves the symptoms. He has tried nothing for the symptoms. The treatment provided no relief.  ? ?  ? ?Home Medications ?Prior to Admission medications   ?Medication Sig Start Date End Date Taking? Authorizing Provider  ?amoxicillin-clavulanate (AUGMENTIN) 875-125 MG tablet Take 1 tablet by mouth every 12 (twelve) hours. 08/18/21   Meredeth Ide, MD  ?apixaban (ELIQUIS) 5 MG TABS tablet Take 1 tablet (5 mg total) by mouth 2 (two) times daily. 08/18/21   Meredeth Ide, MD  ?feeding supplement (ENSURE ENLIVE / ENSURE PLUS) LIQD Take 237 mLs by mouth 2 (two) times daily between meals. 08/18/21   Meredeth Ide, MD  ?ferrous sulfate 325 (65 FE) MG tablet Take 325 mg by mouth daily with breakfast.    [provider]  ?HYDROcodone-acetaminophen (NORCO/VICODIN) 5-325 MG tablet Take 1 tablet by mouth every 6 (six) hours as needed for moderate pain.    [provider]  ?nutrition supplement, JUVEN, (JUVEN) PACK Take 1 packet by mouth 2 (two) times daily between meals. 08/18/21   Meredeth Ide, MD  ?   ? ?Allergies    ?Patient has no known allergies.   ? ?Review of Systems   ?Review of Systems  ?Constitutional:  Negative for chills, diaphoresis, fatigue and fever.  ?HENT:  Negative for congestion.   ?Eyes:  Negative for visual disturbance.  ?Respiratory:  Negative for cough, chest tightness, shortness of breath and wheezing.    ?Cardiovascular:  Negative for chest pain, palpitations and leg swelling.  ?Gastrointestinal:  Negative for abdominal pain, constipation, diarrhea, nausea and vomiting.  ?Genitourinary:  Negative for dysuria and flank pain.  ?Musculoskeletal:  Negative for back pain, neck pain and neck stiffness.  ?Skin:  Positive for wound.  ?Neurological:  Negative for dizziness, weakness, light-headedness, numbness and headaches.  ?Psychiatric/Behavioral:  Negative for agitation and confusion.   ?All other systems reviewed and are negative. ? ?Physical Exam ?Updated Vital Signs ?BP 125/75 (BP Location: Left Arm)   Pulse 78   Temp 98.4 ?F (36.9 ?C) (Oral)   Resp 16   SpO2 99%  ?Physical Exam ?Vitals and nursing note reviewed.  ?Constitutional:   ?   General: He is not in acute distress. ?   Appearance: He is well-developed. He is not ill-appearing, toxic-appearing or diaphoretic.  ?HENT:  ?   Head: Normocephalic and atraumatic.  ?   Mouth/Throat:  ?   Mouth: Mucous membranes are moist.  ?Eyes:  ?   Extraocular Movements: Extraocular movements intact.  ?   Conjunctiva/sclera: Conjunctivae normal.  ?   Pupils: Pupils are equal, round, and reactive to light.  ?Cardiovascular:  ?   Rate and Rhythm: Normal rate and regular rhythm.  ?   Heart sounds: No murmur heard. ?Pulmonary:  ?   Effort: Pulmonary effort is normal. No respiratory distress.  ?   Breath sounds: Normal breath sounds. No wheezing, rhonchi or rales.  ?Chest:  ?  Chest wall: No tenderness.  ?Abdominal:  ?   General: Abdomen is flat.  ?   Palpations: Abdomen is soft.  ?   Tenderness: There is no abdominal tenderness. There is no right CVA tenderness, left CVA tenderness, guarding or rebound.  ?Musculoskeletal:     ?   General: Tenderness present. No swelling.  ?   Cervical back: Neck supple.  ?   Right lower leg: Edema present.  ?   Left lower leg: Edema present.  ?   Comments: Patient has large wounds on both legs with surrounding erythema.  Left leg wounds were  covered in foul-smelling yellow/gray/green purulent appearing material that was removed with the bandages.  Right leg has less wounds.  Did have palpable DP pulses bilaterally.  He could wiggle toes and had intact sensation.  Erythema goes towards the knees.  ?Skin: ?   General: Skin is warm and dry.  ?   Capillary Refill: Capillary refill takes less than 2 seconds.  ?   Findings: Erythema present.  ?Neurological:  ?   General: No focal deficit present.  ?   Mental Status: He is alert.  ?   Sensory: No sensory deficit.  ?   Motor: No weakness.  ?Psychiatric:     ?   Mood and Affect: Mood normal.  ? ? ? ? ? ?ED Results / Procedures / Treatments   ?Labs ?(all labs ordered are listed, but only abnormal results are displayed) ?Labs Reviewed  ?COMPREHENSIVE METABOLIC PANEL - Abnormal; Notable for the following components:  ?    Result Value  ? Glucose, Bld 101 (*)   ? BUN 33 (*)   ? Calcium 8.6 (*)   ? Total Protein 6.1 (*)   ? Albumin 2.6 (*)   ? All other components within normal limits  ?CBC WITH DIFFERENTIAL/PLATELET - Abnormal; Notable for the following components:  ? RBC 3.92 (*)   ? Hemoglobin 10.8 (*)   ? HCT 34.6 (*)   ? RDW 16.7 (*)   ? All other components within normal limits  ?CULTURE, BLOOD (ROUTINE X 2)  ?CULTURE, BLOOD (ROUTINE X 2)  ?LACTIC ACID, PLASMA  ?PROTIME-INR  ?LACTIC ACID, PLASMA  ?URINALYSIS, ROUTINE W REFLEX MICROSCOPIC  ? ? ?EKG ?None ? ?Radiology ?DG Tibia/Fibula Left ? ?Result Date: 09/05/2021 ?CLINICAL DATA:  Cellulitis. Rule out subcutaneous gas or evidence of osteomyelitis. EXAM: LEFT TIBIA AND FIBULA - 2 VIEW COMPARISON:  08/08/2021 FINDINGS: No acute fracture or dislocation. No focal bony erosions identified to suggest osteomyelitis. Skin thickening and irregularity is identified likely reflecting sequelae of chronic venous stasis. Compared with the previous exam there is long segment of linear increased lucency within medial subcutaneous soft tissues of the left lower extremity.  Additionally, there is a small focal lucency within the lateral subcutaneous soft tissues. IMPRESSION: 1. No evidence for osteomyelitis. 2. Increase in lucency within the medial subcutaneous soft tissues of the left lower extremity. These findings are nonspecific but may be seen with underlying fluid collection. 3. Chronic venous stasis. Electronically Signed   By: Signa Kellaylor  Stroud M.D.   On: 09/05/2021 13:35  ? ?DG Tibia/Fibula Right ? ?Result Date: 09/05/2021 ?CLINICAL DATA:  Cellulitis EXAM: RIGHT TIBIA AND FIBULA - 2 VIEW COMPARISON:  MRI 08/10/2021 FINDINGS: No acute fracture. No malalignment. No evidence of erosion or periosteal elevation. Bones are mildly demineralized. Mild generalized soft tissue edema. No soft tissue gas. IMPRESSION: 1. Mild generalized soft tissue edema. No soft tissue gas. 2. No acute  osseous abnormality. Electronically Signed   By: Duanne Guess D.O.   On: 09/05/2021 13:31  ? ?CT EXTREMITY LOWER LEFT W CONTRAST ? ?Result Date: 09/05/2021 ?CLINICAL DATA:  Soft tissue infection suspected of the lower leg. Radiographs shows concern for tracking fluid subcutaneously. Evaluate for abscess or necrotizing fasciitis. EXAM: CT OF THE LOWER LEFT EXTREMITY WITH CONTRAST TECHNIQUE: Multidetector CT imaging of the lower left extremity was performed according to the standard protocol following intravenous contrast administration. RADIATION DOSE REDUCTION: This exam was performed according to the departmental dose-optimization program which includes automated exposure control, adjustment of the mA and/or kV according to patient size and/or use of iterative reconstruction technique. CONTRAST:  OMNIPAQUE IOHEXOL 300 MG/ML  SOLN COMPARISON:  Left tibia and fibula radiographs 09/05/2021; right tibia and fibula radiographs for comparison; MRI bilateral tibiae and fibulae and bilateral feet 08/10/2021 FINDINGS: Bones/Joint/Cartilage There is diffuse decreased bone mineralization. Small anterior superior  left femoral head-neck junction degenerative cyst. Mild-to-moderate patellofemoral joint space narrowing with mild to moderate patellar subchondral degenerative cysts. There is a lucent lesion with multiple thin interna

## 2021-09-05 NOTE — Assessment & Plan Note (Signed)
Consult wound care as per above ?

## 2021-09-05 NOTE — Assessment & Plan Note (Signed)
Seems to be stable at this time ?

## 2021-09-05 NOTE — Assessment & Plan Note (Signed)
Presently rate controlled ?Continue Eliquis and home regimen ?

## 2021-09-05 NOTE — H&P (Signed)
?History and Physical  ? ? ?Patient: [name] [name] BTD:[id number] DOB: 1940-12-21 ?DOA: 09/05/2021 ?DOS: the patient was seen and examined on 09/05/2021 ?PCP: Doristine Bosworth, MD  ?Patient coming from: [facility] ? ?Chief Complaint:  ?Chief Complaint  ?Patient presents with  ? Wound Infection  ? ?Pt is an 81yo with hx afib on anticoagulation, venous stasis, CAD, history of bilateral lower extremity wounds with prior cellulitis presenting with concerns for cellulitis involving left lower extremity, failing outpatient antibiotics.  Of note, patient is a very poor historian.  Patient reportedly was discharged recently on 08/18/2021 or patient was treated for sepsis with cellulitis.  Imaging on admission was notable for questionable evidence of osteomyelitis in right foot which was later determined not to be osteomyelitis.  Patient was treated with 1 additional week of oral antibiotics at time of discharge.  Patient was closely followed up with infectious disease on 08/31/2021.  That visit, patient was noted to have continued open wounds which needed ongoing wound care, overall improved. ? ?At [facility], patient was noted to have worsening of left lower extremity wound with new drainage noted and foul smell.  Patient was subsequent referred to the emergency department.  ? ?In emergency department, plain film of the left tibia/fibula was negative for osteomyelitis subsequent CT scan of the left lower extremity was performed, demonstrating edema suggestive of cellulitis.  No evidence of acute osteomyelitis seen on CT scan.  No evidence of abscess also seen on CT scan.  Given concerns of nonhealing wound with cellulitis, hospital service consulted for consideration for medical admission ? ?Review of Systems: As mentioned in the history of present illness. All other systems reviewed and are negative. ?Past Medical History:  ?Diagnosis Date  ? CAD (coronary artery disease)   ? LHC 11/06: pLAD 90, pD1 30, oD2 25 with inf  branch 60; septal perf ostial 95; prox MOM 25, RCA 25, oPL2 95, EF 65 >> PCI:  BMS to proximal LAD   ? FH: CAD (coronary artery disease)   ? History of echocardiogram   ? Echo 3/17:  Mod LVH, EF 55-60, no RWMA, mild AI, mild to mod RAE  ? History of tobacco abuse   ? Hyperlipidemia   ? Persistent atrial fibrillation (HCC)   ? Xarelto for a/c  ? ?Past Surgical History:  ?Procedure Laterality Date  ? CARDIAC CATHETERIZATION    ? CORONARY STENT PLACEMENT    ? stents of the proximal lad with bare metal stent  ? PERCUTANEOUS CORONARY STENT INTERVENTION (PCI-S)    ? ?Social History:  reports that he quit smoking about 43 years ago. His smoking use included cigarettes. He has never used smokeless tobacco. He reports that he does not drink alcohol and does not use drugs. ? ?No Known Allergies ? ?Family History  ?Problem Relation Age of Onset  ? Cancer - Colon Mother   ? Heart attack Father   ? Hypertension Father   ? ? ?Prior to Admission medications   ?Medication Sig Start Date End Date Taking? Authorizing Provider  ?acetaminophen (TYLENOL) 500 MG tablet Take 1,000 mg by mouth daily.   Yes [provider]  ?apixaban (ELIQUIS) 5 MG TABS tablet Take 1 tablet (5 mg total) by mouth 2 (two) times daily. 08/18/21  Yes Meredeth Ide, MD  ?aspirin EC 81 MG tablet Take 81 mg by mouth daily. Swallow whole.   Yes [provider]  ?atenolol (TENORMIN) 25 MG tablet Take 25 mg by mouth daily as  needed (if HR > 99 bpm afib).   Yes [provider]  ?atorvastatin (LIPITOR) 10 MG tablet Take 10 mg by mouth daily. 09/02/21  Yes [provider]  ?ferrous sulfate 325 (65 FE) MG tablet Take 325 mg by mouth daily with breakfast.   Yes [provider]  ?HYDROcodone-acetaminophen (NORCO/VICODIN) 5-325 MG tablet Take 1 tablet by mouth every 6 (six) hours as needed for moderate pain.   Yes [provider]  ?nutrition supplement, JUVEN, (JUVEN) PACK Take 1 packet by mouth 2 (two) times daily between  meals. 08/18/21  Yes Meredeth IdeLama, Gagan S, MD  ?nystatin (MYCOSTATIN/NYSTOP) powder Apply 1 application. topically 2 (two) times daily. Apply to groin area   Yes [provider]  ?Probiotic Product (ADVANCED PROBIOTIC 10) CAPS Take 1 capsule by mouth daily.   Yes [provider]  ?vitamin C (ASCORBIC ACID) 500 MG tablet Take 500 mg by mouth daily.   Yes [provider]  ?zinc gluconate 50 MG tablet Take 50 mg by mouth daily.   Yes [provider]  ?amoxicillin-clavulanate (AUGMENTIN) 875-125 MG tablet Take 1 tablet by mouth every 12 (twelve) hours. ?Patient not taking: Reported on 09/05/2021 08/18/21   Meredeth IdeLama, Gagan S, MD  ? ? ?Physical Exam: ?Vitals:  ? 09/05/21 1500 09/05/21 1530 09/05/21 1630 09/05/21 1654  ?BP: 140/78 140/78 (!) 151/97   ?Pulse: 79 79 74   ?Resp: 16 16 16    ?Temp:      ?TempSrc:      ?SpO2: 99% 99% 100%   ?Weight:    81.6 kg  ? ?General exam: Awake, laying in bed, in nad ?Respiratory system: Normal respiratory effort, no wheezing ?Cardiovascular system: regular rate, s1, s2 ?Gastrointestinal system: Soft, nondistended, positive BS ?Central nervous system: CN2-12 grossly intact, strength intact ?Extremities: Perfused, no clubbing ?Skin: Chronic venous stasis skin changes noted bilateral lower extremities, open skin wound over anterior left shin with surrounding warm erythema, purulent and sanguinous fluid soaked through overlying sheets ?Psychiatry: Mood normal // no visual hallucinations  ? ?Data Reviewed: ? ?CT scan reviewed, notable for edema and swelling within the distal greater than proximal calf and the dorsal mid foot on the left.  Findings suggestive of cellulitis.  No evidence of osteomyelitis ? ?Assessment and Plan: ?* Cellulitis ?CT scan demonstrating soft tissue edema, purulent material noted on dressings ?Patient recently treated with antibiotics and was seen by infectious disease and follow-up ?Patient empirically on vancomycin to cover purulent  cellulitis ?Patient is currently afebrile w/ no leukocytosis ?Follow-up on blood cultures ?Recheck CBC in morning ? ?Open wound ?We will consult wound ostomy ?Continue empiric antibiotic per above ? ?Venous stasis dermatitis of both lower extremities ?Consult wound care as per above ? ?Malnutrition of moderate degree ?Encourage p.o. as tolerated ? ?Moderate dementia with psychotic disturbance (HCC) ?Seems to be stable at this time ? ?Persistent atrial fibrillation (HCC) ?Presently rate controlled ?Continue Eliquis and home regimen ? ?Coronary artery disease involving native coronary artery of native heart without angina pectoris ?Denies chest pain at this time ?-Continue home meds as tolerated ? ?Hyperlipidemia ?Would continue home medication once confirmed by pharmacy ? ? ? ? ? Advance Care Planning:   Code Status: Full Code full code, confirmed by MOST form at bedside ? ?Consults:  ? ?Family Communication: Patient in room, family not at bedside ? ?Severity of Illness: ?The appropriate patient status for this patient is INPATIENT. Inpatient status is judged to be reasonable and necessary in order to provide the  required intensity of service to ensure the patient's safety. The patient's presenting symptoms, physical exam findings, and initial radiographic and laboratory data in the context of their chronic comorbidities is felt to place them at high risk for further clinical deterioration. Furthermore, it is not anticipated that the patient will be medically stable for discharge from the hospital within 2 midnights of admission.  ? ?* I certify that at the point of admission it is my clinical judgment that the patient will require inpatient hospital care spanning beyond 2 midnights from the point of admission due to high intensity of service, high risk for further deterioration and high frequency of surveillance required.* ? ?Author: ?Rickey Barbara, MD ?09/05/2021 4:58 PM ? ?For on call review [email or web address].  ?

## 2021-09-05 NOTE — ED Provider Triage Note (Signed)
Emergency Medicine Provider Triage Evaluation Note ? ?[name] [name] , a 81 y.o. male  was evaluated in triage.  Pt complains of bilateral lower extremity wounds that have been progressively worsening over the last couple of weeks.  Patient denies any pain.  Sent from rehab facility.  No fever or chills. ? ?Review of Systems  ?Positive:  ?Negative: See above ? ?Physical Exam  ?BP 125/75 (BP Location: Left Arm)   Pulse 78   Temp 98.4 ?F (36.9 ?C) (Oral)   Resp 16   SpO2 99%  ?Gen:   Awake, no distress   ?Resp:  Normal effort  ?MSK:   Moves extremities without difficulty  ?Other:  Wrapped wounds over the bilateral lower extremities that are bleeding through bandages. ? ?Medical Decision Making  ?Medically screening exam initiated at 12:07 PM.  Appropriate orders placed.  Trooper Langrehr was informed that the remainder of the evaluation will be completed by another provider, this initial triage assessment does not replace that evaluation, and the importance of remaining in the ED until their evaluation is complete. ? ? ?  ?Hendricks Limes, PA-C ?09/05/21 1208 ? ?

## 2021-09-05 NOTE — ED Triage Notes (Signed)
BIB PTAR : Per EMS:  ?Pt coming from [facility] w/ C/o bilateral wounds to both legs.  ?Dark drainage from both legs w/ foul smell  ?VSS  ?

## 2021-09-05 NOTE — Assessment & Plan Note (Addendum)
Appreciate input by WOC ?Wounds do not appear actively infected ?WOC recs to ensure regular dressing changes ?

## 2021-09-05 NOTE — Assessment & Plan Note (Signed)
Encourage p.o. as tolerated ?

## 2021-09-05 NOTE — Assessment & Plan Note (Addendum)
CT scan demonstrating soft tissue edema with what appeared to be purulent material noted on dressings ?Patient recently treated with antibiotics and was seen by infectious disease and follow-up ?Pt was empirically started on vancomycin  ?On re-eval of CT, findings noted appear chronic.  ?Pt seen by WOC, wounds did not appear infected ?Blood cultures neg ?WOC to ensure routine dressing changes  ?Remained without leukocytosis ?

## 2021-09-05 NOTE — Assessment & Plan Note (Addendum)
Cont home meds as tolerated ?

## 2021-09-06 ENCOUNTER — Encounter: Payer: Self-pay | Admitting: Orthopedic Surgery

## 2021-09-06 DIAGNOSIS — L03116 Cellulitis of left lower limb: Secondary | ICD-10-CM | POA: Diagnosis not present

## 2021-09-06 LAB — CBC
HCT: 32.8 % — ABNORMAL LOW (ref 39.0–52.0)
Hemoglobin: 10.5 g/dL — ABNORMAL LOW (ref 13.0–17.0)
MCH: 27.9 pg (ref 26.0–34.0)
MCHC: 32 g/dL (ref 30.0–36.0)
MCV: 87.2 fL (ref 80.0–100.0)
Platelets: 286 10*3/uL (ref 150–400)
RBC: 3.76 MIL/uL — ABNORMAL LOW (ref 4.22–5.81)
RDW: 16.7 % — ABNORMAL HIGH (ref 11.5–15.5)
WBC: 7.5 10*3/uL (ref 4.0–10.5)
nRBC: 0 % (ref 0.0–0.2)

## 2021-09-06 LAB — COMPREHENSIVE METABOLIC PANEL
ALT: 10 U/L (ref 0–44)
AST: 14 U/L — ABNORMAL LOW (ref 15–41)
Albumin: 2.5 g/dL — ABNORMAL LOW (ref 3.5–5.0)
Alkaline Phosphatase: 69 U/L (ref 38–126)
Anion gap: 6 (ref 5–15)
BUN: 30 mg/dL — ABNORMAL HIGH (ref 8–23)
CO2: 25 mmol/L (ref 22–32)
Calcium: 8.3 mg/dL — ABNORMAL LOW (ref 8.9–10.3)
Chloride: 108 mmol/L (ref 98–111)
Creatinine, Ser: 0.95 mg/dL (ref 0.61–1.24)
GFR, Estimated: >60 mL/min (ref >60)
Glucose, Bld: 97 mg/dL (ref 70–99)
Potassium: 3.7 mmol/L (ref 3.5–5.1)
Sodium: 139 mmol/L (ref 135–145)
Total Bilirubin: 0.6 mg/dL (ref 0.3–1.2)
Total Protein: 5.9 g/dL — ABNORMAL LOW (ref 6.5–8.1)

## 2021-09-06 MED ORDER — TRIAMCINOLONE ACETONIDE 0.1 % EX CREA
2.0000 "application " | TOPICAL_CREAM | Freq: Every day | CUTANEOUS | Status: DC
  Administered 2021-09-06: 2 via TOPICAL
  Filled 2021-09-06 (×5): qty 15

## 2021-09-06 MED ORDER — HYDROCODONE-ACETAMINOPHEN 5-325 MG PO TABS: 1.0000 | ORAL_TABLET | Freq: Four times a day (QID) | ORAL | 0 refills | Status: DC | PRN

## 2021-09-06 NOTE — Consult Note (Signed)
WOC Nurse Consult Note: ?Patient receiving care in [place]. Daughter in room providing overview of course of care for the patients BLE wounds. ?Reason for Consult: BLE wounds ?Wound type: related to venous stasis. Most recently treated with daily application of compression wraps. Daughter tells me the daily dressing changes did not occur over the weekend, so when they were removed yesterday there was increased drainage, bleeding, and odor.  This finding of increased drainage, bleeding, and odor can occur when dressing changes do not occur, and may not necessarily represent infection. ?Pressure Injury POA: Yes/No/NA ?Measurement: deferred, patient was eating breakfast ?Wound bed: beefy red, clean with peripheral re-epithelialization at margins occurring ?Drainage (amount, consistency, odor) no odor ?Periwound: heavy crusted with thickened skin ?Dressing procedure/placement/frequency: ? ?Gently wash BLEs with soap and water, pat dry. After applying the Triamcinolone cream to the legs, cover the wound areas with Xeroform gauzes Kellie Simmering [phone]), then place ABD pads over those. Beginning behind the toes and going to just below the knees, spiral wrap kerlix then ace bandages. WHEN REMOVING THE OLD DRESSINGS, SATURATE WITH SALINE TO REMOVE.Daily application of 0000000 Triamcinolone cream was also ordered. ? ?I did not observe any overt s/s of infection in either leg. ? ?When the patient returns to his rehab facility, they should resume the compression therapy previously used. ? ?Monitor the wound area(s) for worsening of condition such as: ?Signs/symptoms of infection,  ?Increase in size,  ?Development of or worsening of odor, ?Development of pain, or increased pain at the affected locations.  Notify the medical team if any of these develop. ? ?Thank you for the consult.  Discussed plan of care with the patient and bedside nurse.  [facility] nurse will not follow at this time.  Please re-consult the [facility] team if needed. ? ?Val Riles,  RN, MSN, CWOCN, CNS-BC, pager [phone]  ?  ?

## 2021-09-06 NOTE — Care Management CC44 (Signed)
Condition Code 44 Documentation Completed ? ?Patient Details  ?Name: [name] [name] ?MRN: [id number] ?Date of Birth: [birth year] ? ? ?Condition Code 44 given:  Yes ?Patient signature on Condition Code 44 notice:  Yes ?Documentation of 2 MD's agreement:  Yes ?Code 44 added to claim:  Yes ? ? ? ?Barak Bialecki, LCSW ?09/06/2021, 1:57 PM ? ?

## 2021-09-06 NOTE — Hospital Course (Signed)
81yo with hx afib on anticoagulation, venous stasis, CAD, history of bilateral lower extremity wounds with prior cellulitis presenting with concerns for cellulitis involving left lower extremity, failing outpatient antibiotics.  Of note, patient is a very poor historian.  Patient reportedly was discharged recently on 08/18/2021 or patient was treated for sepsis with cellulitis.  Imaging on admission was notable for questionable evidence of osteomyelitis in right foot which was later determined not to be osteomyelitis.  Patient was treated with 1 additional week of oral antibiotics at time of discharge.  Patient was closely followed up with infectious disease on 08/31/2021.  That visit, patient was noted to have continued open wounds which needed ongoing wound care, overall improved. ?  ?At [facility], patient was noted to have worsening of left lower extremity wound with new drainage noted and foul smell.  Patient was subsequent referred to the emergency department.  ?  ?In emergency department, plain film of the left tibia/fibula was negative for osteomyelitis subsequent CT scan of the left lower extremity was performed, demonstrating edema suggestive of cellulitis.  No evidence of acute osteomyelitis seen on CT scan.  No evidence of abscess also seen on CT scan.  Given concerns of nonhealing wound with cellulitis, hospital service consulted for consideration for medical admission ? ?

## 2021-09-06 NOTE — TOC Transition Note (Signed)
Transition of Care (TOC) - CM/SW Discharge Note ? ? ?Patient Details  ?Name: [name] [name] ?MRN: [id number] ?Date of Birth: 1941-03-05 ? ?Transition of Care (TOC) CM/SW Contact:  ?Ellawyn Wogan, LCSW ?Phone Number: ?09/06/2021, 2:01 PM ? ? ?Clinical Narrative:    ?Pt medically cleared for return to [facility] today.  Pt and daughter aware and facility agreeable.  PTAR called @ 2:00pm.  RN to call report to [phone].  No further TOC needs. ? ? ?Final next level of care: [place] ?Barriers to Discharge: No Barriers Identified ? ? ?Patient Goals and CMS Choice ?  ?  ?  ? ?Discharge Placement ?  ?Existing PASRR number confirmed : 09/06/21          ?Patient [facility] bed at: [facility] ?Patient to be transferred to facility by: PTAR ?Name of family member notified: daughter ?Patient and family notified of of transfer: 09/06/21 ? ?Discharge Plan and Services ?  ?  ?           ?DME Arranged: N/A ?DME Agency: NA ?  ?  ?  ?  ?  ?  ?  ?  ? ?Social Determinants of Health (SDOH) Interventions ?  ? ? ?Readmission Risk Interventions ?   ? View : No data to display.  ?  ?  ?  ? ? ? ? ? ?

## 2021-09-06 NOTE — Progress Notes (Signed)
? ?Office Visit Note ?  ?Patient: [name] [name]           ?Date of Birth: 20-Jul-1940           ?MRN: [id number] ?Visit Date: 09/01/2021 ?             ?Requested by: Forrest Moron, MD ?[address] [employer] ?[address] ?[address] ?[place],  [place] [address] ?PCP: Forrest Moron, MD ? ?Chief Complaint  ?Patient presents with  ? Right Leg - Wound Check  ? Left Leg - Wound Check  ? ? ? ? ?HPI: ?Patient is an 81 year old gentleman who is seen for bilateral lower extremity venous stasis insufficiency.  Patient is status post MRI scan of both feet in April.  Patient is currently a resident at [facility].  His legs are dependent with swelling. ? ?Assessment & Plan: ?Visit Diagnoses:  ?1. Venous ulcer of both lower extremities with varicose veins (HCC)   ? ? ?Plan: Recommended elevation serial compression wraps for both lower extremities and protein supplements. ? ?Follow-Up Instructions: Return in about 4 weeks (around 09/29/2021).  ? ?Ortho Exam ? ?Patient is alert, oriented, no adenopathy, well-dressed, normal affect, normal respiratory effort. ?Examination patient has MRIs that showed no abscess or osteomyelitis in either foot.  Patient has massive venous stasis swelling with weeping edema for both lower extremities.  Patient is currently completed his antibiotics he was on Augmentin.  Currently on Eliquis.  The Doppler was used and he does have dampened monophasic dorsalis pedis pulse bilaterally.  Patient has been evaluated by vascular surgery and felt to have adequate arterial inflow. ? ?Imaging: ?DG Tibia/Fibula Left ? ?Result Date: 09/05/2021 ?CLINICAL DATA:  Cellulitis. Rule out subcutaneous gas or evidence of osteomyelitis. EXAM: LEFT TIBIA AND FIBULA - 2 VIEW COMPARISON:  08/08/2021 FINDINGS: No acute fracture or dislocation. No focal bony erosions identified to suggest osteomyelitis. Skin thickening and irregularity is identified likely reflecting sequelae of chronic venous stasis. Compared with the previous exam there is long  segment of linear increased lucency within medial subcutaneous soft tissues of the left lower extremity. Additionally, there is a small focal lucency within the lateral subcutaneous soft tissues. IMPRESSION: 1. No evidence for osteomyelitis. 2. Increase in lucency within the medial subcutaneous soft tissues of the left lower extremity. These findings are nonspecific but may be seen with underlying fluid collection. 3. Chronic venous stasis. Electronically Signed   By: Kerby Moors M.D.   On: 09/05/2021 13:35  ? ?DG Tibia/Fibula Right ? ?Result Date: 09/05/2021 ?CLINICAL DATA:  Cellulitis EXAM: RIGHT TIBIA AND FIBULA - 2 VIEW COMPARISON:  MRI 08/10/2021 FINDINGS: No acute fracture. No malalignment. No evidence of erosion or periosteal elevation. Bones are mildly demineralized. Mild generalized soft tissue edema. No soft tissue gas. IMPRESSION: 1. Mild generalized soft tissue edema. No soft tissue gas. 2. No acute osseous abnormality. Electronically Signed   By: Davina Poke D.O.   On: 09/05/2021 13:31  ? ?CT EXTREMITY LOWER LEFT W CONTRAST ? ?Result Date: 09/05/2021 ?CLINICAL DATA:  Soft tissue infection suspected of the lower leg. Radiographs shows concern for tracking fluid subcutaneously. Evaluate for abscess or necrotizing fasciitis. EXAM: CT OF THE LOWER LEFT EXTREMITY WITH CONTRAST TECHNIQUE: Multidetector CT imaging of the lower left extremity was performed according to the standard protocol following intravenous contrast administration. RADIATION DOSE REDUCTION: This exam was performed according to the departmental dose-optimization program which includes automated exposure control, adjustment of the mA and/or kV according to patient size and/or  use of iterative reconstruction technique. CONTRAST:  157mL OMNIPAQUE IOHEXOL 300 MG/ML  SOLN COMPARISON:  Left tibia and fibula radiographs 09/05/2021; right tibia and fibula radiographs for comparison; MRI bilateral tibiae and fibulae and bilateral feet 08/10/2021  FINDINGS: Bones/Joint/Cartilage There is diffuse decreased bone mineralization. Small anterior superior left femoral head-neck junction degenerative cyst. Mild-to-moderate patellofemoral joint space narrowing with mild to moderate patellar subchondral degenerative cysts. There is a lucent lesion with multiple thin internal sclerotic septations within the body and anterior process of the calcaneus measuring up to approximately 2.9 by 4.2 x 2.5 cm (transverse by AP by craniocaudal, sagittal series 9, image 83 and axial series 3, image 491). 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 Has benign features with narrow zone of transition and no cortical break/periosteal reaction. No acute fracture.  No cortical erosion. Ligaments Suboptimally assessed by CT. Muscles and Tendons Likely edema within the lateral calf musculature including the peroneus longus and brevis muscles. Normal size and density of the left thigh and calf musculature. Soft tissues There is mild edema and swelling within the proximal anteromedial calf. Moderate to high-grade edema and swelling of the more distal calf diffusely, and the dorsal aspect of the midfoot. No walled-off fluid collection is seen. No air tracking along fascial planes to indicate necrotizing fasciitis. High-grade stool within the rectum. IMPRESSION:: IMPRESSION: 1. There is again edema and swelling within the distal greater than proximal calf and the dorsal left midfoot. This is again greatest within the dorsal left midfoot and is overall similar to prior 08/10/2021 MRI which demonstrated diffuse bilateral distal calf and foot soft tissue swelling and edema. This may be secondary to systemic venous edema and/or cellulitis. This appears chronic. 2. No cortical erosion seen to indicate acute osteomyelitis. 3. No walled-off abscess is seen. 4. No air tracking along fascial planes to indicate necrotizing fasciitis. Electronically Signed   By: Yvonne Kendall M.D.    On: 09/05/2021 16:04   ?No images are attached to the encounter. ? ?Labs: ?Lab Results  ?Component Value Date  ? ESRSEDRATE 13 08/09/2021  ? ESRSEDRATE 12 08/09/2021  ? CRP 0.7 08/17/2021  ? CRP 7.3 (H) 08/09/2021  ? CRP 1.9 (H) 08/09/2021  ? REPTSTATUS PENDING 09/05/2021  ? CULT  09/05/2021  ?  NO GROWTH < 24 HOURS ?Performed at [facility], [place] [address], [place], [place] [address] ?  ? ? ? ?Lab Results  ?Component Value Date  ? ALBUMIN 2.5 (L) 09/06/2021  ? ALBUMIN 2.6 (L) 09/05/2021  ? ALBUMIN 2.0 (L) 08/13/2021  ? ? ?Lab Results  ?Component Value Date  ? MG 2.3 08/09/2021  ? MG 3.2 (H) 08/08/2021  ? ?No results found for: VD25OH ? ?No results found for: PREALBUMIN ? ?  Latest Ref Rng & Units 09/06/2021  ?  3:28 AM 09/05/2021  ? 12:08 PM 08/17/2021  ?  5:36 AM  ?CBC EXTENDED  ?WBC 4.0 - 10.5 K/uL 7.5   6.3   6.4    ?RBC 4.22 - 5.81 MIL/uL 3.76   3.92   3.42    ?Hemoglobin 13.0 - 17.0 g/dL 10.5   10.8   9.5    ?HCT 39.0 - 52.0 % 32.8   34.6   30.6    ?Platelets 150 - 400 K/uL 286   318   181    ?NEUT# 1.7 - 7.7 K/uL  4.2     ?Lymph# 0.7 - 4.0 K/uL  1.1     ? ? ? ?  There is no height or weight on file to calculate BMI. ? ?Orders:  ?No orders of the defined types were placed in this encounter. ? ?No orders of the defined types were placed in this encounter. ? ? ? Procedures: ?No procedures performed ? ?Clinical Data: ?No additional findings. ? ?ROS: ? ?All other systems negative, except as noted in the HPI. ?Review of Systems ? ?Objective: ?Vital Signs: There were no vitals taken for this visit. ? ?Specialty Comments:  ?No specialty comments available. ? ?PMFS History: ?Patient Active Problem List  ? Diagnosis Date Noted  ? Venous stasis dermatitis of both lower extremities   ? Open wound   ? Malnutrition of moderate degree 08/12/2021  ? Adjustment disorder with depressed mood   ? Renal failure 08/09/2021  ? Cellulitis   ? Sepsis with acute organ dysfunction without septic shock (HCC)   ? Moderate dementia with  psychotic disturbance ([place])   ? Acute renal failure ([place]) 08/08/2021  ? Hyperkalemia 08/08/2021  ? Hypermagnesemia 08/08/2021  ? Uremia 08/08/2021  ? Hyponatremia 08/08/2021  ? Coronary artery disease

## 2021-09-06 NOTE — Discharge Summary (Signed)
?Physician Discharge Summary ?  ?Patient: [name] [name] MRN: [id number] DOB: 12/17/1940  ?Admit date:     09/05/2021  ?Discharge date: 09/06/21  ?Discharge Physician: Rickey BarbaraStephen Aison Malveaux  ? ?PCP: Doristine BosworthStallings, Zoe A, MD  ? ?Recommendations at discharge:  ? ? Follow up with PCP in 1-2 weeks ?Per wound care recs: ?Gently wash BLEs with soap and water, pat dry. After applying the Triamcinolone cream to the legs, cover the wound areas with Xeroform gauzes [place] [phone]), then place ABD pads over those. Beginning behind the toes and going to just below the knees, spiral wrap kerlix then ace bandages. WHEN REMOVING THE OLD DRESSINGS, SATURATE WITH SALINE TO REMOVE.Daily application of 0.1% Triamcinolone cream ?Please resume the compression therapy previously used. ? ?Discharge Diagnoses: ?Principal Problem: ?  Cellulitis ?Active Problems: ?  Hyperlipidemia ?  Coronary artery disease involving native coronary artery of native heart without angina pectoris ?  Persistent atrial fibrillation (HCC) ?  Moderate dementia with psychotic disturbance (HCC) ?  Malnutrition of moderate degree ?  Venous stasis dermatitis of both lower extremities ?  Open wound ? ?Resolved Problems: ?  * No resolved hospital problems. * ? ?Hospital Course: ?81yo with hx afib on anticoagulation, venous stasis, CAD, history of bilateral lower extremity wounds with prior cellulitis presenting with concerns for cellulitis involving left lower extremity, failing outpatient antibiotics.  Of note, patient is a very poor historian.  Patient reportedly was discharged recently on 08/18/2021 or patient was treated for sepsis with cellulitis.  Imaging on admission was notable for questionable evidence of osteomyelitis in right foot which was later determined not to be osteomyelitis.  Patient was treated with 1 additional week of oral antibiotics at time of discharge.  Patient was closely followed up with infectious disease on 08/31/2021.  That visit, patient was noted to have  continued open wounds which needed ongoing wound care, overall improved. ?  ?At [facility], patient was noted to have worsening of left lower extremity wound with new drainage noted and foul smell.  Patient was subsequent referred to the emergency department.  ?  ?In emergency department, plain film of the left tibia/fibula was negative for osteomyelitis subsequent CT scan of the left lower extremity was performed, demonstrating edema suggestive of cellulitis.  No evidence of acute osteomyelitis seen on CT scan.  No evidence of abscess also seen on CT scan.  Given concerns of nonhealing wound with cellulitis, hospital service consulted for consideration for medical admission ? ? ?Assessment and Plan: ?* Cellulitis ?CT scan demonstrating soft tissue edema with what appeared to be purulent material noted on dressings ?Patient recently treated with antibiotics and was seen by infectious disease and follow-up ?Pt was empirically started on vancomycin  ?On re-eval of CT, findings noted appear chronic.  ?Pt seen by WOC, wounds did not appear infected ?Blood cultures neg ?WOC to ensure routine dressing changes  ?Remained without leukocytosis ? ?Open wound ?Appreciate input by WOC ?Wounds do not appear actively infected ?WOC recs to ensure regular dressing changes ? ?Venous stasis dermatitis of both lower extremities ?Consult wound care as per above ? ?Malnutrition of moderate degree ?Encourage p.o. as tolerated ? ?Moderate dementia with psychotic disturbance (HCC) ?Seems to be stable at this time ? ?Persistent atrial fibrillation (HCC) ?Presently rate controlled ?Continue Eliquis and home regimen ? ?Coronary artery disease involving native coronary artery of native heart without angina pectoris ?Denies chest pain at this time ?Continue home meds as tolerated ? ?Hyperlipidemia ?Cont home meds as tolerated ? ? ? ? ?  ? ? ?  Consultants:  ?Procedures performed:   ?Disposition: Skilled nursing facility ?Diet recommendation:   ?Cardiac diet ?DISCHARGE MEDICATION: ?Allergies as of 09/06/2021   ?No Known Allergies ?  ? ?  ?Medication List  ?  ? ?STOP taking these medications   ? ?amoxicillin-clavulanate 875-125 MG tablet ?Commonly known as: AUGMENTIN ?  ? ?  ? ?TAKE these medications   ? ?acetaminophen 500 MG tablet ?Commonly known as: TYLENOL ?Take 1,000 mg by mouth daily. ?  ?Advanced Probiotic 10 Caps ?Take 1 capsule by mouth daily. ?  ?apixaban 5 MG Tabs tablet ?Commonly known as: Eliquis ?Take 1 tablet (5 mg total) by mouth 2 (two) times daily. ?  ?aspirin EC 81 MG tablet ?Take 81 mg by mouth daily. Swallow whole. ?  ?atenolol 25 MG tablet ?Commonly known as: TENORMIN ?Take 25 mg by mouth daily as needed (if HR > 99 bpm afib). ?  ?atorvastatin 10 MG tablet ?Commonly known as: LIPITOR ?Take 10 mg by mouth daily. ?  ?ferrous sulfate 325 (65 FE) MG tablet ?Take 325 mg by mouth daily with breakfast. ?  ?HYDROcodone-acetaminophen 5-325 MG tablet ?Commonly known as: NORCO/VICODIN ?Take 1 tablet by mouth every 6 (six) hours as needed for moderate pain. ?  ?nutrition supplement (JUVEN) Pack ?Take 1 packet by mouth 2 (two) times daily between meals. ?  ?nystatin powder ?Commonly known as: MYCOSTATIN/NYSTOP ?Apply 1 application. topically 2 (two) times daily. Apply to groin area ?  ?vitamin C 500 MG tablet ?Commonly known as: ASCORBIC ACID ?Take 500 mg by mouth daily. ?  ?zinc gluconate 50 MG tablet ?Take 50 mg by mouth daily. ?  ? ?  ? ? ?Discharge Exam: ?Filed Weights  ? 09/05/21 1654 09/05/21 1854  ?Weight: 81.6 kg 79.6 kg  ? ?General exam: Awake, laying in bed, in nad ?Respiratory system: Normal respiratory effort, no wheezing ?Cardiovascular system: regular rate, s1, s2 ?Gastrointestinal system: Soft, nondistended, positive BS ?Central nervous system: CN2-12 grossly intact, strength intact ?Extremities: Perfused, no clubbing ?Skin: Normal skin turgor, open wound over L shin with good granulation, no active drainage ?Psychiatry: Mood normal  // no visual hallucinations  ? ?Condition at discharge: fair ? ?The results of significant diagnostics from this hospitalization (including imaging, microbiology, ancillary and laboratory) are listed below for reference.  ? ?Imaging Studies: ?DG Chest 1 View ? ?Result Date: 08/08/2021 ?CLINICAL DATA:  Atrial fibrillation, shortness of breath EXAM: CHEST  1 VIEW COMPARISON:  Chest radiograph dated March 12, 2005 FINDINGS: The heart size and mediastinal contours are within normal limits. Atherosclerotic calcification of aortic arch. Elevation of the left hemidiaphragm. Lungs otherwise clear without evidence of focal consolidation or large pleural effusion. Left costophrenic angle is not included. IMPRESSION: 1. Elevation of the left hemidiaphragm. Left costophrenic angle is not included. No focal consolidation or large pleural effusion. 2. Heart is normal in size. Atherosclerotic calcification of aortic arch. Electronically Signed   By: Larose Hires D.O.   On: 08/08/2021 22:43  ? ?DG Tibia/Fibula Left ? ?Result Date: 09/05/2021 ?CLINICAL DATA:  Cellulitis. Rule out subcutaneous gas or evidence of osteomyelitis. EXAM: LEFT TIBIA AND FIBULA - 2 VIEW COMPARISON:  08/08/2021 FINDINGS: No acute fracture or dislocation. No focal bony erosions identified to suggest osteomyelitis. Skin thickening and irregularity is identified likely reflecting sequelae of chronic venous stasis. Compared with the previous exam there is long segment of linear increased lucency within medial subcutaneous soft tissues of the left lower extremity. Additionally, there is a small focal lucency within the lateral  subcutaneous soft tissues. IMPRESSION: 1. No evidence for osteomyelitis. 2. Increase in lucency within the medial subcutaneous soft tissues of the left lower extremity. These findings are nonspecific but may be seen with underlying fluid collection. 3. Chronic venous stasis. Electronically Signed   By: Signa Kell M.D.   On: 09/05/2021  13:35  ? ?DG Tibia/Fibula Left ? ?Result Date: 08/08/2021 ?CLINICAL DATA:  Bilateral lower leg wounds. Necrosis and weeping wounds entire both tibia/fibula and feet. EXAM: LEFT TIBIA AND FIBULA - 2 VIEW COMPARIS

## 2021-09-06 NOTE — Care Management Obs Status (Signed)
MEDICARE OBSERVATION STATUS NOTIFICATION ? ? ?Patient Details  ?Name: [name] [name] ?MRN: [id number] ?Date of Birth: June 30, 1940 ? ? ?Medicare Observation Status Notification Given:  Yes ? ? ? ?Lema Heinkel, LCSW ?09/06/2021, 1:57 PM ?

## 2021-09-06 NOTE — NC FL2 (Signed)
?[place] MEDICAID FL2 LEVEL OF CARE SCREENING TOOL  ?  ? ?IDENTIFICATION  ?Patient Name: ?[name] [name] Birthdate: 09/11/1940 Sex: male Admission Date (Current Location): ?09/05/2021  ?[place] Number: ? [place] ?  Facility and Address:  ?[facility],  [address], [place] [address] ?     Provider Number: ?[id number]  ?Attending Physician Name and Address:  ?Jerald Kief, MD ? Relative Name and Phone Number:  ?daughter, Keston Seever [phone] ?   ?Current Level of Care: ?Hospital Recommended Level of Care: ?Skilled Nursing Facility Prior Approval Number: ?  ? ?Date Approved/Denied: ?  PASRR Number: ?[id number] H ? ?Discharge Plan: ?SNF ?  ? ?Current Diagnoses: ?Patient Active Problem List  ? Diagnosis Date Noted  ? Venous stasis dermatitis of both lower extremities   ? Open wound   ? Malnutrition of moderate degree 08/12/2021  ? Adjustment disorder with depressed mood   ? Renal failure 08/09/2021  ? Cellulitis   ? Sepsis with acute organ dysfunction without septic shock (HCC)   ? Moderate dementia with psychotic disturbance (HCC)   ? Acute renal failure (HCC) 08/08/2021  ? Hyperkalemia 08/08/2021  ? Hypermagnesemia 08/08/2021  ? Uremia 08/08/2021  ? Hyponatremia 08/08/2021  ? Coronary artery disease involving native coronary artery of native heart without angina pectoris 09/04/2016  ? Persistent atrial fibrillation (HCC) 09/04/2016  ? Hyperlipidemia   ? ? ?Orientation RESPIRATION BLADDER Height & Weight   ?  ?Self, Situation ? Normal Incontinent Weight: 175 lb 7.8 oz (79.6 kg) ?Height:  5\' 11"  (180.3 cm)  ?BEHAVIORAL SYMPTOMS/MOOD NEUROLOGICAL BOWEL NUTRITION STATUS  ?    Incontinent    ?AMBULATORY STATUS COMMUNICATION OF NEEDS Skin   ?Limited Assist Verbally Other (Comment) (LE cellulitis) ?  ?  ?  ?    ?     ?     ? ? ?Personal Care Assistance Level of Assistance  ?Bathing, Dressing Bathing Assistance: Limited assistance ?  ?Dressing Assistance: Limited assistance ?   ? ?Functional  Limitations Info  ?    ?  ?   ? ? ?SPECIAL CARE FACTORS FREQUENCY  ?PT (By licensed PT), OT (By licensed OT)   ?  ?PT Frequency: 5x/wk ?OT Frequency: 5x/wk ?  ?  ?  ?   ? ? ?Contractures Contractures Info: Not present  ? ? ?Additional Factors Info  ?Code Status, Allergies Code Status Info: Full ?Allergies Info: NKDA ?  ?  ?  ?   ? ?Current Medications (09/06/2021):  This is the current hospital active medication list ?Current Facility-Administered Medications  ?Medication Dose Route Frequency Provider Last Rate Last Admin  ? acetaminophen (TYLENOL) tablet 650 mg  650 mg Oral Q6H PRN 11/06/2021, MD   650 mg at 09/06/21 0836  ? Or  ? acetaminophen (TYLENOL) suppository 650 mg  650 mg Rectal Q6H PRN 11/06/21, MD      ? apixaban Jerald Kief) tablet 5 mg  5 mg Oral BID Everlene Balls, MD   5 mg at 09/06/21 11/06/21  ? ascorbic acid (VITAMIN C) tablet 500 mg  500 mg Oral Daily 3818, MD   500 mg at 09/06/21 11/06/21  ? aspirin EC tablet 81 mg  81 mg Oral Daily 2993, MD   81 mg at 09/06/21 11/06/21  ? atenolol (TENORMIN) tablet 25 mg  25 mg Oral Daily PRN 7169, MD      ? atorvastatin (LIPITOR) tablet 10 mg  10  mg Oral Daily Jerald Kief, MD   10 mg at 09/06/21 3825  ? ferrous sulfate tablet 325 mg  325 mg Oral Q breakfast Jerald Kief, MD   325 mg at 09/06/21 0539  ? HYDROcodone-acetaminophen (NORCO/VICODIN) 5-325 MG per tablet 1 tablet  1 tablet Oral Q6H PRN Jerald Kief, MD      ? morphine (PF) 2 MG/ML injection 2 mg  2 mg Intravenous Q4H PRN Jerald Kief, MD      ? nutrition supplement (JUVEN) (JUVEN) powder packet 1 packet  1 packet Oral BID BM Jerald Kief, MD   1 packet at 09/06/21 [phone]  ? triamcinolone cream (KENALOG) 0.1 % cream 2 application.  2 application. Topical Daily Jerald Kief, MD      ? vancomycin Luna Kitchens) IVPB 1500 mg/300 mL  1,500 mg Intravenous Q24H Gadhia, Jigna M, RPH      ? zinc sulfate capsule 220 mg  220 mg Oral Daily Jerald Kief, MD   220 mg  at 09/06/21 4193  ? ? ? ?Discharge Medications: ?Please see discharge summary for a list of discharge medications. ? ?Relevant Imaging Results: ? ?Relevant Lab Results: ? ? ?Additional Information ?SS# [id number] ? ?Deacon Gadbois, LCSW ? ? ? ? ?

## 2021-09-10 LAB — CULTURE, BLOOD (ROUTINE X 2)
Culture: NO GROWTH
Culture: NO GROWTH
Special Requests: ADEQUATE
Special Requests: ADEQUATE

## 2021-09-25 ENCOUNTER — Encounter (HOSPITAL_COMMUNITY): Payer: Self-pay | Admitting: Emergency Medicine

## 2021-09-25 ENCOUNTER — Other Ambulatory Visit: Payer: Self-pay

## 2021-09-25 ENCOUNTER — Emergency Department (HOSPITAL_COMMUNITY)
Admission: EM | Admit: 2021-09-25 | Discharge: 2021-09-25 | Disposition: A | Discharge status: Home / Self Care | Payer: Medicare Other | Attending: Emergency Medicine | Admitting: Emergency Medicine

## 2021-09-25 DIAGNOSIS — Z4800 Encounter for change or removal of nonsurgical wound dressing: Secondary | ICD-10-CM | POA: Insufficient documentation

## 2021-09-25 DIAGNOSIS — I4891 Unspecified atrial fibrillation: Secondary | ICD-10-CM | POA: Diagnosis not present

## 2021-09-25 DIAGNOSIS — I2510 Atherosclerotic heart disease of native coronary artery without angina pectoris: Secondary | ICD-10-CM | POA: Insufficient documentation

## 2021-09-25 DIAGNOSIS — Z7901 Long term (current) use of anticoagulants: Secondary | ICD-10-CM | POA: Insufficient documentation

## 2021-09-25 DIAGNOSIS — Z5189 Encounter for other specified aftercare: Secondary | ICD-10-CM

## 2021-09-25 DIAGNOSIS — Z7982 Long term (current) use of aspirin: Secondary | ICD-10-CM | POA: Diagnosis not present

## 2021-09-25 NOTE — Discharge Instructions (Signed)
Your history and exam today are consistent with some residual bleeding from your chronic wounds.  On our initial inspection, there did not appear to be further bleeding.  We had a long shared decision-making conversation both with you and your family and agree that as it is not appearing to bleed further, we feel it is safe to leave the dressing in place and slightly overwrap it.  You continue to have good sensation and strength in your toes and had good capillary refill.  We did not see further bleeding after over 2 hours of observation.  Based on your lack of other preceeding symptoms we feel safe with discharge you home.  Please follow-up with your wound team and primary team and if any symptoms change or worsen, please return to the nearest emergency department

## 2021-09-25 NOTE — ED Notes (Signed)
Discharge paperwork reviewed with pt and family. Both parties showed verbal understanding of paperwork. Will call [facility] for pt. Transportation to [facility].

## 2021-09-25 NOTE — ED Notes (Signed)
Report called to [facility] house to notify of pt. Arrival. Spoke with Mia at [facility].

## 2021-09-25 NOTE — ED Notes (Signed)
[facility] called for pt. Transportation to [facility].

## 2021-09-25 NOTE — ED Triage Notes (Addendum)
Pt BIB [facility] from [facility] w/ c/o wounds in bilateral legs. Bleeding to right lower leg. Pt is on blood thinner 112palp  80 HR

## 2021-09-25 NOTE — ED Provider Notes (Signed)
[facility] DEPT Provider Note   CSN: [id number] Arrival date & time: 09/25/21  1639     History  Chief Complaint  Patient presents with   Wound Check    [name] [name] is a 81 y.o. male.  The history is provided by the patient and medical records. No language interpreter was used.  Wound Check This is a chronic problem. The problem occurs constantly. The problem has not changed since onset.Pertinent negatives include no chest pain, no abdominal pain, no headaches and no shortness of breath. Nothing aggravates the symptoms. Nothing relieves the symptoms. He has tried nothing for the symptoms. The treatment provided no relief.      Home Medications Prior to Admission medications   Medication Sig Start Date End Date Taking? Authorizing Provider  acetaminophen (TYLENOL) 500 MG tablet Take 1,000 mg by mouth daily.    [provider]  apixaban (ELIQUIS) 5 MG TABS tablet Take 1 tablet (5 mg total) by mouth 2 (two) times daily. 08/18/21   Oswald Hillock, MD  aspirin EC 81 MG tablet Take 81 mg by mouth daily. Swallow whole.    [provider]  atenolol (TENORMIN) 25 MG tablet Take 25 mg by mouth daily as needed (if HR > 99 bpm afib).    [provider]  atorvastatin (LIPITOR) 10 MG tablet Take 10 mg by mouth daily. 09/02/21   [provider]  ferrous sulfate 325 (65 FE) MG tablet Take 325 mg by mouth daily with breakfast.    [provider]  HYDROcodone-acetaminophen (NORCO/VICODIN) 5-325 MG tablet Take 1 tablet by mouth every 6 (six) hours as needed for moderate pain. 09/06/21   Donne Hazel, MD  nutrition supplement, JUVEN, Fanny Dance) PACK Take 1 packet by mouth 2 (two) times daily between meals. 08/18/21   Oswald Hillock, MD  nystatin (MYCOSTATIN/NYSTOP) powder Apply 1 application. topically 2 (two) times daily. Apply to groin area    [provider]  Probiotic Product (ADVANCED PROBIOTIC 10) CAPS Take 1 capsule by  mouth daily.    [provider]  vitamin C (ASCORBIC ACID) 500 MG tablet Take 500 mg by mouth daily.    [provider]  zinc gluconate 50 MG tablet Take 50 mg by mouth daily.    [provider]      Allergies    Patient has no known allergies.    Review of Systems   Review of Systems  Constitutional:  Negative for chills, fatigue and fever.  HENT:  Negative for congestion.   Eyes:  Negative for visual disturbance.  Respiratory:  Negative for cough, chest tightness, shortness of breath and wheezing.   Cardiovascular:  Negative for chest pain, palpitations and leg swelling (at baseline per pt).  Gastrointestinal:  Negative for abdominal pain, constipation, diarrhea, nausea and vomiting.  Genitourinary:  Negative for flank pain.  Musculoskeletal:  Negative for back pain.  Skin:  Positive for wound (chronic). Negative for color change and rash.  Neurological:  Negative for light-headedness and headaches.  Psychiatric/Behavioral:  Negative for agitation.   All other systems reviewed and are negative.  Physical Exam Updated Vital Signs BP 122/64   Pulse 78   Temp 98.6 F (37 C) (Oral)   Resp 19   SpO2 100%  Physical Exam Vitals and nursing note reviewed.  Constitutional:      General: He is not in acute distress.    Appearance: He is well-developed. He is not ill-appearing, toxic-appearing or diaphoretic.  HENT:     Head: Normocephalic and atraumatic.     Nose: No congestion or rhinorrhea.     Mouth/Throat:     Mouth: Mucous membranes are moist.  Eyes:     Extraocular Movements: Extraocular movements intact.     Conjunctiva/sclera: Conjunctivae normal.     Pupils: Pupils are equal, round, and reactive to light.  Cardiovascular:     Rate and Rhythm: Normal rate and regular rhythm.     Heart sounds: No murmur heard. Pulmonary:     Effort: Pulmonary effort is normal. No respiratory distress.     Breath sounds: Normal breath sounds. No wheezing,  rhonchi or rales.  Chest:     Chest wall: No tenderness.  Abdominal:     General: Abdomen is flat.     Palpations: Abdomen is soft.     Tenderness: There is no abdominal tenderness. There is no guarding or rebound.  Musculoskeletal:        General: No swelling or tenderness.     Cervical back: Neck supple.     Comments: Bilateral legs are dressed and wound wraps.  Right side has no bleeding.  Left side has some dried blood in the mid calf area.  No further bleeding seen.  Denies any tenderness.  Intact sensation in feet and good cap refill.  He does not want [place] to take down wound dressing as it is not still bleeding.  Skin:    General: Skin is warm.     Capillary Refill: Capillary refill takes less than 2 seconds.  Neurological:     General: No focal deficit present.     Mental Status: He is alert.     Sensory: No sensory deficit.     Motor: No weakness.  Psychiatric:        Mood and Affect: Mood normal.    ED Results / Procedures / Treatments   Labs (all labs ordered are listed, but only abnormal results are displayed) Labs Reviewed - No data to display  EKG None  Radiology No results found.  Procedures Procedures    Medications Ordered in ED Medications - No data to display  ED Course/ Medical Decision Making/ A&P                           Medical Decision Making   [name] [name] is a 81 y.o. male with a past medical history significant for CAD, hyperlipidemia, persistent atrial fibrillation on Eliquis therapy, and recent admission for wounds to bilateral legs who presents with left leg bleeding.  According to patient, patient had bleeding today coming from his posterior mid calf on the left leg seeping through his dressing that was changed 2 days ago.  He reports no worsening pain and denies any evidence of infection.  He denies fevers, chills, [name], cough, nausea, vomiting, constipation, diarrhea.  Denies any leg pain or leg swelling otherwise.  Reports normal  sensation in his feet.  He says that it bled on the floor and then he was sent here for evaluation however he says that before arriving the bleeding is stopped and he is not concerned about it at this time.  On my exam, lungs clear and chest nontender.  Abdomen nontender.  Patient's legs are bilaterally wrapped with some dried blood on the left calf area.  No tenderness present.  He has sensation in toes with normal capillary refill.  No tenderness in the knee or more proximally.  He does not have any bleeding seen from the right leg and he is not appearing to further soak through the leg.  Had a shared decision-making conversation with the patient.  We offered to cut off the left leg dressing to determine if needed further wrapping however patient would prefer not to have it cut off as it does not appear to be bleeding currently.  He would rather watch it for a period of time and potentially slightly overwrap it to be able to tell with a source of bleed further but would prefer not cutting it off.  As he was anterior to stop bleeding and it does not appear to be further bleeding, I did not agree with patient to hold on dressing removal as it is not bleeding through now.  We will watch for period of time and then reassess.  If it starts to soak through further or actively bleed, will likely fully take down the dressing and assess.  When I saw this patient several weeks ago and admitted him for infection, he did have bleeding from his legs bilaterally due to his Eliquis use.  I am concerned that taking the dressing down now while is hemostatic would cause further bleeding.  Anticipate reassessment after monitoring.  After observation and slight over dressing, patient had no further bleeding seeping through.  He continues to be symptom-free and he still does not want [place] to take on the dressing.  After discussion with family, they agree.  We will discharge patient home for outpatient follow-up with his  wound team tomorrow.         Final Clinical Impression(s) / ED Diagnoses Final diagnoses:  Visit for wound check    Rx / DC Orders ED Discharge Orders     None       Clinical Impression: 1. Visit for wound check     Disposition: Discharge  Condition: Good  I have discussed the results, Dx and Tx plan with the pt(& family if present). He/she/they expressed understanding and agree(s) with the plan. Discharge instructions discussed at great length. Strict return precautions discussed and pt &/or family have verbalized understanding of the instructions. No further questions at time of discharge.    New Prescriptions   No medications on file    Follow Up: Forrest Moron, MD [phone] [place] [id number] [phone]     your wound team         Calise Dunckel, Gwenyth Allegra, MD 09/25/21 [phone]

## 2021-10-20 ENCOUNTER — Encounter: Payer: Self-pay | Admitting: Cardiovascular Disease

## 2021-10-20 ENCOUNTER — Ambulatory Visit (INDEPENDENT_AMBULATORY_CARE_PROVIDER_SITE_OTHER): Payer: Medicare Other | Admitting: Cardiovascular Disease

## 2021-10-20 VITALS — BP 126/68 | HR 61 | Ht 71.0 in | Wt 171.0 lb

## 2021-10-20 DIAGNOSIS — I4821 Permanent atrial fibrillation: Secondary | ICD-10-CM | POA: Diagnosis not present

## 2021-10-20 NOTE — Progress Notes (Signed)
Cardiology Office Note:    Date:  10/20/2021   ID:  Deylin Adley, DOB May 30, 1940, MRN [id number]  PCP:  Forrest Moron, MD   [facility] Providers Cardiologist:  Sherren Mocha, MD     Referring MD: Forrest Moron, MD   Chief Complaint  Patient presents with   Coronary Artery Disease    History of Present Illness:    [name] [name] is a 81 y.o. male with a hx of permanent atrial fibrillation, presenting for follow-up evaluation.  The patient is anticoagulated with apixaban and has tolerated this well.  He has had a lot of problems with medication intolerances over the years.  He has not tolerated beta-blockers or calcium channel blockers well.  The patient was hospitalized this spring with problems related to his chronic leg edema, venous ulcers, and sepsis.  He has done really well per his daughter's report since his hospitalization.  He is living at [facility] and he has had excellent wound care per her report.  His legs are both wrapped at the time of my evaluation today.  The patient denies any chest pain, chest pressure, shortness of breath, or heart palpitations.  He is able to ambulate short distances with a walker.  Past Medical History:  Diagnosis Date   CAD (coronary artery disease)    LHC 11/06: pLAD 90, pD1 24, oD2 25 with inf branch 60; septal perf ostial 95; prox MOM 25, RCA 25, oPL2 95, EF 65 >> PCI:  BMS to proximal LAD    FH: CAD (coronary artery disease)    History of echocardiogram    Echo 3/17:  Mod LVH, EF 55-60, no RWMA, mild AI, mild to mod RAE   History of tobacco abuse    Hyperlipidemia    Persistent atrial fibrillation ([place])    Xarelto for a/c    Past Surgical History:  Procedure Laterality Date   CARDIAC CATHETERIZATION     CORONARY STENT PLACEMENT     stents of the proximal lad with bare metal stent   PERCUTANEOUS CORONARY STENT INTERVENTION (PCI-S)      Current Medications: Current Meds  Medication Sig   acetaminophen (TYLENOL) 500 MG  tablet Take 1,000 mg by mouth daily.   apixaban (ELIQUIS) 5 MG TABS tablet Take 1 tablet (5 mg total) by mouth 2 (two) times daily.   atenolol (TENORMIN) 25 MG tablet Take 25 mg by mouth daily as needed (if HR > 99 bpm afib).   atorvastatin (LIPITOR) 10 MG tablet Take 10 mg by mouth daily.   ferrous sulfate 325 (65 FE) MG tablet Take 325 mg by mouth daily with breakfast.   HYDROcodone-acetaminophen (NORCO/VICODIN) 5-325 MG tablet Take 1 tablet by mouth every 6 (six) hours as needed for moderate pain.   nutrition supplement, JUVEN, (JUVEN) PACK Take 1 packet by mouth 2 (two) times daily between meals.   nystatin (MYCOSTATIN/NYSTOP) powder Apply 1 application. topically 2 (two) times daily. Apply to groin area   Probiotic Product (ADVANCED PROBIOTIC 10) CAPS Take 1 capsule by mouth daily.   vitamin C (ASCORBIC ACID) 500 MG tablet Take 500 mg by mouth daily.   zinc gluconate 50 MG tablet Take 50 mg by mouth daily.   [DISCONTINUED] aspirin EC 81 MG tablet Take 81 mg by mouth daily. Swallow whole.     Allergies:   Patient has no known allergies.   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  Tobacco Use   Smoking status: Former    Types: Cigarettes    Quit date: 05/10/1978    Years since quitting: 43.4   Smokeless tobacco: Never  Vaping Use   Vaping Use: Never used  Substance and Sexual Activity   Alcohol use: No   Drug use: No   Sexual activity: Not on file  Other Topics Concern   Not on file  Social History Narrative   Not on file   Social Determinants of Health   Financial Resource Strain: Not on file  Food Insecurity: Not on file  Transportation Needs: Not on file  Physical Activity: Not on file  Stress: Not on file  Social Connections:  Not on file     Family History: The patient's family history includes Cancer - Colon in his mother; Heart attack in his father; Hypertension in his father.  ROS:   Please see the history of present illness.    All other systems reviewed and are negative.  EKGs/Labs/Other Studies Reviewed:    The following studies were reviewed today: Echo 08/10/2021: 1. Left ventricular ejection fraction, by estimation, is 60 to 65%. The  left ventricle has normal function. The left ventricle has no regional  wall motion abnormalities. Left ventricular diastolic function could not  be evaluated.   2. Right ventricular systolic function is normal. The right ventricular  size is normal. There is normal pulmonary artery systolic pressure.   3. Left atrial size was mildly dilated.   4. The mitral valve is normal in structure. Trivial mitral valve  regurgitation. No evidence of mitral stenosis.   5. The aortic valve is tricuspid. Aortic valve regurgitation is mild.  Aortic valve sclerosis/calcification is present, without any evidence of  aortic stenosis. Aortic regurgitation PHT measures 453 msec.   6. Aortic dilatation noted. There is mild dilatation of the aortic root,  measuring 41 mm. There is mild dilatation of the ascending aorta,  measuring 38 mm.   7. The inferior vena cava is normal in size with greater than 50%  respiratory variability, suggesting right atrial pressure of 3 mmHg.   EKG:  EKG is not ordered today.    Recent Labs: 03/15/2021: TSH 3.650 08/08/2021: B Natriuretic Peptide 39.3 08/09/2021: Magnesium 2.3 09/06/2021: ALT 10; BUN 30; Creatinine, Ser 0.95; Hemoglobin 10.5; Platelets 286; Potassium 3.7; Sodium 139  Recent Lipid Panel    Component Value Date/Time   CHOL 158 03/15/2021 0907   TRIG 55 03/15/2021 0907   HDL 58 03/15/2021 0907   CHOLHDL 2.7 03/15/2021 0907   CHOLHDL 3 08/02/2015 1055   VLDL 10.0 08/02/2015 1055   LDLCALC 89 03/15/2021 0907   LDLDIRECT 190.0  01/23/2008 1110     Risk Assessment/Calculations:    CHA2DS2-VASc Score = 3   This indicates a 3.2% annual risk of stroke. The patient's score is based upon: CHF History: 0 HTN History: 0 Diabetes History: 0 Stroke History: 0 Vascular Disease History: 1 Age Score: 2 Gender Score: 0          Physical Exam:    VS:  BP 126/68   Pulse 61   Ht 5\' 11"  (1.803 m)   Wt 171 lb (77.6 kg)   SpO2 99%   BMI 23.85 kg/m     Wt Readings  from Last 3 Encounters:  10/20/21 171 lb (77.6 kg)  09/05/21 175 lb 7.8 oz (79.6 kg)  08/15/21 176 lb 2.4 oz (79.9 kg)     GEN: Elderly male, in a wheelchair, in no acute distress HEENT: Normal NECK: No JVD; No carotid bruits LYMPHATICS: No lymphadenopathy CARDIAC: Irregularly irregular, no murmurs, rubs, gallops RESPIRATORY:  Clear to auscultation without rales, wheezing or rhonchi  ABDOMEN: Soft, non-tender, non-distended MUSCULOSKELETAL: Both legs are wrapped; No deformity  SKIN: Warm and dry NEUROLOGIC:  Alert and oriented x 3 PSYCHIATRIC:  Normal affect   ASSESSMENT:    1. Permanent atrial fibrillation (HCC)    PLAN:    In order of problems listed above:  The patient is stable with respect to his atrial fibrillation.  He has permanent atrial fibrillation and is tolerating oral anticoagulation.  I reviewed recent vital signs from the skilled nursing facility and his heart rate has been well controlled, ranging from 66- 92 bpm.  He only takes atenolol on an as-needed basis for heart rate greater than 100, because he has had some intolerances over time to beta-blockers and calcium channel blockers.  He will continue on apixaban 5 mg twice daily.  We reviewed the dosing requirements of apixaban and he is appropriate for the 5 mg dose with age 60 years old, normal renal function, and weight greater than 60 kg.  I reviewed his medications from the [facility] at his skilled nursing facility and have recommended that he discontinue aspirin in the setting  of chronic oral anticoagulation with apixaban.  The patient will stop aspirin.     Medication Adjustments/Labs and Tests Ordered: Current medicines are reviewed at length with the patient today.  Concerns regarding medicines are outlined above.  No orders of the defined types were placed in this encounter.  No orders of the defined types were placed in this encounter.   Patient Instructions  Medication Instructions:  STOP Aspirin *If you need a refill on your cardiac medications before your next appointment, please call your pharmacy*   Lab Work: NONE If you have labs (blood work) drawn today and your tests are completely normal, you will receive your results only by: MyChart Message (if you have MyChart) OR A paper copy in the mail If you have any lab test that is abnormal or we need to change your treatment, we will call you to review the results.   Testing/Procedures: NONE  Follow-Up: At [facility], you and your health needs are our priority.  As part of our continuing mission to provide you with exceptional heart care, we have created designated Provider Care Teams.  These Care Teams include your primary Cardiologist (physician) and Advanced Practice Providers (APPs -  Physician Assistants and Nurse Practitioners) who all work together to provide you with the care you need, when you need it.  Your next appointment:   1 year(s)  The format for your next appointment:   In Person  Provider:   Tonny Bollman, MD       Important Information About Sugar         Signed, Tonny Bollman, MD  10/20/2021 5:39 PM    [facility]

## 2021-10-20 NOTE — Patient Instructions (Signed)
Medication Instructions:  STOP Aspirin *If you need a refill on your cardiac medications before your next appointment, please call your pharmacy*   Lab Work: NONE If you have labs (blood work) drawn today and your tests are completely normal, you will receive your results only by: MyChart Message (if you have MyChart) OR A paper copy in the mail If you have any lab test that is abnormal or we need to change your treatment, we will call you to review the results.   Testing/Procedures: NONE  Follow-Up: At [facility], you and your health needs are our priority.  As part of our continuing mission to provide you with exceptional heart care, we have created designated Provider Care Teams.  These Care Teams include your primary Cardiologist (physician) and Advanced Practice Providers (APPs -  Physician Assistants and Nurse Practitioners) who all work together to provide you with the care you need, when you need it.  Your next appointment:   1 year(s)  The format for your next appointment:   In Person  Provider:   Tonny Bollman, MD       Important Information About Sugar

## 2021-10-25 ENCOUNTER — Ambulatory Visit (INDEPENDENT_AMBULATORY_CARE_PROVIDER_SITE_OTHER): Payer: Medicare Other | Admitting: Podiatry

## 2021-10-25 ENCOUNTER — Encounter: Payer: Self-pay | Admitting: Podiatry

## 2021-10-25 DIAGNOSIS — N179 Acute kidney failure, unspecified: Secondary | ICD-10-CM | POA: Diagnosis not present

## 2021-10-25 DIAGNOSIS — I872 Venous insufficiency (chronic) (peripheral): Secondary | ICD-10-CM

## 2021-10-25 DIAGNOSIS — M79675 Pain in left toe(s): Secondary | ICD-10-CM

## 2021-10-25 DIAGNOSIS — M79674 Pain in right toe(s): Secondary | ICD-10-CM

## 2021-10-25 DIAGNOSIS — N189 Chronic kidney disease, unspecified: Secondary | ICD-10-CM

## 2021-10-25 DIAGNOSIS — L03119 Cellulitis of unspecified part of limb: Secondary | ICD-10-CM

## 2021-10-25 DIAGNOSIS — B351 Tinea unguium: Secondary | ICD-10-CM | POA: Diagnosis not present

## 2021-10-25 NOTE — Progress Notes (Signed)
This patient returns to my office for at risk foot care.  This patient requires this care by a professional since this patient will be at risk due to having kidney failure coagulation defect and cellulitis.  He presents to the office in a wheelchair wearing bandages on both lower legs/feet.  This patient is unable to cut nails himself since the patient cannot reach his nails.These nails are painful walking and wearing shoes.  This patient presents for at risk foot care today.  General Appearance  Alert, conversant and in no acute stress.  Vascular  Deferred  Neurologic  Deferred   Nails Thick disfigured discolored nails with subungual debris  from hallux to fifth toes bilaterally. No evidence of bacterial infection or drainage bilaterally.  Orthopedic  No limitations of motion  feet .  No crepitus or effusions noted.  No bony pathology or digital deformities noted.  Skin  normotropic skin with no porokeratosis noted bilaterally.  No signs of infections or ulcers noted.     Onychomycosis  Pain in right toes  Pain in left toes  Consent was obtained for treatment procedures.   Mechanical debridement of nails 1-5  bilaterally performed with a nail nipper.  Filed with dremel without incident.    Return office visit   3 months                   Told patient to return for periodic foot care and evaluation due to potential at risk complications.   Helane Gunther DPM

## 2022-02-13 ENCOUNTER — Ambulatory Visit: Payer: Medicare Other | Admitting: Podiatry

## 2022-02-15 ENCOUNTER — Encounter: Payer: Self-pay | Admitting: Podiatry

## 2022-02-15 ENCOUNTER — Ambulatory Visit (INDEPENDENT_AMBULATORY_CARE_PROVIDER_SITE_OTHER): Payer: Medicare Other | Admitting: Podiatry

## 2022-02-15 DIAGNOSIS — B351 Tinea unguium: Secondary | ICD-10-CM

## 2022-02-15 DIAGNOSIS — M79675 Pain in left toe(s): Secondary | ICD-10-CM | POA: Diagnosis not present

## 2022-02-15 DIAGNOSIS — M79674 Pain in right toe(s): Secondary | ICD-10-CM

## 2022-02-15 DIAGNOSIS — N179 Acute kidney failure, unspecified: Secondary | ICD-10-CM

## 2022-02-15 DIAGNOSIS — N189 Chronic kidney disease, unspecified: Secondary | ICD-10-CM

## 2022-02-15 NOTE — Progress Notes (Signed)
This patient returns to my office for at risk foot care.  This patient requires this care by a professional since this patient will be at risk due to having kidney failure coagulation defect .   This patient is unable to cut nails himself since the patient cannot reach his nails.These nails are painful walking and wearing shoes. He presents to the office with his daughter.   This patient presents for at risk foot care today.  General Appearance  Alert, conversant and in no acute stress.  Vascular  Dorsalis pedis and posterior tibial pulses are absent due to swelling    Neurologic  LOPS diminished.    Nails Thick disfigured discolored nails with subungual debris  from hallux to fifth toes bilaterally. No evidence of bacterial infection or drainage bilaterally.  Orthopedic  No limitations of motion  feet .  No crepitus or effusions noted.  No bony pathology or digital deformities noted.  Skin  normotropic skin with no porokeratosis noted bilaterally.  No signs of infections or ulcers noted.     Onychomycosis  Pain in right toes  Pain in left toes  Consent was obtained for treatment procedures.   Mechanical debridement of nails 1-5  bilaterally performed with a nail nipper.  Filed with dremel without incident.    Return office visit   3 months                   Told patient to return for periodic foot care and evaluation due to potential at risk complications.   Gardiner Barefoot DPM

## 2022-05-23 DIAGNOSIS — G309 Alzheimer's disease, unspecified: Secondary | ICD-10-CM | POA: Diagnosis not present

## 2022-05-23 DIAGNOSIS — F419 Anxiety disorder, unspecified: Secondary | ICD-10-CM | POA: Diagnosis not present

## 2022-05-23 DIAGNOSIS — F330 Major depressive disorder, recurrent, mild: Secondary | ICD-10-CM | POA: Diagnosis not present

## 2022-05-23 DIAGNOSIS — F0280 Dementia in other diseases classified elsewhere without behavioral disturbance: Secondary | ICD-10-CM | POA: Diagnosis not present

## 2022-05-24 DIAGNOSIS — L821 Other seborrheic keratosis: Secondary | ICD-10-CM | POA: Diagnosis not present

## 2022-05-24 DIAGNOSIS — L579 Skin changes due to chronic exposure to nonionizing radiation, unspecified: Secondary | ICD-10-CM | POA: Diagnosis not present

## 2022-05-24 DIAGNOSIS — L814 Other melanin hyperpigmentation: Secondary | ICD-10-CM | POA: Diagnosis not present

## 2022-05-24 DIAGNOSIS — L570 Actinic keratosis: Secondary | ICD-10-CM | POA: Diagnosis not present

## 2022-05-26 ENCOUNTER — Ambulatory Visit: Payer: Medicare Other | Admitting: Podiatry

## 2022-05-31 DIAGNOSIS — F419 Anxiety disorder, unspecified: Secondary | ICD-10-CM | POA: Diagnosis not present

## 2022-05-31 DIAGNOSIS — I482 Chronic atrial fibrillation, unspecified: Secondary | ICD-10-CM | POA: Diagnosis not present

## 2022-06-05 DIAGNOSIS — E782 Mixed hyperlipidemia: Secondary | ICD-10-CM | POA: Diagnosis not present

## 2022-06-05 DIAGNOSIS — I872 Venous insufficiency (chronic) (peripheral): Secondary | ICD-10-CM | POA: Diagnosis not present

## 2022-06-05 DIAGNOSIS — L308 Other specified dermatitis: Secondary | ICD-10-CM | POA: Diagnosis not present

## 2022-06-05 DIAGNOSIS — I4891 Unspecified atrial fibrillation: Secondary | ICD-10-CM | POA: Diagnosis not present

## 2022-06-08 DIAGNOSIS — I2510 Atherosclerotic heart disease of native coronary artery without angina pectoris: Secondary | ICD-10-CM | POA: Diagnosis not present

## 2022-06-08 DIAGNOSIS — F4321 Adjustment disorder with depressed mood: Secondary | ICD-10-CM | POA: Diagnosis not present

## 2022-06-08 DIAGNOSIS — Z9181 History of falling: Secondary | ICD-10-CM | POA: Diagnosis not present

## 2022-06-08 DIAGNOSIS — E440 Moderate protein-calorie malnutrition: Secondary | ICD-10-CM | POA: Diagnosis not present

## 2022-06-08 DIAGNOSIS — E7849 Other hyperlipidemia: Secondary | ICD-10-CM | POA: Diagnosis not present

## 2022-06-08 DIAGNOSIS — Z87891 Personal history of nicotine dependence: Secondary | ICD-10-CM | POA: Diagnosis not present

## 2022-06-08 DIAGNOSIS — N179 Acute kidney failure, unspecified: Secondary | ICD-10-CM | POA: Diagnosis not present

## 2022-06-08 DIAGNOSIS — I890 Lymphedema, not elsewhere classified: Secondary | ICD-10-CM | POA: Diagnosis not present

## 2022-06-08 DIAGNOSIS — I4819 Other persistent atrial fibrillation: Secondary | ICD-10-CM | POA: Diagnosis not present

## 2022-06-08 DIAGNOSIS — F0390 Unspecified dementia without behavioral disturbance: Secondary | ICD-10-CM | POA: Diagnosis not present

## 2022-06-08 DIAGNOSIS — I87323 Chronic venous hypertension (idiopathic) with inflammation of bilateral lower extremity: Secondary | ICD-10-CM | POA: Diagnosis not present

## 2022-06-08 DIAGNOSIS — Z7901 Long term (current) use of anticoagulants: Secondary | ICD-10-CM | POA: Diagnosis not present

## 2022-06-13 DIAGNOSIS — I4819 Other persistent atrial fibrillation: Secondary | ICD-10-CM | POA: Diagnosis not present

## 2022-06-13 DIAGNOSIS — E7849 Other hyperlipidemia: Secondary | ICD-10-CM | POA: Diagnosis not present

## 2022-06-13 DIAGNOSIS — F0390 Unspecified dementia without behavioral disturbance: Secondary | ICD-10-CM | POA: Diagnosis not present

## 2022-06-13 DIAGNOSIS — E440 Moderate protein-calorie malnutrition: Secondary | ICD-10-CM | POA: Diagnosis not present

## 2022-06-13 DIAGNOSIS — Z7901 Long term (current) use of anticoagulants: Secondary | ICD-10-CM | POA: Diagnosis not present

## 2022-06-13 DIAGNOSIS — I87323 Chronic venous hypertension (idiopathic) with inflammation of bilateral lower extremity: Secondary | ICD-10-CM | POA: Diagnosis not present

## 2022-06-13 DIAGNOSIS — I2510 Atherosclerotic heart disease of native coronary artery without angina pectoris: Secondary | ICD-10-CM | POA: Diagnosis not present

## 2022-06-13 DIAGNOSIS — F4321 Adjustment disorder with depressed mood: Secondary | ICD-10-CM | POA: Diagnosis not present

## 2022-06-13 DIAGNOSIS — I890 Lymphedema, not elsewhere classified: Secondary | ICD-10-CM | POA: Diagnosis not present

## 2022-06-13 DIAGNOSIS — N179 Acute kidney failure, unspecified: Secondary | ICD-10-CM | POA: Diagnosis not present

## 2022-06-13 DIAGNOSIS — Z87891 Personal history of nicotine dependence: Secondary | ICD-10-CM | POA: Diagnosis not present

## 2022-06-13 DIAGNOSIS — Z9181 History of falling: Secondary | ICD-10-CM | POA: Diagnosis not present

## 2022-06-14 DIAGNOSIS — I482 Chronic atrial fibrillation, unspecified: Secondary | ICD-10-CM | POA: Diagnosis not present

## 2022-06-14 DIAGNOSIS — R600 Localized edema: Secondary | ICD-10-CM | POA: Diagnosis not present

## 2022-06-14 DIAGNOSIS — E441 Mild protein-calorie malnutrition: Secondary | ICD-10-CM | POA: Diagnosis not present

## 2022-06-14 DIAGNOSIS — C4490 Unspecified malignant neoplasm of skin, unspecified: Secondary | ICD-10-CM | POA: Diagnosis not present

## 2022-06-15 DIAGNOSIS — I87323 Chronic venous hypertension (idiopathic) with inflammation of bilateral lower extremity: Secondary | ICD-10-CM | POA: Diagnosis not present

## 2022-06-15 DIAGNOSIS — E440 Moderate protein-calorie malnutrition: Secondary | ICD-10-CM | POA: Diagnosis not present

## 2022-06-15 DIAGNOSIS — I4819 Other persistent atrial fibrillation: Secondary | ICD-10-CM | POA: Diagnosis not present

## 2022-06-15 DIAGNOSIS — Z7901 Long term (current) use of anticoagulants: Secondary | ICD-10-CM | POA: Diagnosis not present

## 2022-06-15 DIAGNOSIS — F4321 Adjustment disorder with depressed mood: Secondary | ICD-10-CM | POA: Diagnosis not present

## 2022-06-15 DIAGNOSIS — I2510 Atherosclerotic heart disease of native coronary artery without angina pectoris: Secondary | ICD-10-CM | POA: Diagnosis not present

## 2022-06-15 DIAGNOSIS — F0390 Unspecified dementia without behavioral disturbance: Secondary | ICD-10-CM | POA: Diagnosis not present

## 2022-06-15 DIAGNOSIS — N179 Acute kidney failure, unspecified: Secondary | ICD-10-CM | POA: Diagnosis not present

## 2022-06-15 DIAGNOSIS — Z9181 History of falling: Secondary | ICD-10-CM | POA: Diagnosis not present

## 2022-06-15 DIAGNOSIS — I890 Lymphedema, not elsewhere classified: Secondary | ICD-10-CM | POA: Diagnosis not present

## 2022-06-15 DIAGNOSIS — E7849 Other hyperlipidemia: Secondary | ICD-10-CM | POA: Diagnosis not present

## 2022-06-15 DIAGNOSIS — Z87891 Personal history of nicotine dependence: Secondary | ICD-10-CM | POA: Diagnosis not present

## 2022-06-19 DIAGNOSIS — F4321 Adjustment disorder with depressed mood: Secondary | ICD-10-CM | POA: Diagnosis not present

## 2022-06-19 DIAGNOSIS — I87323 Chronic venous hypertension (idiopathic) with inflammation of bilateral lower extremity: Secondary | ICD-10-CM | POA: Diagnosis not present

## 2022-06-19 DIAGNOSIS — I2510 Atherosclerotic heart disease of native coronary artery without angina pectoris: Secondary | ICD-10-CM | POA: Diagnosis not present

## 2022-06-19 DIAGNOSIS — Z87891 Personal history of nicotine dependence: Secondary | ICD-10-CM | POA: Diagnosis not present

## 2022-06-19 DIAGNOSIS — Z7901 Long term (current) use of anticoagulants: Secondary | ICD-10-CM | POA: Diagnosis not present

## 2022-06-19 DIAGNOSIS — F0390 Unspecified dementia without behavioral disturbance: Secondary | ICD-10-CM | POA: Diagnosis not present

## 2022-06-19 DIAGNOSIS — E440 Moderate protein-calorie malnutrition: Secondary | ICD-10-CM | POA: Diagnosis not present

## 2022-06-19 DIAGNOSIS — N179 Acute kidney failure, unspecified: Secondary | ICD-10-CM | POA: Diagnosis not present

## 2022-06-19 DIAGNOSIS — I890 Lymphedema, not elsewhere classified: Secondary | ICD-10-CM | POA: Diagnosis not present

## 2022-06-19 DIAGNOSIS — Z9181 History of falling: Secondary | ICD-10-CM | POA: Diagnosis not present

## 2022-06-19 DIAGNOSIS — E7849 Other hyperlipidemia: Secondary | ICD-10-CM | POA: Diagnosis not present

## 2022-06-19 DIAGNOSIS — I4819 Other persistent atrial fibrillation: Secondary | ICD-10-CM | POA: Diagnosis not present

## 2022-06-20 DIAGNOSIS — L308 Other specified dermatitis: Secondary | ICD-10-CM | POA: Diagnosis not present

## 2022-06-20 DIAGNOSIS — L821 Other seborrheic keratosis: Secondary | ICD-10-CM | POA: Diagnosis not present

## 2022-06-20 DIAGNOSIS — F419 Anxiety disorder, unspecified: Secondary | ICD-10-CM | POA: Diagnosis not present

## 2022-06-20 DIAGNOSIS — L579 Skin changes due to chronic exposure to nonionizing radiation, unspecified: Secondary | ICD-10-CM | POA: Diagnosis not present

## 2022-06-20 DIAGNOSIS — G309 Alzheimer's disease, unspecified: Secondary | ICD-10-CM | POA: Diagnosis not present

## 2022-06-20 DIAGNOSIS — I872 Venous insufficiency (chronic) (peripheral): Secondary | ICD-10-CM | POA: Diagnosis not present

## 2022-06-20 DIAGNOSIS — F0280 Dementia in other diseases classified elsewhere without behavioral disturbance: Secondary | ICD-10-CM | POA: Diagnosis not present

## 2022-06-20 DIAGNOSIS — F330 Major depressive disorder, recurrent, mild: Secondary | ICD-10-CM | POA: Diagnosis not present

## 2022-06-20 DIAGNOSIS — R600 Localized edema: Secondary | ICD-10-CM | POA: Diagnosis not present

## 2022-06-22 DIAGNOSIS — I890 Lymphedema, not elsewhere classified: Secondary | ICD-10-CM | POA: Diagnosis not present

## 2022-06-22 DIAGNOSIS — F4321 Adjustment disorder with depressed mood: Secondary | ICD-10-CM | POA: Diagnosis not present

## 2022-06-22 DIAGNOSIS — E7849 Other hyperlipidemia: Secondary | ICD-10-CM | POA: Diagnosis not present

## 2022-06-22 DIAGNOSIS — I87323 Chronic venous hypertension (idiopathic) with inflammation of bilateral lower extremity: Secondary | ICD-10-CM | POA: Diagnosis not present

## 2022-06-22 DIAGNOSIS — F0390 Unspecified dementia without behavioral disturbance: Secondary | ICD-10-CM | POA: Diagnosis not present

## 2022-06-22 DIAGNOSIS — I4819 Other persistent atrial fibrillation: Secondary | ICD-10-CM | POA: Diagnosis not present

## 2022-06-22 DIAGNOSIS — N179 Acute kidney failure, unspecified: Secondary | ICD-10-CM | POA: Diagnosis not present

## 2022-06-22 DIAGNOSIS — Z9181 History of falling: Secondary | ICD-10-CM | POA: Diagnosis not present

## 2022-06-22 DIAGNOSIS — I2510 Atherosclerotic heart disease of native coronary artery without angina pectoris: Secondary | ICD-10-CM | POA: Diagnosis not present

## 2022-06-22 DIAGNOSIS — Z7901 Long term (current) use of anticoagulants: Secondary | ICD-10-CM | POA: Diagnosis not present

## 2022-06-22 DIAGNOSIS — Z87891 Personal history of nicotine dependence: Secondary | ICD-10-CM | POA: Diagnosis not present

## 2022-06-22 DIAGNOSIS — E440 Moderate protein-calorie malnutrition: Secondary | ICD-10-CM | POA: Diagnosis not present

## 2022-06-23 DIAGNOSIS — I87323 Chronic venous hypertension (idiopathic) with inflammation of bilateral lower extremity: Secondary | ICD-10-CM | POA: Diagnosis not present

## 2022-06-23 DIAGNOSIS — I890 Lymphedema, not elsewhere classified: Secondary | ICD-10-CM | POA: Diagnosis not present

## 2022-06-23 DIAGNOSIS — I2510 Atherosclerotic heart disease of native coronary artery without angina pectoris: Secondary | ICD-10-CM | POA: Diagnosis not present

## 2022-06-26 DIAGNOSIS — I87323 Chronic venous hypertension (idiopathic) with inflammation of bilateral lower extremity: Secondary | ICD-10-CM | POA: Diagnosis not present

## 2022-06-26 DIAGNOSIS — Z87891 Personal history of nicotine dependence: Secondary | ICD-10-CM | POA: Diagnosis not present

## 2022-06-26 DIAGNOSIS — I890 Lymphedema, not elsewhere classified: Secondary | ICD-10-CM | POA: Diagnosis not present

## 2022-06-26 DIAGNOSIS — Z9181 History of falling: Secondary | ICD-10-CM | POA: Diagnosis not present

## 2022-06-26 DIAGNOSIS — E440 Moderate protein-calorie malnutrition: Secondary | ICD-10-CM | POA: Diagnosis not present

## 2022-06-26 DIAGNOSIS — E7849 Other hyperlipidemia: Secondary | ICD-10-CM | POA: Diagnosis not present

## 2022-06-26 DIAGNOSIS — I2510 Atherosclerotic heart disease of native coronary artery without angina pectoris: Secondary | ICD-10-CM | POA: Diagnosis not present

## 2022-06-26 DIAGNOSIS — F0390 Unspecified dementia without behavioral disturbance: Secondary | ICD-10-CM | POA: Diagnosis not present

## 2022-06-26 DIAGNOSIS — N179 Acute kidney failure, unspecified: Secondary | ICD-10-CM | POA: Diagnosis not present

## 2022-06-26 DIAGNOSIS — Z7901 Long term (current) use of anticoagulants: Secondary | ICD-10-CM | POA: Diagnosis not present

## 2022-06-26 DIAGNOSIS — F4321 Adjustment disorder with depressed mood: Secondary | ICD-10-CM | POA: Diagnosis not present

## 2022-06-26 DIAGNOSIS — I4819 Other persistent atrial fibrillation: Secondary | ICD-10-CM | POA: Diagnosis not present

## 2022-06-28 DIAGNOSIS — I482 Chronic atrial fibrillation, unspecified: Secondary | ICD-10-CM | POA: Diagnosis not present

## 2022-06-28 DIAGNOSIS — F419 Anxiety disorder, unspecified: Secondary | ICD-10-CM | POA: Diagnosis not present

## 2022-06-29 DIAGNOSIS — F0390 Unspecified dementia without behavioral disturbance: Secondary | ICD-10-CM | POA: Diagnosis not present

## 2022-06-29 DIAGNOSIS — I2510 Atherosclerotic heart disease of native coronary artery without angina pectoris: Secondary | ICD-10-CM | POA: Diagnosis not present

## 2022-06-29 DIAGNOSIS — Z87891 Personal history of nicotine dependence: Secondary | ICD-10-CM | POA: Diagnosis not present

## 2022-06-29 DIAGNOSIS — Z7901 Long term (current) use of anticoagulants: Secondary | ICD-10-CM | POA: Diagnosis not present

## 2022-06-29 DIAGNOSIS — E440 Moderate protein-calorie malnutrition: Secondary | ICD-10-CM | POA: Diagnosis not present

## 2022-06-29 DIAGNOSIS — I87323 Chronic venous hypertension (idiopathic) with inflammation of bilateral lower extremity: Secondary | ICD-10-CM | POA: Diagnosis not present

## 2022-06-29 DIAGNOSIS — E7849 Other hyperlipidemia: Secondary | ICD-10-CM | POA: Diagnosis not present

## 2022-06-29 DIAGNOSIS — I890 Lymphedema, not elsewhere classified: Secondary | ICD-10-CM | POA: Diagnosis not present

## 2022-06-29 DIAGNOSIS — I4819 Other persistent atrial fibrillation: Secondary | ICD-10-CM | POA: Diagnosis not present

## 2022-06-29 DIAGNOSIS — F4321 Adjustment disorder with depressed mood: Secondary | ICD-10-CM | POA: Diagnosis not present

## 2022-06-29 DIAGNOSIS — N179 Acute kidney failure, unspecified: Secondary | ICD-10-CM | POA: Diagnosis not present

## 2022-06-29 DIAGNOSIS — Z9181 History of falling: Secondary | ICD-10-CM | POA: Diagnosis not present

## 2022-07-03 DIAGNOSIS — E440 Moderate protein-calorie malnutrition: Secondary | ICD-10-CM | POA: Diagnosis not present

## 2022-07-03 DIAGNOSIS — Z9181 History of falling: Secondary | ICD-10-CM | POA: Diagnosis not present

## 2022-07-03 DIAGNOSIS — I890 Lymphedema, not elsewhere classified: Secondary | ICD-10-CM | POA: Diagnosis not present

## 2022-07-03 DIAGNOSIS — F4321 Adjustment disorder with depressed mood: Secondary | ICD-10-CM | POA: Diagnosis not present

## 2022-07-03 DIAGNOSIS — F0390 Unspecified dementia without behavioral disturbance: Secondary | ICD-10-CM | POA: Diagnosis not present

## 2022-07-03 DIAGNOSIS — E782 Mixed hyperlipidemia: Secondary | ICD-10-CM | POA: Diagnosis not present

## 2022-07-03 DIAGNOSIS — I87323 Chronic venous hypertension (idiopathic) with inflammation of bilateral lower extremity: Secondary | ICD-10-CM | POA: Diagnosis not present

## 2022-07-03 DIAGNOSIS — E7849 Other hyperlipidemia: Secondary | ICD-10-CM | POA: Diagnosis not present

## 2022-07-03 DIAGNOSIS — Z87891 Personal history of nicotine dependence: Secondary | ICD-10-CM | POA: Diagnosis not present

## 2022-07-03 DIAGNOSIS — N179 Acute kidney failure, unspecified: Secondary | ICD-10-CM | POA: Diagnosis not present

## 2022-07-03 DIAGNOSIS — I482 Chronic atrial fibrillation, unspecified: Secondary | ICD-10-CM | POA: Diagnosis not present

## 2022-07-03 DIAGNOSIS — I872 Venous insufficiency (chronic) (peripheral): Secondary | ICD-10-CM | POA: Diagnosis not present

## 2022-07-03 DIAGNOSIS — I2510 Atherosclerotic heart disease of native coronary artery without angina pectoris: Secondary | ICD-10-CM | POA: Diagnosis not present

## 2022-07-03 DIAGNOSIS — Z7901 Long term (current) use of anticoagulants: Secondary | ICD-10-CM | POA: Diagnosis not present

## 2022-07-03 DIAGNOSIS — I4819 Other persistent atrial fibrillation: Secondary | ICD-10-CM | POA: Diagnosis not present

## 2022-07-08 DIAGNOSIS — F0390 Unspecified dementia without behavioral disturbance: Secondary | ICD-10-CM | POA: Diagnosis not present

## 2022-07-08 DIAGNOSIS — I890 Lymphedema, not elsewhere classified: Secondary | ICD-10-CM | POA: Diagnosis not present

## 2022-07-08 DIAGNOSIS — N179 Acute kidney failure, unspecified: Secondary | ICD-10-CM | POA: Diagnosis not present

## 2022-07-08 DIAGNOSIS — E440 Moderate protein-calorie malnutrition: Secondary | ICD-10-CM | POA: Diagnosis not present

## 2022-07-08 DIAGNOSIS — I87323 Chronic venous hypertension (idiopathic) with inflammation of bilateral lower extremity: Secondary | ICD-10-CM | POA: Diagnosis not present

## 2022-07-08 DIAGNOSIS — E7849 Other hyperlipidemia: Secondary | ICD-10-CM | POA: Diagnosis not present

## 2022-07-08 DIAGNOSIS — Z7901 Long term (current) use of anticoagulants: Secondary | ICD-10-CM | POA: Diagnosis not present

## 2022-07-08 DIAGNOSIS — I2510 Atherosclerotic heart disease of native coronary artery without angina pectoris: Secondary | ICD-10-CM | POA: Diagnosis not present

## 2022-07-08 DIAGNOSIS — F4321 Adjustment disorder with depressed mood: Secondary | ICD-10-CM | POA: Diagnosis not present

## 2022-07-08 DIAGNOSIS — Z9181 History of falling: Secondary | ICD-10-CM | POA: Diagnosis not present

## 2022-07-08 DIAGNOSIS — I4819 Other persistent atrial fibrillation: Secondary | ICD-10-CM | POA: Diagnosis not present

## 2022-07-08 DIAGNOSIS — Z87891 Personal history of nicotine dependence: Secondary | ICD-10-CM | POA: Diagnosis not present

## 2022-07-12 DIAGNOSIS — E7849 Other hyperlipidemia: Secondary | ICD-10-CM | POA: Diagnosis not present

## 2022-07-12 DIAGNOSIS — I4819 Other persistent atrial fibrillation: Secondary | ICD-10-CM | POA: Diagnosis not present

## 2022-07-12 DIAGNOSIS — Z87891 Personal history of nicotine dependence: Secondary | ICD-10-CM | POA: Diagnosis not present

## 2022-07-12 DIAGNOSIS — Z9181 History of falling: Secondary | ICD-10-CM | POA: Diagnosis not present

## 2022-07-12 DIAGNOSIS — E440 Moderate protein-calorie malnutrition: Secondary | ICD-10-CM | POA: Diagnosis not present

## 2022-07-12 DIAGNOSIS — F0390 Unspecified dementia without behavioral disturbance: Secondary | ICD-10-CM | POA: Diagnosis not present

## 2022-07-12 DIAGNOSIS — F4321 Adjustment disorder with depressed mood: Secondary | ICD-10-CM | POA: Diagnosis not present

## 2022-07-12 DIAGNOSIS — I87323 Chronic venous hypertension (idiopathic) with inflammation of bilateral lower extremity: Secondary | ICD-10-CM | POA: Diagnosis not present

## 2022-07-12 DIAGNOSIS — I890 Lymphedema, not elsewhere classified: Secondary | ICD-10-CM | POA: Diagnosis not present

## 2022-07-12 DIAGNOSIS — N179 Acute kidney failure, unspecified: Secondary | ICD-10-CM | POA: Diagnosis not present

## 2022-07-12 DIAGNOSIS — Z7901 Long term (current) use of anticoagulants: Secondary | ICD-10-CM | POA: Diagnosis not present

## 2022-07-12 DIAGNOSIS — I2510 Atherosclerotic heart disease of native coronary artery without angina pectoris: Secondary | ICD-10-CM | POA: Diagnosis not present

## 2022-07-14 DIAGNOSIS — I739 Peripheral vascular disease, unspecified: Secondary | ICD-10-CM | POA: Diagnosis not present

## 2022-07-14 DIAGNOSIS — M79675 Pain in left toe(s): Secondary | ICD-10-CM | POA: Diagnosis not present

## 2022-07-14 DIAGNOSIS — B351 Tinea unguium: Secondary | ICD-10-CM | POA: Diagnosis not present

## 2022-07-18 DIAGNOSIS — F330 Major depressive disorder, recurrent, mild: Secondary | ICD-10-CM | POA: Diagnosis not present

## 2022-07-18 DIAGNOSIS — F419 Anxiety disorder, unspecified: Secondary | ICD-10-CM | POA: Diagnosis not present

## 2022-07-18 DIAGNOSIS — F0280 Dementia in other diseases classified elsewhere without behavioral disturbance: Secondary | ICD-10-CM | POA: Diagnosis not present

## 2022-07-18 DIAGNOSIS — G309 Alzheimer's disease, unspecified: Secondary | ICD-10-CM | POA: Diagnosis not present

## 2022-07-19 DIAGNOSIS — F4321 Adjustment disorder with depressed mood: Secondary | ICD-10-CM | POA: Diagnosis not present

## 2022-07-19 DIAGNOSIS — I87323 Chronic venous hypertension (idiopathic) with inflammation of bilateral lower extremity: Secondary | ICD-10-CM | POA: Diagnosis not present

## 2022-07-19 DIAGNOSIS — E440 Moderate protein-calorie malnutrition: Secondary | ICD-10-CM | POA: Diagnosis not present

## 2022-07-19 DIAGNOSIS — I4819 Other persistent atrial fibrillation: Secondary | ICD-10-CM | POA: Diagnosis not present

## 2022-07-19 DIAGNOSIS — I2510 Atherosclerotic heart disease of native coronary artery without angina pectoris: Secondary | ICD-10-CM | POA: Diagnosis not present

## 2022-07-19 DIAGNOSIS — F0390 Unspecified dementia without behavioral disturbance: Secondary | ICD-10-CM | POA: Diagnosis not present

## 2022-07-19 DIAGNOSIS — I890 Lymphedema, not elsewhere classified: Secondary | ICD-10-CM | POA: Diagnosis not present

## 2022-07-19 DIAGNOSIS — Z87891 Personal history of nicotine dependence: Secondary | ICD-10-CM | POA: Diagnosis not present

## 2022-07-19 DIAGNOSIS — Z9181 History of falling: Secondary | ICD-10-CM | POA: Diagnosis not present

## 2022-07-19 DIAGNOSIS — E7849 Other hyperlipidemia: Secondary | ICD-10-CM | POA: Diagnosis not present

## 2022-07-19 DIAGNOSIS — Z7901 Long term (current) use of anticoagulants: Secondary | ICD-10-CM | POA: Diagnosis not present

## 2022-07-19 DIAGNOSIS — N179 Acute kidney failure, unspecified: Secondary | ICD-10-CM | POA: Diagnosis not present

## 2022-07-26 DIAGNOSIS — F0390 Unspecified dementia without behavioral disturbance: Secondary | ICD-10-CM | POA: Diagnosis not present

## 2022-07-26 DIAGNOSIS — E440 Moderate protein-calorie malnutrition: Secondary | ICD-10-CM | POA: Diagnosis not present

## 2022-07-26 DIAGNOSIS — F4321 Adjustment disorder with depressed mood: Secondary | ICD-10-CM | POA: Diagnosis not present

## 2022-07-26 DIAGNOSIS — I2510 Atherosclerotic heart disease of native coronary artery without angina pectoris: Secondary | ICD-10-CM | POA: Diagnosis not present

## 2022-07-26 DIAGNOSIS — I4819 Other persistent atrial fibrillation: Secondary | ICD-10-CM | POA: Diagnosis not present

## 2022-07-26 DIAGNOSIS — Z7901 Long term (current) use of anticoagulants: Secondary | ICD-10-CM | POA: Diagnosis not present

## 2022-07-26 DIAGNOSIS — E7849 Other hyperlipidemia: Secondary | ICD-10-CM | POA: Diagnosis not present

## 2022-07-26 DIAGNOSIS — Z9181 History of falling: Secondary | ICD-10-CM | POA: Diagnosis not present

## 2022-07-26 DIAGNOSIS — Z87891 Personal history of nicotine dependence: Secondary | ICD-10-CM | POA: Diagnosis not present

## 2022-07-26 DIAGNOSIS — N179 Acute kidney failure, unspecified: Secondary | ICD-10-CM | POA: Diagnosis not present

## 2022-07-26 DIAGNOSIS — I890 Lymphedema, not elsewhere classified: Secondary | ICD-10-CM | POA: Diagnosis not present

## 2022-07-26 DIAGNOSIS — I87323 Chronic venous hypertension (idiopathic) with inflammation of bilateral lower extremity: Secondary | ICD-10-CM | POA: Diagnosis not present

## 2022-07-28 DIAGNOSIS — I482 Chronic atrial fibrillation, unspecified: Secondary | ICD-10-CM | POA: Diagnosis not present

## 2022-07-28 DIAGNOSIS — F339 Major depressive disorder, recurrent, unspecified: Secondary | ICD-10-CM | POA: Diagnosis not present

## 2022-07-31 DIAGNOSIS — I87323 Chronic venous hypertension (idiopathic) with inflammation of bilateral lower extremity: Secondary | ICD-10-CM | POA: Diagnosis not present

## 2022-07-31 DIAGNOSIS — I482 Chronic atrial fibrillation, unspecified: Secondary | ICD-10-CM | POA: Diagnosis not present

## 2022-07-31 DIAGNOSIS — R600 Localized edema: Secondary | ICD-10-CM | POA: Diagnosis not present

## 2022-07-31 DIAGNOSIS — E782 Mixed hyperlipidemia: Secondary | ICD-10-CM | POA: Diagnosis not present

## 2022-08-01 DIAGNOSIS — N179 Acute kidney failure, unspecified: Secondary | ICD-10-CM | POA: Diagnosis not present

## 2022-08-01 DIAGNOSIS — Z7901 Long term (current) use of anticoagulants: Secondary | ICD-10-CM | POA: Diagnosis not present

## 2022-08-01 DIAGNOSIS — F4321 Adjustment disorder with depressed mood: Secondary | ICD-10-CM | POA: Diagnosis not present

## 2022-08-01 DIAGNOSIS — Z9181 History of falling: Secondary | ICD-10-CM | POA: Diagnosis not present

## 2022-08-01 DIAGNOSIS — E7849 Other hyperlipidemia: Secondary | ICD-10-CM | POA: Diagnosis not present

## 2022-08-01 DIAGNOSIS — I87323 Chronic venous hypertension (idiopathic) with inflammation of bilateral lower extremity: Secondary | ICD-10-CM | POA: Diagnosis not present

## 2022-08-01 DIAGNOSIS — E440 Moderate protein-calorie malnutrition: Secondary | ICD-10-CM | POA: Diagnosis not present

## 2022-08-01 DIAGNOSIS — I890 Lymphedema, not elsewhere classified: Secondary | ICD-10-CM | POA: Diagnosis not present

## 2022-08-01 DIAGNOSIS — I4819 Other persistent atrial fibrillation: Secondary | ICD-10-CM | POA: Diagnosis not present

## 2022-08-01 DIAGNOSIS — F0390 Unspecified dementia without behavioral disturbance: Secondary | ICD-10-CM | POA: Diagnosis not present

## 2022-08-01 DIAGNOSIS — Z87891 Personal history of nicotine dependence: Secondary | ICD-10-CM | POA: Diagnosis not present

## 2022-08-01 DIAGNOSIS — I2510 Atherosclerotic heart disease of native coronary artery without angina pectoris: Secondary | ICD-10-CM | POA: Diagnosis not present

## 2022-08-14 ENCOUNTER — Encounter: Payer: Self-pay | Admitting: *Deleted

## 2022-08-15 DIAGNOSIS — F419 Anxiety disorder, unspecified: Secondary | ICD-10-CM | POA: Diagnosis not present

## 2022-08-15 DIAGNOSIS — F0280 Dementia in other diseases classified elsewhere without behavioral disturbance: Secondary | ICD-10-CM | POA: Diagnosis not present

## 2022-08-15 DIAGNOSIS — G309 Alzheimer's disease, unspecified: Secondary | ICD-10-CM | POA: Diagnosis not present

## 2022-08-15 DIAGNOSIS — F339 Major depressive disorder, recurrent, unspecified: Secondary | ICD-10-CM | POA: Diagnosis not present

## 2022-08-21 DIAGNOSIS — L308 Other specified dermatitis: Secondary | ICD-10-CM | POA: Diagnosis not present

## 2022-08-21 DIAGNOSIS — R600 Localized edema: Secondary | ICD-10-CM | POA: Diagnosis not present

## 2022-08-21 DIAGNOSIS — I872 Venous insufficiency (chronic) (peripheral): Secondary | ICD-10-CM | POA: Diagnosis not present

## 2022-08-21 DIAGNOSIS — I87323 Chronic venous hypertension (idiopathic) with inflammation of bilateral lower extremity: Secondary | ICD-10-CM | POA: Diagnosis not present

## 2022-08-24 DIAGNOSIS — Z955 Presence of coronary angioplasty implant and graft: Secondary | ICD-10-CM | POA: Diagnosis not present

## 2022-08-24 DIAGNOSIS — G309 Alzheimer's disease, unspecified: Secondary | ICD-10-CM | POA: Diagnosis not present

## 2022-08-24 DIAGNOSIS — E782 Mixed hyperlipidemia: Secondary | ICD-10-CM | POA: Diagnosis not present

## 2022-08-24 DIAGNOSIS — I10 Essential (primary) hypertension: Secondary | ICD-10-CM | POA: Diagnosis not present

## 2022-08-24 DIAGNOSIS — I87323 Chronic venous hypertension (idiopathic) with inflammation of bilateral lower extremity: Secondary | ICD-10-CM | POA: Diagnosis not present

## 2022-08-24 DIAGNOSIS — I890 Lymphedema, not elsewhere classified: Secondary | ICD-10-CM | POA: Diagnosis not present

## 2022-08-24 DIAGNOSIS — F02B4 Dementia in other diseases classified elsewhere, moderate, with anxiety: Secondary | ICD-10-CM | POA: Diagnosis not present

## 2022-08-24 DIAGNOSIS — Z7901 Long term (current) use of anticoagulants: Secondary | ICD-10-CM | POA: Diagnosis not present

## 2022-08-24 DIAGNOSIS — E440 Moderate protein-calorie malnutrition: Secondary | ICD-10-CM | POA: Diagnosis not present

## 2022-08-24 DIAGNOSIS — F329 Major depressive disorder, single episode, unspecified: Secondary | ICD-10-CM | POA: Diagnosis not present

## 2022-08-24 DIAGNOSIS — N179 Acute kidney failure, unspecified: Secondary | ICD-10-CM | POA: Diagnosis not present

## 2022-08-24 DIAGNOSIS — I4819 Other persistent atrial fibrillation: Secondary | ICD-10-CM | POA: Diagnosis not present

## 2022-08-24 DIAGNOSIS — F4321 Adjustment disorder with depressed mood: Secondary | ICD-10-CM | POA: Diagnosis not present

## 2022-08-24 DIAGNOSIS — F02B3 Dementia in other diseases classified elsewhere, moderate, with mood disturbance: Secondary | ICD-10-CM | POA: Diagnosis not present

## 2022-08-24 DIAGNOSIS — I2510 Atherosclerotic heart disease of native coronary artery without angina pectoris: Secondary | ICD-10-CM | POA: Diagnosis not present

## 2022-08-24 DIAGNOSIS — Z87891 Personal history of nicotine dependence: Secondary | ICD-10-CM | POA: Diagnosis not present

## 2022-08-25 DIAGNOSIS — I10 Essential (primary) hypertension: Secondary | ICD-10-CM | POA: Diagnosis not present

## 2022-08-25 DIAGNOSIS — F411 Generalized anxiety disorder: Secondary | ICD-10-CM | POA: Diagnosis not present

## 2022-08-28 DIAGNOSIS — I872 Venous insufficiency (chronic) (peripheral): Secondary | ICD-10-CM | POA: Diagnosis not present

## 2022-08-28 DIAGNOSIS — I482 Chronic atrial fibrillation, unspecified: Secondary | ICD-10-CM | POA: Diagnosis not present

## 2022-08-28 DIAGNOSIS — E782 Mixed hyperlipidemia: Secondary | ICD-10-CM | POA: Diagnosis not present

## 2022-08-28 DIAGNOSIS — R600 Localized edema: Secondary | ICD-10-CM | POA: Diagnosis not present

## 2022-08-30 DIAGNOSIS — I10 Essential (primary) hypertension: Secondary | ICD-10-CM | POA: Diagnosis not present

## 2022-08-30 DIAGNOSIS — N179 Acute kidney failure, unspecified: Secondary | ICD-10-CM | POA: Diagnosis not present

## 2022-08-30 DIAGNOSIS — Z955 Presence of coronary angioplasty implant and graft: Secondary | ICD-10-CM | POA: Diagnosis not present

## 2022-08-30 DIAGNOSIS — E782 Mixed hyperlipidemia: Secondary | ICD-10-CM | POA: Diagnosis not present

## 2022-08-30 DIAGNOSIS — I2510 Atherosclerotic heart disease of native coronary artery without angina pectoris: Secondary | ICD-10-CM | POA: Diagnosis not present

## 2022-08-30 DIAGNOSIS — G309 Alzheimer's disease, unspecified: Secondary | ICD-10-CM | POA: Diagnosis not present

## 2022-08-30 DIAGNOSIS — F02B4 Dementia in other diseases classified elsewhere, moderate, with anxiety: Secondary | ICD-10-CM | POA: Diagnosis not present

## 2022-08-30 DIAGNOSIS — Z87891 Personal history of nicotine dependence: Secondary | ICD-10-CM | POA: Diagnosis not present

## 2022-08-30 DIAGNOSIS — I4819 Other persistent atrial fibrillation: Secondary | ICD-10-CM | POA: Diagnosis not present

## 2022-08-30 DIAGNOSIS — I890 Lymphedema, not elsewhere classified: Secondary | ICD-10-CM | POA: Diagnosis not present

## 2022-08-30 DIAGNOSIS — E440 Moderate protein-calorie malnutrition: Secondary | ICD-10-CM | POA: Diagnosis not present

## 2022-08-30 DIAGNOSIS — F329 Major depressive disorder, single episode, unspecified: Secondary | ICD-10-CM | POA: Diagnosis not present

## 2022-08-30 DIAGNOSIS — F4321 Adjustment disorder with depressed mood: Secondary | ICD-10-CM | POA: Diagnosis not present

## 2022-08-30 DIAGNOSIS — F02B3 Dementia in other diseases classified elsewhere, moderate, with mood disturbance: Secondary | ICD-10-CM | POA: Diagnosis not present

## 2022-08-30 DIAGNOSIS — Z7901 Long term (current) use of anticoagulants: Secondary | ICD-10-CM | POA: Diagnosis not present

## 2022-08-30 DIAGNOSIS — I87323 Chronic venous hypertension (idiopathic) with inflammation of bilateral lower extremity: Secondary | ICD-10-CM | POA: Diagnosis not present

## 2022-08-31 DIAGNOSIS — F02B4 Dementia in other diseases classified elsewhere, moderate, with anxiety: Secondary | ICD-10-CM | POA: Diagnosis not present

## 2022-08-31 DIAGNOSIS — R600 Localized edema: Secondary | ICD-10-CM | POA: Diagnosis not present

## 2022-08-31 DIAGNOSIS — F4321 Adjustment disorder with depressed mood: Secondary | ICD-10-CM | POA: Diagnosis not present

## 2022-08-31 DIAGNOSIS — F02B3 Dementia in other diseases classified elsewhere, moderate, with mood disturbance: Secondary | ICD-10-CM | POA: Diagnosis not present

## 2022-08-31 DIAGNOSIS — E782 Mixed hyperlipidemia: Secondary | ICD-10-CM | POA: Diagnosis not present

## 2022-08-31 DIAGNOSIS — I87323 Chronic venous hypertension (idiopathic) with inflammation of bilateral lower extremity: Secondary | ICD-10-CM | POA: Diagnosis not present

## 2022-08-31 DIAGNOSIS — I890 Lymphedema, not elsewhere classified: Secondary | ICD-10-CM | POA: Diagnosis not present

## 2022-08-31 DIAGNOSIS — I2510 Atherosclerotic heart disease of native coronary artery without angina pectoris: Secondary | ICD-10-CM | POA: Diagnosis not present

## 2022-08-31 DIAGNOSIS — Z955 Presence of coronary angioplasty implant and graft: Secondary | ICD-10-CM | POA: Diagnosis not present

## 2022-08-31 DIAGNOSIS — F329 Major depressive disorder, single episode, unspecified: Secondary | ICD-10-CM | POA: Diagnosis not present

## 2022-08-31 DIAGNOSIS — N179 Acute kidney failure, unspecified: Secondary | ICD-10-CM | POA: Diagnosis not present

## 2022-08-31 DIAGNOSIS — I4819 Other persistent atrial fibrillation: Secondary | ICD-10-CM | POA: Diagnosis not present

## 2022-08-31 DIAGNOSIS — G309 Alzheimer's disease, unspecified: Secondary | ICD-10-CM | POA: Diagnosis not present

## 2022-08-31 DIAGNOSIS — E440 Moderate protein-calorie malnutrition: Secondary | ICD-10-CM | POA: Diagnosis not present

## 2022-08-31 DIAGNOSIS — M6281 Muscle weakness (generalized): Secondary | ICD-10-CM | POA: Diagnosis not present

## 2022-08-31 DIAGNOSIS — Z7901 Long term (current) use of anticoagulants: Secondary | ICD-10-CM | POA: Diagnosis not present

## 2022-08-31 DIAGNOSIS — Z87891 Personal history of nicotine dependence: Secondary | ICD-10-CM | POA: Diagnosis not present

## 2022-08-31 DIAGNOSIS — I10 Essential (primary) hypertension: Secondary | ICD-10-CM | POA: Diagnosis not present

## 2022-09-07 DIAGNOSIS — E440 Moderate protein-calorie malnutrition: Secondary | ICD-10-CM | POA: Diagnosis not present

## 2022-09-07 DIAGNOSIS — N179 Acute kidney failure, unspecified: Secondary | ICD-10-CM | POA: Diagnosis not present

## 2022-09-07 DIAGNOSIS — F329 Major depressive disorder, single episode, unspecified: Secondary | ICD-10-CM | POA: Diagnosis not present

## 2022-09-07 DIAGNOSIS — I890 Lymphedema, not elsewhere classified: Secondary | ICD-10-CM | POA: Diagnosis not present

## 2022-09-07 DIAGNOSIS — I4819 Other persistent atrial fibrillation: Secondary | ICD-10-CM | POA: Diagnosis not present

## 2022-09-07 DIAGNOSIS — F4321 Adjustment disorder with depressed mood: Secondary | ICD-10-CM | POA: Diagnosis not present

## 2022-09-07 DIAGNOSIS — I2510 Atherosclerotic heart disease of native coronary artery without angina pectoris: Secondary | ICD-10-CM | POA: Diagnosis not present

## 2022-09-07 DIAGNOSIS — G309 Alzheimer's disease, unspecified: Secondary | ICD-10-CM | POA: Diagnosis not present

## 2022-09-07 DIAGNOSIS — E782 Mixed hyperlipidemia: Secondary | ICD-10-CM | POA: Diagnosis not present

## 2022-09-07 DIAGNOSIS — Z87891 Personal history of nicotine dependence: Secondary | ICD-10-CM | POA: Diagnosis not present

## 2022-09-07 DIAGNOSIS — F02B4 Dementia in other diseases classified elsewhere, moderate, with anxiety: Secondary | ICD-10-CM | POA: Diagnosis not present

## 2022-09-07 DIAGNOSIS — Z955 Presence of coronary angioplasty implant and graft: Secondary | ICD-10-CM | POA: Diagnosis not present

## 2022-09-07 DIAGNOSIS — F02B3 Dementia in other diseases classified elsewhere, moderate, with mood disturbance: Secondary | ICD-10-CM | POA: Diagnosis not present

## 2022-09-07 DIAGNOSIS — Z7901 Long term (current) use of anticoagulants: Secondary | ICD-10-CM | POA: Diagnosis not present

## 2022-09-07 DIAGNOSIS — I87323 Chronic venous hypertension (idiopathic) with inflammation of bilateral lower extremity: Secondary | ICD-10-CM | POA: Diagnosis not present

## 2022-09-07 DIAGNOSIS — I10 Essential (primary) hypertension: Secondary | ICD-10-CM | POA: Diagnosis not present

## 2022-09-08 DIAGNOSIS — I10 Essential (primary) hypertension: Secondary | ICD-10-CM | POA: Diagnosis not present

## 2022-09-08 DIAGNOSIS — Z955 Presence of coronary angioplasty implant and graft: Secondary | ICD-10-CM | POA: Diagnosis not present

## 2022-09-08 DIAGNOSIS — F329 Major depressive disorder, single episode, unspecified: Secondary | ICD-10-CM | POA: Diagnosis not present

## 2022-09-08 DIAGNOSIS — E440 Moderate protein-calorie malnutrition: Secondary | ICD-10-CM | POA: Diagnosis not present

## 2022-09-08 DIAGNOSIS — I87323 Chronic venous hypertension (idiopathic) with inflammation of bilateral lower extremity: Secondary | ICD-10-CM | POA: Diagnosis not present

## 2022-09-08 DIAGNOSIS — G309 Alzheimer's disease, unspecified: Secondary | ICD-10-CM | POA: Diagnosis not present

## 2022-09-08 DIAGNOSIS — F02B3 Dementia in other diseases classified elsewhere, moderate, with mood disturbance: Secondary | ICD-10-CM | POA: Diagnosis not present

## 2022-09-08 DIAGNOSIS — N179 Acute kidney failure, unspecified: Secondary | ICD-10-CM | POA: Diagnosis not present

## 2022-09-08 DIAGNOSIS — I2510 Atherosclerotic heart disease of native coronary artery without angina pectoris: Secondary | ICD-10-CM | POA: Diagnosis not present

## 2022-09-08 DIAGNOSIS — E782 Mixed hyperlipidemia: Secondary | ICD-10-CM | POA: Diagnosis not present

## 2022-09-08 DIAGNOSIS — Z7901 Long term (current) use of anticoagulants: Secondary | ICD-10-CM | POA: Diagnosis not present

## 2022-09-08 DIAGNOSIS — F4321 Adjustment disorder with depressed mood: Secondary | ICD-10-CM | POA: Diagnosis not present

## 2022-09-08 DIAGNOSIS — I890 Lymphedema, not elsewhere classified: Secondary | ICD-10-CM | POA: Diagnosis not present

## 2022-09-08 DIAGNOSIS — Z87891 Personal history of nicotine dependence: Secondary | ICD-10-CM | POA: Diagnosis not present

## 2022-09-08 DIAGNOSIS — F02B4 Dementia in other diseases classified elsewhere, moderate, with anxiety: Secondary | ICD-10-CM | POA: Diagnosis not present

## 2022-09-08 DIAGNOSIS — I4819 Other persistent atrial fibrillation: Secondary | ICD-10-CM | POA: Diagnosis not present

## 2022-09-11 DIAGNOSIS — F02B4 Dementia in other diseases classified elsewhere, moderate, with anxiety: Secondary | ICD-10-CM | POA: Diagnosis not present

## 2022-09-11 DIAGNOSIS — Z87891 Personal history of nicotine dependence: Secondary | ICD-10-CM | POA: Diagnosis not present

## 2022-09-11 DIAGNOSIS — I87323 Chronic venous hypertension (idiopathic) with inflammation of bilateral lower extremity: Secondary | ICD-10-CM | POA: Diagnosis not present

## 2022-09-11 DIAGNOSIS — F02B3 Dementia in other diseases classified elsewhere, moderate, with mood disturbance: Secondary | ICD-10-CM | POA: Diagnosis not present

## 2022-09-11 DIAGNOSIS — N179 Acute kidney failure, unspecified: Secondary | ICD-10-CM | POA: Diagnosis not present

## 2022-09-11 DIAGNOSIS — Z7901 Long term (current) use of anticoagulants: Secondary | ICD-10-CM | POA: Diagnosis not present

## 2022-09-11 DIAGNOSIS — G309 Alzheimer's disease, unspecified: Secondary | ICD-10-CM | POA: Diagnosis not present

## 2022-09-11 DIAGNOSIS — Z955 Presence of coronary angioplasty implant and graft: Secondary | ICD-10-CM | POA: Diagnosis not present

## 2022-09-11 DIAGNOSIS — F329 Major depressive disorder, single episode, unspecified: Secondary | ICD-10-CM | POA: Diagnosis not present

## 2022-09-11 DIAGNOSIS — I4819 Other persistent atrial fibrillation: Secondary | ICD-10-CM | POA: Diagnosis not present

## 2022-09-11 DIAGNOSIS — I10 Essential (primary) hypertension: Secondary | ICD-10-CM | POA: Diagnosis not present

## 2022-09-11 DIAGNOSIS — I2510 Atherosclerotic heart disease of native coronary artery without angina pectoris: Secondary | ICD-10-CM | POA: Diagnosis not present

## 2022-09-11 DIAGNOSIS — F4321 Adjustment disorder with depressed mood: Secondary | ICD-10-CM | POA: Diagnosis not present

## 2022-09-11 DIAGNOSIS — E782 Mixed hyperlipidemia: Secondary | ICD-10-CM | POA: Diagnosis not present

## 2022-09-11 DIAGNOSIS — E440 Moderate protein-calorie malnutrition: Secondary | ICD-10-CM | POA: Diagnosis not present

## 2022-09-11 DIAGNOSIS — I890 Lymphedema, not elsewhere classified: Secondary | ICD-10-CM | POA: Diagnosis not present

## 2022-09-12 DIAGNOSIS — F339 Major depressive disorder, recurrent, unspecified: Secondary | ICD-10-CM | POA: Diagnosis not present

## 2022-09-12 DIAGNOSIS — G309 Alzheimer's disease, unspecified: Secondary | ICD-10-CM | POA: Diagnosis not present

## 2022-09-12 DIAGNOSIS — F411 Generalized anxiety disorder: Secondary | ICD-10-CM | POA: Diagnosis not present

## 2022-09-12 DIAGNOSIS — R300 Dysuria: Secondary | ICD-10-CM | POA: Diagnosis not present

## 2022-09-15 DIAGNOSIS — I890 Lymphedema, not elsewhere classified: Secondary | ICD-10-CM | POA: Diagnosis not present

## 2022-09-15 DIAGNOSIS — I87323 Chronic venous hypertension (idiopathic) with inflammation of bilateral lower extremity: Secondary | ICD-10-CM | POA: Diagnosis not present

## 2022-09-15 DIAGNOSIS — I10 Essential (primary) hypertension: Secondary | ICD-10-CM | POA: Diagnosis not present

## 2022-09-19 DIAGNOSIS — G309 Alzheimer's disease, unspecified: Secondary | ICD-10-CM | POA: Diagnosis not present

## 2022-09-19 DIAGNOSIS — F329 Major depressive disorder, single episode, unspecified: Secondary | ICD-10-CM | POA: Diagnosis not present

## 2022-09-19 DIAGNOSIS — N179 Acute kidney failure, unspecified: Secondary | ICD-10-CM | POA: Diagnosis not present

## 2022-09-19 DIAGNOSIS — Z87891 Personal history of nicotine dependence: Secondary | ICD-10-CM | POA: Diagnosis not present

## 2022-09-19 DIAGNOSIS — Z955 Presence of coronary angioplasty implant and graft: Secondary | ICD-10-CM | POA: Diagnosis not present

## 2022-09-19 DIAGNOSIS — I890 Lymphedema, not elsewhere classified: Secondary | ICD-10-CM | POA: Diagnosis not present

## 2022-09-19 DIAGNOSIS — Z7901 Long term (current) use of anticoagulants: Secondary | ICD-10-CM | POA: Diagnosis not present

## 2022-09-19 DIAGNOSIS — F4321 Adjustment disorder with depressed mood: Secondary | ICD-10-CM | POA: Diagnosis not present

## 2022-09-19 DIAGNOSIS — E782 Mixed hyperlipidemia: Secondary | ICD-10-CM | POA: Diagnosis not present

## 2022-09-19 DIAGNOSIS — I4819 Other persistent atrial fibrillation: Secondary | ICD-10-CM | POA: Diagnosis not present

## 2022-09-19 DIAGNOSIS — I87323 Chronic venous hypertension (idiopathic) with inflammation of bilateral lower extremity: Secondary | ICD-10-CM | POA: Diagnosis not present

## 2022-09-19 DIAGNOSIS — I10 Essential (primary) hypertension: Secondary | ICD-10-CM | POA: Diagnosis not present

## 2022-09-19 DIAGNOSIS — F02B4 Dementia in other diseases classified elsewhere, moderate, with anxiety: Secondary | ICD-10-CM | POA: Diagnosis not present

## 2022-09-19 DIAGNOSIS — F02B3 Dementia in other diseases classified elsewhere, moderate, with mood disturbance: Secondary | ICD-10-CM | POA: Diagnosis not present

## 2022-09-19 DIAGNOSIS — E440 Moderate protein-calorie malnutrition: Secondary | ICD-10-CM | POA: Diagnosis not present

## 2022-09-19 DIAGNOSIS — I2510 Atherosclerotic heart disease of native coronary artery without angina pectoris: Secondary | ICD-10-CM | POA: Diagnosis not present

## 2022-09-22 DIAGNOSIS — I4819 Other persistent atrial fibrillation: Secondary | ICD-10-CM | POA: Diagnosis not present

## 2022-09-22 DIAGNOSIS — I2510 Atherosclerotic heart disease of native coronary artery without angina pectoris: Secondary | ICD-10-CM | POA: Diagnosis not present

## 2022-09-22 DIAGNOSIS — E440 Moderate protein-calorie malnutrition: Secondary | ICD-10-CM | POA: Diagnosis not present

## 2022-09-22 DIAGNOSIS — E782 Mixed hyperlipidemia: Secondary | ICD-10-CM | POA: Diagnosis not present

## 2022-09-22 DIAGNOSIS — F02B4 Dementia in other diseases classified elsewhere, moderate, with anxiety: Secondary | ICD-10-CM | POA: Diagnosis not present

## 2022-09-22 DIAGNOSIS — I87323 Chronic venous hypertension (idiopathic) with inflammation of bilateral lower extremity: Secondary | ICD-10-CM | POA: Diagnosis not present

## 2022-09-22 DIAGNOSIS — F4321 Adjustment disorder with depressed mood: Secondary | ICD-10-CM | POA: Diagnosis not present

## 2022-09-22 DIAGNOSIS — G309 Alzheimer's disease, unspecified: Secondary | ICD-10-CM | POA: Diagnosis not present

## 2022-09-22 DIAGNOSIS — F329 Major depressive disorder, single episode, unspecified: Secondary | ICD-10-CM | POA: Diagnosis not present

## 2022-09-22 DIAGNOSIS — Z955 Presence of coronary angioplasty implant and graft: Secondary | ICD-10-CM | POA: Diagnosis not present

## 2022-09-22 DIAGNOSIS — N179 Acute kidney failure, unspecified: Secondary | ICD-10-CM | POA: Diagnosis not present

## 2022-09-22 DIAGNOSIS — I10 Essential (primary) hypertension: Secondary | ICD-10-CM | POA: Diagnosis not present

## 2022-09-22 DIAGNOSIS — Z7901 Long term (current) use of anticoagulants: Secondary | ICD-10-CM | POA: Diagnosis not present

## 2022-09-22 DIAGNOSIS — I890 Lymphedema, not elsewhere classified: Secondary | ICD-10-CM | POA: Diagnosis not present

## 2022-09-22 DIAGNOSIS — F02B3 Dementia in other diseases classified elsewhere, moderate, with mood disturbance: Secondary | ICD-10-CM | POA: Diagnosis not present

## 2022-09-22 DIAGNOSIS — Z87891 Personal history of nicotine dependence: Secondary | ICD-10-CM | POA: Diagnosis not present

## 2022-09-23 DIAGNOSIS — E440 Moderate protein-calorie malnutrition: Secondary | ICD-10-CM | POA: Diagnosis not present

## 2022-09-23 DIAGNOSIS — N179 Acute kidney failure, unspecified: Secondary | ICD-10-CM | POA: Diagnosis not present

## 2022-09-23 DIAGNOSIS — I4819 Other persistent atrial fibrillation: Secondary | ICD-10-CM | POA: Diagnosis not present

## 2022-09-23 DIAGNOSIS — I890 Lymphedema, not elsewhere classified: Secondary | ICD-10-CM | POA: Diagnosis not present

## 2022-09-23 DIAGNOSIS — I2510 Atherosclerotic heart disease of native coronary artery without angina pectoris: Secondary | ICD-10-CM | POA: Diagnosis not present

## 2022-09-23 DIAGNOSIS — Z87891 Personal history of nicotine dependence: Secondary | ICD-10-CM | POA: Diagnosis not present

## 2022-09-23 DIAGNOSIS — F4321 Adjustment disorder with depressed mood: Secondary | ICD-10-CM | POA: Diagnosis not present

## 2022-09-23 DIAGNOSIS — I87323 Chronic venous hypertension (idiopathic) with inflammation of bilateral lower extremity: Secondary | ICD-10-CM | POA: Diagnosis not present

## 2022-09-23 DIAGNOSIS — F02B3 Dementia in other diseases classified elsewhere, moderate, with mood disturbance: Secondary | ICD-10-CM | POA: Diagnosis not present

## 2022-09-23 DIAGNOSIS — Z7901 Long term (current) use of anticoagulants: Secondary | ICD-10-CM | POA: Diagnosis not present

## 2022-09-23 DIAGNOSIS — G309 Alzheimer's disease, unspecified: Secondary | ICD-10-CM | POA: Diagnosis not present

## 2022-09-23 DIAGNOSIS — I10 Essential (primary) hypertension: Secondary | ICD-10-CM | POA: Diagnosis not present

## 2022-09-23 DIAGNOSIS — F329 Major depressive disorder, single episode, unspecified: Secondary | ICD-10-CM | POA: Diagnosis not present

## 2022-09-23 DIAGNOSIS — Z955 Presence of coronary angioplasty implant and graft: Secondary | ICD-10-CM | POA: Diagnosis not present

## 2022-09-23 DIAGNOSIS — F02B4 Dementia in other diseases classified elsewhere, moderate, with anxiety: Secondary | ICD-10-CM | POA: Diagnosis not present

## 2022-09-23 DIAGNOSIS — E782 Mixed hyperlipidemia: Secondary | ICD-10-CM | POA: Diagnosis not present

## 2022-09-25 DIAGNOSIS — I482 Chronic atrial fibrillation, unspecified: Secondary | ICD-10-CM | POA: Diagnosis not present

## 2022-09-25 DIAGNOSIS — R600 Localized edema: Secondary | ICD-10-CM | POA: Diagnosis not present

## 2022-09-25 DIAGNOSIS — I872 Venous insufficiency (chronic) (peripheral): Secondary | ICD-10-CM | POA: Diagnosis not present

## 2022-09-25 DIAGNOSIS — E782 Mixed hyperlipidemia: Secondary | ICD-10-CM | POA: Diagnosis not present

## 2022-09-26 DIAGNOSIS — F411 Generalized anxiety disorder: Secondary | ICD-10-CM | POA: Diagnosis not present

## 2022-09-26 DIAGNOSIS — I10 Essential (primary) hypertension: Secondary | ICD-10-CM | POA: Diagnosis not present

## 2022-09-29 DIAGNOSIS — F4321 Adjustment disorder with depressed mood: Secondary | ICD-10-CM | POA: Diagnosis not present

## 2022-09-29 DIAGNOSIS — E782 Mixed hyperlipidemia: Secondary | ICD-10-CM | POA: Diagnosis not present

## 2022-09-29 DIAGNOSIS — E440 Moderate protein-calorie malnutrition: Secondary | ICD-10-CM | POA: Diagnosis not present

## 2022-09-29 DIAGNOSIS — F02B3 Dementia in other diseases classified elsewhere, moderate, with mood disturbance: Secondary | ICD-10-CM | POA: Diagnosis not present

## 2022-09-29 DIAGNOSIS — I87323 Chronic venous hypertension (idiopathic) with inflammation of bilateral lower extremity: Secondary | ICD-10-CM | POA: Diagnosis not present

## 2022-09-29 DIAGNOSIS — Z7901 Long term (current) use of anticoagulants: Secondary | ICD-10-CM | POA: Diagnosis not present

## 2022-09-29 DIAGNOSIS — I890 Lymphedema, not elsewhere classified: Secondary | ICD-10-CM | POA: Diagnosis not present

## 2022-09-29 DIAGNOSIS — F02B4 Dementia in other diseases classified elsewhere, moderate, with anxiety: Secondary | ICD-10-CM | POA: Diagnosis not present

## 2022-09-29 DIAGNOSIS — G309 Alzheimer's disease, unspecified: Secondary | ICD-10-CM | POA: Diagnosis not present

## 2022-09-29 DIAGNOSIS — I10 Essential (primary) hypertension: Secondary | ICD-10-CM | POA: Diagnosis not present

## 2022-09-29 DIAGNOSIS — F329 Major depressive disorder, single episode, unspecified: Secondary | ICD-10-CM | POA: Diagnosis not present

## 2022-09-29 DIAGNOSIS — N179 Acute kidney failure, unspecified: Secondary | ICD-10-CM | POA: Diagnosis not present

## 2022-09-29 DIAGNOSIS — Z955 Presence of coronary angioplasty implant and graft: Secondary | ICD-10-CM | POA: Diagnosis not present

## 2022-09-29 DIAGNOSIS — I4819 Other persistent atrial fibrillation: Secondary | ICD-10-CM | POA: Diagnosis not present

## 2022-09-29 DIAGNOSIS — I2510 Atherosclerotic heart disease of native coronary artery without angina pectoris: Secondary | ICD-10-CM | POA: Diagnosis not present

## 2022-09-29 DIAGNOSIS — Z87891 Personal history of nicotine dependence: Secondary | ICD-10-CM | POA: Diagnosis not present

## 2022-10-05 DIAGNOSIS — F02B4 Dementia in other diseases classified elsewhere, moderate, with anxiety: Secondary | ICD-10-CM | POA: Diagnosis not present

## 2022-10-05 DIAGNOSIS — Z955 Presence of coronary angioplasty implant and graft: Secondary | ICD-10-CM | POA: Diagnosis not present

## 2022-10-05 DIAGNOSIS — I890 Lymphedema, not elsewhere classified: Secondary | ICD-10-CM | POA: Diagnosis not present

## 2022-10-05 DIAGNOSIS — E782 Mixed hyperlipidemia: Secondary | ICD-10-CM | POA: Diagnosis not present

## 2022-10-05 DIAGNOSIS — I87323 Chronic venous hypertension (idiopathic) with inflammation of bilateral lower extremity: Secondary | ICD-10-CM | POA: Diagnosis not present

## 2022-10-05 DIAGNOSIS — G309 Alzheimer's disease, unspecified: Secondary | ICD-10-CM | POA: Diagnosis not present

## 2022-10-05 DIAGNOSIS — I10 Essential (primary) hypertension: Secondary | ICD-10-CM | POA: Diagnosis not present

## 2022-10-05 DIAGNOSIS — N179 Acute kidney failure, unspecified: Secondary | ICD-10-CM | POA: Diagnosis not present

## 2022-10-05 DIAGNOSIS — Z7901 Long term (current) use of anticoagulants: Secondary | ICD-10-CM | POA: Diagnosis not present

## 2022-10-05 DIAGNOSIS — I4819 Other persistent atrial fibrillation: Secondary | ICD-10-CM | POA: Diagnosis not present

## 2022-10-05 DIAGNOSIS — F4321 Adjustment disorder with depressed mood: Secondary | ICD-10-CM | POA: Diagnosis not present

## 2022-10-05 DIAGNOSIS — I2510 Atherosclerotic heart disease of native coronary artery without angina pectoris: Secondary | ICD-10-CM | POA: Diagnosis not present

## 2022-10-05 DIAGNOSIS — E440 Moderate protein-calorie malnutrition: Secondary | ICD-10-CM | POA: Diagnosis not present

## 2022-10-05 DIAGNOSIS — F329 Major depressive disorder, single episode, unspecified: Secondary | ICD-10-CM | POA: Diagnosis not present

## 2022-10-05 DIAGNOSIS — Z87891 Personal history of nicotine dependence: Secondary | ICD-10-CM | POA: Diagnosis not present

## 2022-10-05 DIAGNOSIS — F02B3 Dementia in other diseases classified elsewhere, moderate, with mood disturbance: Secondary | ICD-10-CM | POA: Diagnosis not present

## 2022-10-09 DIAGNOSIS — I87323 Chronic venous hypertension (idiopathic) with inflammation of bilateral lower extremity: Secondary | ICD-10-CM | POA: Diagnosis not present

## 2022-10-09 DIAGNOSIS — R600 Localized edema: Secondary | ICD-10-CM | POA: Diagnosis not present

## 2022-10-09 DIAGNOSIS — I872 Venous insufficiency (chronic) (peripheral): Secondary | ICD-10-CM | POA: Diagnosis not present

## 2022-10-09 DIAGNOSIS — I890 Lymphedema, not elsewhere classified: Secondary | ICD-10-CM | POA: Diagnosis not present

## 2022-10-10 DIAGNOSIS — F339 Major depressive disorder, recurrent, unspecified: Secondary | ICD-10-CM | POA: Diagnosis not present

## 2022-10-10 DIAGNOSIS — G309 Alzheimer's disease, unspecified: Secondary | ICD-10-CM | POA: Diagnosis not present

## 2022-10-10 DIAGNOSIS — F411 Generalized anxiety disorder: Secondary | ICD-10-CM | POA: Diagnosis not present

## 2022-10-12 DIAGNOSIS — F329 Major depressive disorder, single episode, unspecified: Secondary | ICD-10-CM | POA: Diagnosis not present

## 2022-10-12 DIAGNOSIS — N179 Acute kidney failure, unspecified: Secondary | ICD-10-CM | POA: Diagnosis not present

## 2022-10-12 DIAGNOSIS — E782 Mixed hyperlipidemia: Secondary | ICD-10-CM | POA: Diagnosis not present

## 2022-10-12 DIAGNOSIS — I87323 Chronic venous hypertension (idiopathic) with inflammation of bilateral lower extremity: Secondary | ICD-10-CM | POA: Diagnosis not present

## 2022-10-12 DIAGNOSIS — G309 Alzheimer's disease, unspecified: Secondary | ICD-10-CM | POA: Diagnosis not present

## 2022-10-12 DIAGNOSIS — I2510 Atherosclerotic heart disease of native coronary artery without angina pectoris: Secondary | ICD-10-CM | POA: Diagnosis not present

## 2022-10-12 DIAGNOSIS — F02B4 Dementia in other diseases classified elsewhere, moderate, with anxiety: Secondary | ICD-10-CM | POA: Diagnosis not present

## 2022-10-12 DIAGNOSIS — I4819 Other persistent atrial fibrillation: Secondary | ICD-10-CM | POA: Diagnosis not present

## 2022-10-12 DIAGNOSIS — I890 Lymphedema, not elsewhere classified: Secondary | ICD-10-CM | POA: Diagnosis not present

## 2022-10-12 DIAGNOSIS — I10 Essential (primary) hypertension: Secondary | ICD-10-CM | POA: Diagnosis not present

## 2022-10-12 DIAGNOSIS — E440 Moderate protein-calorie malnutrition: Secondary | ICD-10-CM | POA: Diagnosis not present

## 2022-10-12 DIAGNOSIS — F4321 Adjustment disorder with depressed mood: Secondary | ICD-10-CM | POA: Diagnosis not present

## 2022-10-12 DIAGNOSIS — Z87891 Personal history of nicotine dependence: Secondary | ICD-10-CM | POA: Diagnosis not present

## 2022-10-12 DIAGNOSIS — Z7901 Long term (current) use of anticoagulants: Secondary | ICD-10-CM | POA: Diagnosis not present

## 2022-10-12 DIAGNOSIS — F02B3 Dementia in other diseases classified elsewhere, moderate, with mood disturbance: Secondary | ICD-10-CM | POA: Diagnosis not present

## 2022-10-12 DIAGNOSIS — Z955 Presence of coronary angioplasty implant and graft: Secondary | ICD-10-CM | POA: Diagnosis not present

## 2022-10-13 DIAGNOSIS — L97919 Non-pressure chronic ulcer of unspecified part of right lower leg with unspecified severity: Secondary | ICD-10-CM | POA: Diagnosis not present

## 2022-10-13 DIAGNOSIS — M79675 Pain in left toe(s): Secondary | ICD-10-CM | POA: Diagnosis not present

## 2022-10-13 DIAGNOSIS — L97929 Non-pressure chronic ulcer of unspecified part of left lower leg with unspecified severity: Secondary | ICD-10-CM | POA: Diagnosis not present

## 2022-10-13 DIAGNOSIS — I739 Peripheral vascular disease, unspecified: Secondary | ICD-10-CM | POA: Diagnosis not present

## 2022-10-13 DIAGNOSIS — B351 Tinea unguium: Secondary | ICD-10-CM | POA: Diagnosis not present

## 2022-10-19 DIAGNOSIS — G309 Alzheimer's disease, unspecified: Secondary | ICD-10-CM | POA: Diagnosis not present

## 2022-10-19 DIAGNOSIS — F4321 Adjustment disorder with depressed mood: Secondary | ICD-10-CM | POA: Diagnosis not present

## 2022-10-19 DIAGNOSIS — E782 Mixed hyperlipidemia: Secondary | ICD-10-CM | POA: Diagnosis not present

## 2022-10-19 DIAGNOSIS — F02B4 Dementia in other diseases classified elsewhere, moderate, with anxiety: Secondary | ICD-10-CM | POA: Diagnosis not present

## 2022-10-19 DIAGNOSIS — I890 Lymphedema, not elsewhere classified: Secondary | ICD-10-CM | POA: Diagnosis not present

## 2022-10-19 DIAGNOSIS — F02B3 Dementia in other diseases classified elsewhere, moderate, with mood disturbance: Secondary | ICD-10-CM | POA: Diagnosis not present

## 2022-10-19 DIAGNOSIS — I4819 Other persistent atrial fibrillation: Secondary | ICD-10-CM | POA: Diagnosis not present

## 2022-10-19 DIAGNOSIS — Z87891 Personal history of nicotine dependence: Secondary | ICD-10-CM | POA: Diagnosis not present

## 2022-10-19 DIAGNOSIS — Z7901 Long term (current) use of anticoagulants: Secondary | ICD-10-CM | POA: Diagnosis not present

## 2022-10-19 DIAGNOSIS — E440 Moderate protein-calorie malnutrition: Secondary | ICD-10-CM | POA: Diagnosis not present

## 2022-10-19 DIAGNOSIS — Z955 Presence of coronary angioplasty implant and graft: Secondary | ICD-10-CM | POA: Diagnosis not present

## 2022-10-19 DIAGNOSIS — I87323 Chronic venous hypertension (idiopathic) with inflammation of bilateral lower extremity: Secondary | ICD-10-CM | POA: Diagnosis not present

## 2022-10-19 DIAGNOSIS — N179 Acute kidney failure, unspecified: Secondary | ICD-10-CM | POA: Diagnosis not present

## 2022-10-19 DIAGNOSIS — I10 Essential (primary) hypertension: Secondary | ICD-10-CM | POA: Diagnosis not present

## 2022-10-19 DIAGNOSIS — F329 Major depressive disorder, single episode, unspecified: Secondary | ICD-10-CM | POA: Diagnosis not present

## 2022-10-19 DIAGNOSIS — I2510 Atherosclerotic heart disease of native coronary artery without angina pectoris: Secondary | ICD-10-CM | POA: Diagnosis not present

## 2022-10-20 DIAGNOSIS — G301 Alzheimer's disease with late onset: Secondary | ICD-10-CM | POA: Diagnosis not present

## 2022-10-20 DIAGNOSIS — I10 Essential (primary) hypertension: Secondary | ICD-10-CM | POA: Diagnosis not present

## 2022-10-23 NOTE — Progress Notes (Signed)
Cardiology Office Note:    Date:  10/25/2022  ID:  [name], DOB [birth year], MRN [id number] PCP: Doristine Bosworth, MD (Inactive)  [facility] Providers Cardiologist:  Tonny Bollman, MD       Patient Profile:      Coronary artery disease  s/p BMS to LAD in 03/2005 LHC 03/2005: pLAD 90 (PCI), pD1 30, oD2 25 and inf br 60, oSept Perf2 95, pMOM 25, RCA 25, oPL2 95, EF 65 Persistent atrial fibrillation Intol to beta-blocker, calcium channel blocker Aortic insufficiency Dilated ascending aorta TTE 06/2015: Mod LVH, EF 55-60, no RWMA, mild AI, mild to mod RAE  TTE 02/17/20: EF 65-70, mild to mod AI, Asc Aorta 44 mm TTE 08/10/21: EF 60-65, no RWMA, NL RVSF, NL PASP, mild LAE, trivial MR, mild AI, AV sclerosis, Ao root 41 mm, Asc Aorta 38 mm, RAP 3 Hyperlipidemia  Cranial Nerve VI Palsy (visual field deficit) Carotid US 07/2015: Bilat ICA < 50% Chronic leg edema, venous stasis        History of Present Illness:   [name] [name] is a 82 y.o. male who returns for follow up of CAD, AFib.He was last seen by Dr. Excell Seltzer 10/20/21. He is here today with his 2 daughters.  He continues to have significant issues with venous stasis.  He has had intensive therapy at the assisted living facility [facility]).  He has not had chest discomfort, shortness of breath, syncope, orthopnea.  He walks regularly with his walker without significant difficulty.  Review of Systems  Gastrointestinal:  Negative for hematochezia and melena.  Genitourinary:  Negative for hematuria.   see the HPI    Studies Reviewed:   EKG Interpretation  Date/Time:  Wednesday October 25 2022 10:25:51 EDT Ventricular Rate:  77 PR Interval:    QRS Duration: 104 QT Interval:  418 QTC Calculation: 473 R Axis:   48 Text Interpretation: Atrial fibrillation with premature ventricular or aberrantly conducted complexes Incomplete right bundle branch block Normal axis No change when compared to prior tracing  Confirmed by Tereso Newcomer [phone]) on 10/25/2022 10:49:14 AM    Risk Assessment/Calculations:    CHA2DS2-VASc Score = 3   This indicates a 3.2% annual risk of stroke. The patient's score is based upon: CHF History: 0 HTN History: 0 Diabetes History: 0 Stroke History: 0 Vascular Disease History: 1 Age Score: 2 Gender Score: 0            Physical Exam:   VS:  BP 130/60   Pulse 77   Ht 5\' 11"  (1.803 m)   Wt 211 lb 6.4 oz (95.9 kg)   SpO2 96%   BMI 29.48 kg/m    Wt Readings from Last 3 Encounters:  10/25/22 211 lb 6.4 oz (95.9 kg)  10/20/21 171 lb (77.6 kg)  09/05/21 175 lb 7.8 oz (79.6 kg)    Constitutional:      Appearance: Healthy appearance. Not in distress.  Neck:     Vascular: No carotid bruit or JVR. JVD normal.  Pulmonary:     Breath sounds: Normal breath sounds. No wheezing. No rales.  Cardiovascular:     Normal rate. Irregularly irregular rhythm.     Murmurs: There is no murmur.  Edema:    Peripheral edema present.    Pretibial: bilateral 3+ edema of the pretibial area.    Ankle: bilateral 3+ edema of the ankle.    Feet: bilateral 3+ edema of the feet. Abdominal:     Palpations:  Abdomen is soft.       ASSESSMENT AND PLAN:   Coronary artery disease involving native coronary artery of native heart without angina pectoris History of BMS to the LAD in 2006.  He is doing well without chest discomfort to suggest angina.  He does not require antiplatelet therapy as he is on Eliquis.  Continue Lipitor 10 mg daily.  Follow-up 1 year.  Persistent atrial fibrillation (HCC) He remains in atrial fibrillation on electrocardiogram today.  His heart rate is controlled.  He is tolerating anticoagulation.  We will request his most recent labs from his living facility.  Continue Eliquis 5 mg twice daily, atenolol 25 mg as needed for elevated heart rates.  Hyperlipidemia Continue Lipitor 10 g daily.  Obtain recent labs from his living facility.  Venous stasis dermatitis  of both lower extremities He continues to have significant issues with this.  He has intensive therapy at his living facility to help control this.  He does not have any current wounds.  Question if he would benefit from pneumatic compression devices.  Aortic insufficiency Echocardiogram in April 2023 demonstrated mild AI.  He also has mildly dilated aortic root at 41 mm and ascending aorta at 38 mm.  Consider repeat echocardiogram in 1 to 2 years.     Dispo:  Return in about 1 year (around 10/25/2023) for Routine Follow Up, w/ Dr. Excell Seltzer, or Tereso Newcomer, PA-C.  Signed, Tereso Newcomer, PA-C

## 2022-10-24 DIAGNOSIS — R600 Localized edema: Secondary | ICD-10-CM | POA: Diagnosis not present

## 2022-10-24 DIAGNOSIS — I872 Venous insufficiency (chronic) (peripheral): Secondary | ICD-10-CM | POA: Diagnosis not present

## 2022-10-24 DIAGNOSIS — E782 Mixed hyperlipidemia: Secondary | ICD-10-CM | POA: Diagnosis not present

## 2022-10-24 DIAGNOSIS — I482 Chronic atrial fibrillation, unspecified: Secondary | ICD-10-CM | POA: Diagnosis not present

## 2022-10-25 ENCOUNTER — Ambulatory Visit: Payer: Medicare Other | Attending: Physician Assistant | Admitting: Physician Assistant

## 2022-10-25 ENCOUNTER — Encounter: Payer: Self-pay | Admitting: Physician Assistant

## 2022-10-25 VITALS — BP 130/60 | HR 77 | Ht 71.0 in | Wt 211.4 lb

## 2022-10-25 DIAGNOSIS — I2510 Atherosclerotic heart disease of native coronary artery without angina pectoris: Secondary | ICD-10-CM | POA: Diagnosis not present

## 2022-10-25 DIAGNOSIS — I872 Venous insufficiency (chronic) (peripheral): Secondary | ICD-10-CM

## 2022-10-25 DIAGNOSIS — I4819 Other persistent atrial fibrillation: Secondary | ICD-10-CM | POA: Diagnosis not present

## 2022-10-25 DIAGNOSIS — E7800 Pure hypercholesterolemia, unspecified: Secondary | ICD-10-CM | POA: Diagnosis not present

## 2022-10-25 DIAGNOSIS — I351 Nonrheumatic aortic (valve) insufficiency: Secondary | ICD-10-CM | POA: Insufficient documentation

## 2022-10-25 NOTE — Assessment & Plan Note (Signed)
History of BMS to the LAD in 2006.  He is doing well without chest discomfort to suggest angina.  He does not require antiplatelet therapy as he is on Eliquis.  Continue Lipitor 10 mg daily.  Follow-up 1 year.

## 2022-10-25 NOTE — Patient Instructions (Signed)
Medication Instructions:  Your physician recommends that you continue on your current medications as directed. Please refer to the Current Medication list given to you today.  *If you need a refill on your cardiac medications before your next appointment, please call your pharmacy*   Lab Work: None ordered  If you have labs (blood work) drawn today and your tests are completely normal, you will receive your results only by: MyChart Message (if you have MyChart) OR A paper copy in the mail If you have any lab test that is abnormal or we need to change your treatment, we will call you to review the results.   Testing/Procedures: None ordered   Follow-Up: At [facility], you and your health needs are our priority.  As part of our continuing mission to provide you with exceptional heart care, we have created designated Provider Care Teams.  These Care Teams include your primary Cardiologist (physician) and Advanced Practice Providers (APPs -  Physician Assistants and Nurse Practitioners) who all work together to provide you with the care you need, when you need it.  We recommend signing up for the patient portal called "MyChart".  Sign up information is provided on this After Visit Summary.  MyChart is used to connect with patients for Virtual Visits (Telemedicine).  Patients are able to view lab/test results, encounter notes, upcoming appointments, etc.  Non-urgent messages can be sent to your provider as well.   To learn more about what you can do with MyChart, go to [email or web address].    Your next appointment:   1 year(s)  Provider:   Michael Cooper, MD     Other Instructions   

## 2022-10-25 NOTE — Assessment & Plan Note (Signed)
Echocardiogram in April 2023 demonstrated mild AI.  He also has mildly dilated aortic root at 41 mm and ascending aorta at 38 mm.  Consider repeat echocardiogram in 1 to 2 years.

## 2022-10-25 NOTE — Assessment & Plan Note (Signed)
He continues to have significant issues with this.  He has intensive therapy at his living facility to help control this.  He does not have any current wounds.  Question if he would benefit from pneumatic compression devices.

## 2022-10-25 NOTE — Assessment & Plan Note (Signed)
He remains in atrial fibrillation on electrocardiogram today.  His heart rate is controlled.  He is tolerating anticoagulation.  We will request his most recent labs from his living facility.  Continue Eliquis 5 mg twice daily, atenolol 25 mg as needed for elevated heart rates.

## 2022-10-25 NOTE — Assessment & Plan Note (Signed)
Continue Lipitor 10 g daily.  Obtain recent labs from his living facility.

## 2022-11-06 DIAGNOSIS — F4329 Adjustment disorder with other symptoms: Secondary | ICD-10-CM | POA: Diagnosis not present

## 2022-11-06 DIAGNOSIS — E782 Mixed hyperlipidemia: Secondary | ICD-10-CM | POA: Diagnosis not present

## 2022-11-06 DIAGNOSIS — N179 Acute kidney failure, unspecified: Secondary | ICD-10-CM | POA: Diagnosis not present

## 2022-11-06 DIAGNOSIS — I2510 Atherosclerotic heart disease of native coronary artery without angina pectoris: Secondary | ICD-10-CM | POA: Diagnosis not present

## 2022-11-06 DIAGNOSIS — I83893 Varicose veins of bilateral lower extremities with other complications: Secondary | ICD-10-CM | POA: Diagnosis not present

## 2022-11-06 DIAGNOSIS — I4819 Other persistent atrial fibrillation: Secondary | ICD-10-CM | POA: Diagnosis not present

## 2022-11-06 DIAGNOSIS — E440 Moderate protein-calorie malnutrition: Secondary | ICD-10-CM | POA: Diagnosis not present

## 2022-11-06 DIAGNOSIS — L239 Allergic contact dermatitis, unspecified cause: Secondary | ICD-10-CM | POA: Diagnosis not present

## 2022-11-06 DIAGNOSIS — E559 Vitamin D deficiency, unspecified: Secondary | ICD-10-CM | POA: Diagnosis not present

## 2022-11-06 DIAGNOSIS — R32 Unspecified urinary incontinence: Secondary | ICD-10-CM | POA: Diagnosis not present

## 2022-11-06 DIAGNOSIS — L97819 Non-pressure chronic ulcer of other part of right lower leg with unspecified severity: Secondary | ICD-10-CM | POA: Diagnosis not present

## 2022-11-06 DIAGNOSIS — F03B2 Unspecified dementia, moderate, with psychotic disturbance: Secondary | ICD-10-CM | POA: Diagnosis not present

## 2022-11-06 DIAGNOSIS — Z7901 Long term (current) use of anticoagulants: Secondary | ICD-10-CM | POA: Diagnosis not present

## 2022-11-06 DIAGNOSIS — I739 Peripheral vascular disease, unspecified: Secondary | ICD-10-CM | POA: Diagnosis not present

## 2022-11-06 DIAGNOSIS — I872 Venous insufficiency (chronic) (peripheral): Secondary | ICD-10-CM | POA: Diagnosis not present

## 2022-11-07 DIAGNOSIS — F339 Major depressive disorder, recurrent, unspecified: Secondary | ICD-10-CM | POA: Diagnosis not present

## 2022-11-07 DIAGNOSIS — G301 Alzheimer's disease with late onset: Secondary | ICD-10-CM | POA: Diagnosis not present

## 2022-11-07 DIAGNOSIS — F411 Generalized anxiety disorder: Secondary | ICD-10-CM | POA: Diagnosis not present

## 2022-11-09 DIAGNOSIS — I2510 Atherosclerotic heart disease of native coronary artery without angina pectoris: Secondary | ICD-10-CM | POA: Diagnosis not present

## 2022-11-09 DIAGNOSIS — R32 Unspecified urinary incontinence: Secondary | ICD-10-CM | POA: Diagnosis not present

## 2022-11-09 DIAGNOSIS — I739 Peripheral vascular disease, unspecified: Secondary | ICD-10-CM | POA: Diagnosis not present

## 2022-11-09 DIAGNOSIS — L97819 Non-pressure chronic ulcer of other part of right lower leg with unspecified severity: Secondary | ICD-10-CM | POA: Diagnosis not present

## 2022-11-09 DIAGNOSIS — I83893 Varicose veins of bilateral lower extremities with other complications: Secondary | ICD-10-CM | POA: Diagnosis not present

## 2022-11-09 DIAGNOSIS — E782 Mixed hyperlipidemia: Secondary | ICD-10-CM | POA: Diagnosis not present

## 2022-11-09 DIAGNOSIS — L239 Allergic contact dermatitis, unspecified cause: Secondary | ICD-10-CM | POA: Diagnosis not present

## 2022-11-09 DIAGNOSIS — E440 Moderate protein-calorie malnutrition: Secondary | ICD-10-CM | POA: Diagnosis not present

## 2022-11-09 DIAGNOSIS — I872 Venous insufficiency (chronic) (peripheral): Secondary | ICD-10-CM | POA: Diagnosis not present

## 2022-11-09 DIAGNOSIS — F03B2 Unspecified dementia, moderate, with psychotic disturbance: Secondary | ICD-10-CM | POA: Diagnosis not present

## 2022-11-09 DIAGNOSIS — E559 Vitamin D deficiency, unspecified: Secondary | ICD-10-CM | POA: Diagnosis not present

## 2022-11-09 DIAGNOSIS — F4329 Adjustment disorder with other symptoms: Secondary | ICD-10-CM | POA: Diagnosis not present

## 2022-11-09 DIAGNOSIS — N179 Acute kidney failure, unspecified: Secondary | ICD-10-CM | POA: Diagnosis not present

## 2022-11-09 DIAGNOSIS — I4819 Other persistent atrial fibrillation: Secondary | ICD-10-CM | POA: Diagnosis not present

## 2022-11-09 DIAGNOSIS — Z7901 Long term (current) use of anticoagulants: Secondary | ICD-10-CM | POA: Diagnosis not present

## 2022-11-10 DIAGNOSIS — F419 Anxiety disorder, unspecified: Secondary | ICD-10-CM | POA: Diagnosis not present

## 2022-11-10 DIAGNOSIS — F329 Major depressive disorder, single episode, unspecified: Secondary | ICD-10-CM | POA: Diagnosis not present

## 2022-11-13 DIAGNOSIS — L239 Allergic contact dermatitis, unspecified cause: Secondary | ICD-10-CM | POA: Diagnosis not present

## 2022-11-13 DIAGNOSIS — N179 Acute kidney failure, unspecified: Secondary | ICD-10-CM | POA: Diagnosis not present

## 2022-11-13 DIAGNOSIS — I739 Peripheral vascular disease, unspecified: Secondary | ICD-10-CM | POA: Diagnosis not present

## 2022-11-13 DIAGNOSIS — F03B2 Unspecified dementia, moderate, with psychotic disturbance: Secondary | ICD-10-CM | POA: Diagnosis not present

## 2022-11-13 DIAGNOSIS — E782 Mixed hyperlipidemia: Secondary | ICD-10-CM | POA: Diagnosis not present

## 2022-11-13 DIAGNOSIS — F4329 Adjustment disorder with other symptoms: Secondary | ICD-10-CM | POA: Diagnosis not present

## 2022-11-13 DIAGNOSIS — I2510 Atherosclerotic heart disease of native coronary artery without angina pectoris: Secondary | ICD-10-CM | POA: Diagnosis not present

## 2022-11-13 DIAGNOSIS — Z7901 Long term (current) use of anticoagulants: Secondary | ICD-10-CM | POA: Diagnosis not present

## 2022-11-13 DIAGNOSIS — L97819 Non-pressure chronic ulcer of other part of right lower leg with unspecified severity: Secondary | ICD-10-CM | POA: Diagnosis not present

## 2022-11-13 DIAGNOSIS — E559 Vitamin D deficiency, unspecified: Secondary | ICD-10-CM | POA: Diagnosis not present

## 2022-11-13 DIAGNOSIS — I83893 Varicose veins of bilateral lower extremities with other complications: Secondary | ICD-10-CM | POA: Diagnosis not present

## 2022-11-13 DIAGNOSIS — I4819 Other persistent atrial fibrillation: Secondary | ICD-10-CM | POA: Diagnosis not present

## 2022-11-13 DIAGNOSIS — I872 Venous insufficiency (chronic) (peripheral): Secondary | ICD-10-CM | POA: Diagnosis not present

## 2022-11-13 DIAGNOSIS — R32 Unspecified urinary incontinence: Secondary | ICD-10-CM | POA: Diagnosis not present

## 2022-11-13 DIAGNOSIS — E440 Moderate protein-calorie malnutrition: Secondary | ICD-10-CM | POA: Diagnosis not present

## 2022-11-20 DIAGNOSIS — L97819 Non-pressure chronic ulcer of other part of right lower leg with unspecified severity: Secondary | ICD-10-CM | POA: Diagnosis not present

## 2022-11-20 DIAGNOSIS — I4819 Other persistent atrial fibrillation: Secondary | ICD-10-CM | POA: Diagnosis not present

## 2022-11-20 DIAGNOSIS — E782 Mixed hyperlipidemia: Secondary | ICD-10-CM | POA: Diagnosis not present

## 2022-11-20 DIAGNOSIS — F4329 Adjustment disorder with other symptoms: Secondary | ICD-10-CM | POA: Diagnosis not present

## 2022-11-20 DIAGNOSIS — N179 Acute kidney failure, unspecified: Secondary | ICD-10-CM | POA: Diagnosis not present

## 2022-11-20 DIAGNOSIS — I872 Venous insufficiency (chronic) (peripheral): Secondary | ICD-10-CM | POA: Diagnosis not present

## 2022-11-20 DIAGNOSIS — I482 Chronic atrial fibrillation, unspecified: Secondary | ICD-10-CM | POA: Diagnosis not present

## 2022-11-20 DIAGNOSIS — L239 Allergic contact dermatitis, unspecified cause: Secondary | ICD-10-CM | POA: Diagnosis not present

## 2022-11-20 DIAGNOSIS — E440 Moderate protein-calorie malnutrition: Secondary | ICD-10-CM | POA: Diagnosis not present

## 2022-11-20 DIAGNOSIS — R32 Unspecified urinary incontinence: Secondary | ICD-10-CM | POA: Diagnosis not present

## 2022-11-20 DIAGNOSIS — I83893 Varicose veins of bilateral lower extremities with other complications: Secondary | ICD-10-CM | POA: Diagnosis not present

## 2022-11-20 DIAGNOSIS — R600 Localized edema: Secondary | ICD-10-CM | POA: Diagnosis not present

## 2022-11-20 DIAGNOSIS — Z7901 Long term (current) use of anticoagulants: Secondary | ICD-10-CM | POA: Diagnosis not present

## 2022-11-20 DIAGNOSIS — F03B2 Unspecified dementia, moderate, with psychotic disturbance: Secondary | ICD-10-CM | POA: Diagnosis not present

## 2022-11-20 DIAGNOSIS — I2510 Atherosclerotic heart disease of native coronary artery without angina pectoris: Secondary | ICD-10-CM | POA: Diagnosis not present

## 2022-11-20 DIAGNOSIS — I739 Peripheral vascular disease, unspecified: Secondary | ICD-10-CM | POA: Diagnosis not present

## 2022-11-20 DIAGNOSIS — E559 Vitamin D deficiency, unspecified: Secondary | ICD-10-CM | POA: Diagnosis not present

## 2022-11-23 DIAGNOSIS — F03B2 Unspecified dementia, moderate, with psychotic disturbance: Secondary | ICD-10-CM | POA: Diagnosis not present

## 2022-11-23 DIAGNOSIS — I2510 Atherosclerotic heart disease of native coronary artery without angina pectoris: Secondary | ICD-10-CM | POA: Diagnosis not present

## 2022-11-23 DIAGNOSIS — R32 Unspecified urinary incontinence: Secondary | ICD-10-CM | POA: Diagnosis not present

## 2022-11-23 DIAGNOSIS — N179 Acute kidney failure, unspecified: Secondary | ICD-10-CM | POA: Diagnosis not present

## 2022-11-23 DIAGNOSIS — L97819 Non-pressure chronic ulcer of other part of right lower leg with unspecified severity: Secondary | ICD-10-CM | POA: Diagnosis not present

## 2022-11-23 DIAGNOSIS — I739 Peripheral vascular disease, unspecified: Secondary | ICD-10-CM | POA: Diagnosis not present

## 2022-11-23 DIAGNOSIS — E559 Vitamin D deficiency, unspecified: Secondary | ICD-10-CM | POA: Diagnosis not present

## 2022-11-23 DIAGNOSIS — F4329 Adjustment disorder with other symptoms: Secondary | ICD-10-CM | POA: Diagnosis not present

## 2022-11-23 DIAGNOSIS — E782 Mixed hyperlipidemia: Secondary | ICD-10-CM | POA: Diagnosis not present

## 2022-11-23 DIAGNOSIS — L239 Allergic contact dermatitis, unspecified cause: Secondary | ICD-10-CM | POA: Diagnosis not present

## 2022-11-23 DIAGNOSIS — I872 Venous insufficiency (chronic) (peripheral): Secondary | ICD-10-CM | POA: Diagnosis not present

## 2022-11-23 DIAGNOSIS — I4819 Other persistent atrial fibrillation: Secondary | ICD-10-CM | POA: Diagnosis not present

## 2022-11-23 DIAGNOSIS — E440 Moderate protein-calorie malnutrition: Secondary | ICD-10-CM | POA: Diagnosis not present

## 2022-11-23 DIAGNOSIS — I83893 Varicose veins of bilateral lower extremities with other complications: Secondary | ICD-10-CM | POA: Diagnosis not present

## 2022-11-23 DIAGNOSIS — Z7901 Long term (current) use of anticoagulants: Secondary | ICD-10-CM | POA: Diagnosis not present

## 2022-11-29 DIAGNOSIS — I2510 Atherosclerotic heart disease of native coronary artery without angina pectoris: Secondary | ICD-10-CM | POA: Diagnosis not present

## 2022-11-29 DIAGNOSIS — I872 Venous insufficiency (chronic) (peripheral): Secondary | ICD-10-CM | POA: Diagnosis not present

## 2022-11-29 DIAGNOSIS — R32 Unspecified urinary incontinence: Secondary | ICD-10-CM | POA: Diagnosis not present

## 2022-11-29 DIAGNOSIS — F4329 Adjustment disorder with other symptoms: Secondary | ICD-10-CM | POA: Diagnosis not present

## 2022-11-29 DIAGNOSIS — E782 Mixed hyperlipidemia: Secondary | ICD-10-CM | POA: Diagnosis not present

## 2022-11-29 DIAGNOSIS — N179 Acute kidney failure, unspecified: Secondary | ICD-10-CM | POA: Diagnosis not present

## 2022-11-29 DIAGNOSIS — L97819 Non-pressure chronic ulcer of other part of right lower leg with unspecified severity: Secondary | ICD-10-CM | POA: Diagnosis not present

## 2022-11-29 DIAGNOSIS — L239 Allergic contact dermatitis, unspecified cause: Secondary | ICD-10-CM | POA: Diagnosis not present

## 2022-11-29 DIAGNOSIS — I739 Peripheral vascular disease, unspecified: Secondary | ICD-10-CM | POA: Diagnosis not present

## 2022-11-29 DIAGNOSIS — I83893 Varicose veins of bilateral lower extremities with other complications: Secondary | ICD-10-CM | POA: Diagnosis not present

## 2022-11-29 DIAGNOSIS — Z7901 Long term (current) use of anticoagulants: Secondary | ICD-10-CM | POA: Diagnosis not present

## 2022-11-29 DIAGNOSIS — E440 Moderate protein-calorie malnutrition: Secondary | ICD-10-CM | POA: Diagnosis not present

## 2022-11-29 DIAGNOSIS — I4819 Other persistent atrial fibrillation: Secondary | ICD-10-CM | POA: Diagnosis not present

## 2022-11-29 DIAGNOSIS — E559 Vitamin D deficiency, unspecified: Secondary | ICD-10-CM | POA: Diagnosis not present

## 2022-11-29 DIAGNOSIS — F03B2 Unspecified dementia, moderate, with psychotic disturbance: Secondary | ICD-10-CM | POA: Diagnosis not present

## 2022-12-05 DIAGNOSIS — G309 Alzheimer's disease, unspecified: Secondary | ICD-10-CM | POA: Diagnosis not present

## 2022-12-05 DIAGNOSIS — R2689 Other abnormalities of gait and mobility: Secondary | ICD-10-CM | POA: Diagnosis not present

## 2022-12-05 DIAGNOSIS — F411 Generalized anxiety disorder: Secondary | ICD-10-CM | POA: Diagnosis not present

## 2022-12-05 DIAGNOSIS — G8929 Other chronic pain: Secondary | ICD-10-CM | POA: Diagnosis not present

## 2022-12-05 DIAGNOSIS — F339 Major depressive disorder, recurrent, unspecified: Secondary | ICD-10-CM | POA: Diagnosis not present

## 2022-12-05 DIAGNOSIS — I872 Venous insufficiency (chronic) (peripheral): Secondary | ICD-10-CM | POA: Diagnosis not present

## 2022-12-07 DIAGNOSIS — I872 Venous insufficiency (chronic) (peripheral): Secondary | ICD-10-CM | POA: Diagnosis not present

## 2022-12-07 DIAGNOSIS — R2689 Other abnormalities of gait and mobility: Secondary | ICD-10-CM | POA: Diagnosis not present

## 2022-12-12 DIAGNOSIS — R2689 Other abnormalities of gait and mobility: Secondary | ICD-10-CM | POA: Diagnosis not present

## 2022-12-12 DIAGNOSIS — I872 Venous insufficiency (chronic) (peripheral): Secondary | ICD-10-CM | POA: Diagnosis not present

## 2022-12-14 DIAGNOSIS — R2689 Other abnormalities of gait and mobility: Secondary | ICD-10-CM | POA: Diagnosis not present

## 2022-12-14 DIAGNOSIS — I872 Venous insufficiency (chronic) (peripheral): Secondary | ICD-10-CM | POA: Diagnosis not present

## 2022-12-18 DIAGNOSIS — E782 Mixed hyperlipidemia: Secondary | ICD-10-CM | POA: Diagnosis not present

## 2022-12-18 DIAGNOSIS — I482 Chronic atrial fibrillation, unspecified: Secondary | ICD-10-CM | POA: Diagnosis not present

## 2022-12-18 DIAGNOSIS — I872 Venous insufficiency (chronic) (peripheral): Secondary | ICD-10-CM | POA: Diagnosis not present

## 2022-12-18 DIAGNOSIS — R600 Localized edema: Secondary | ICD-10-CM | POA: Diagnosis not present

## 2022-12-19 DIAGNOSIS — R2689 Other abnormalities of gait and mobility: Secondary | ICD-10-CM | POA: Diagnosis not present

## 2022-12-19 DIAGNOSIS — I872 Venous insufficiency (chronic) (peripheral): Secondary | ICD-10-CM | POA: Diagnosis not present

## 2022-12-20 DIAGNOSIS — I872 Venous insufficiency (chronic) (peripheral): Secondary | ICD-10-CM | POA: Diagnosis not present

## 2022-12-20 DIAGNOSIS — I83893 Varicose veins of bilateral lower extremities with other complications: Secondary | ICD-10-CM | POA: Diagnosis not present

## 2022-12-20 DIAGNOSIS — L97819 Non-pressure chronic ulcer of other part of right lower leg with unspecified severity: Secondary | ICD-10-CM | POA: Diagnosis not present

## 2022-12-21 DIAGNOSIS — R2689 Other abnormalities of gait and mobility: Secondary | ICD-10-CM | POA: Diagnosis not present

## 2022-12-21 DIAGNOSIS — I872 Venous insufficiency (chronic) (peripheral): Secondary | ICD-10-CM | POA: Diagnosis not present

## 2022-12-25 DIAGNOSIS — I4891 Unspecified atrial fibrillation: Secondary | ICD-10-CM | POA: Diagnosis not present

## 2022-12-25 DIAGNOSIS — I872 Venous insufficiency (chronic) (peripheral): Secondary | ICD-10-CM | POA: Diagnosis not present

## 2022-12-25 DIAGNOSIS — I87323 Chronic venous hypertension (idiopathic) with inflammation of bilateral lower extremity: Secondary | ICD-10-CM | POA: Diagnosis not present

## 2022-12-25 DIAGNOSIS — R600 Localized edema: Secondary | ICD-10-CM | POA: Diagnosis not present

## 2022-12-26 DIAGNOSIS — L570 Actinic keratosis: Secondary | ICD-10-CM | POA: Diagnosis not present

## 2022-12-26 DIAGNOSIS — L821 Other seborrheic keratosis: Secondary | ICD-10-CM | POA: Diagnosis not present

## 2022-12-26 DIAGNOSIS — L814 Other melanin hyperpigmentation: Secondary | ICD-10-CM | POA: Diagnosis not present

## 2022-12-26 DIAGNOSIS — R2689 Other abnormalities of gait and mobility: Secondary | ICD-10-CM | POA: Diagnosis not present

## 2022-12-26 DIAGNOSIS — I872 Venous insufficiency (chronic) (peripheral): Secondary | ICD-10-CM | POA: Diagnosis not present

## 2022-12-28 DIAGNOSIS — I872 Venous insufficiency (chronic) (peripheral): Secondary | ICD-10-CM | POA: Diagnosis not present

## 2022-12-28 DIAGNOSIS — R2689 Other abnormalities of gait and mobility: Secondary | ICD-10-CM | POA: Diagnosis not present

## 2022-12-29 DIAGNOSIS — F329 Major depressive disorder, single episode, unspecified: Secondary | ICD-10-CM | POA: Diagnosis not present

## 2022-12-29 DIAGNOSIS — F419 Anxiety disorder, unspecified: Secondary | ICD-10-CM | POA: Diagnosis not present

## 2023-01-01 DIAGNOSIS — F411 Generalized anxiety disorder: Secondary | ICD-10-CM | POA: Diagnosis not present

## 2023-01-01 DIAGNOSIS — G309 Alzheimer's disease, unspecified: Secondary | ICD-10-CM | POA: Diagnosis not present

## 2023-01-01 DIAGNOSIS — F331 Major depressive disorder, recurrent, moderate: Secondary | ICD-10-CM | POA: Diagnosis not present

## 2023-01-02 DIAGNOSIS — R2689 Other abnormalities of gait and mobility: Secondary | ICD-10-CM | POA: Diagnosis not present

## 2023-01-03 DIAGNOSIS — R2689 Other abnormalities of gait and mobility: Secondary | ICD-10-CM | POA: Diagnosis not present

## 2023-01-08 DIAGNOSIS — R2689 Other abnormalities of gait and mobility: Secondary | ICD-10-CM | POA: Diagnosis not present

## 2023-01-12 DIAGNOSIS — M79675 Pain in left toe(s): Secondary | ICD-10-CM | POA: Diagnosis not present

## 2023-01-12 DIAGNOSIS — B351 Tinea unguium: Secondary | ICD-10-CM | POA: Diagnosis not present

## 2023-01-12 DIAGNOSIS — I739 Peripheral vascular disease, unspecified: Secondary | ICD-10-CM | POA: Diagnosis not present

## 2023-01-15 DIAGNOSIS — R600 Localized edema: Secondary | ICD-10-CM | POA: Diagnosis not present

## 2023-01-15 DIAGNOSIS — I482 Chronic atrial fibrillation, unspecified: Secondary | ICD-10-CM | POA: Diagnosis not present

## 2023-01-15 DIAGNOSIS — I872 Venous insufficiency (chronic) (peripheral): Secondary | ICD-10-CM | POA: Diagnosis not present

## 2023-01-15 DIAGNOSIS — E782 Mixed hyperlipidemia: Secondary | ICD-10-CM | POA: Diagnosis not present

## 2023-01-18 DIAGNOSIS — E782 Mixed hyperlipidemia: Secondary | ICD-10-CM | POA: Diagnosis not present

## 2023-01-18 DIAGNOSIS — I482 Chronic atrial fibrillation, unspecified: Secondary | ICD-10-CM | POA: Diagnosis not present

## 2023-01-18 DIAGNOSIS — E875 Hyperkalemia: Secondary | ICD-10-CM | POA: Diagnosis not present

## 2023-01-18 DIAGNOSIS — I87332 Chronic venous hypertension (idiopathic) with ulcer and inflammation of left lower extremity: Secondary | ICD-10-CM | POA: Diagnosis not present

## 2023-01-18 DIAGNOSIS — I890 Lymphedema, not elsewhere classified: Secondary | ICD-10-CM | POA: Diagnosis not present

## 2023-01-18 DIAGNOSIS — Z7901 Long term (current) use of anticoagulants: Secondary | ICD-10-CM | POA: Diagnosis not present

## 2023-01-18 DIAGNOSIS — I739 Peripheral vascular disease, unspecified: Secondary | ICD-10-CM | POA: Diagnosis not present

## 2023-01-18 DIAGNOSIS — F0390 Unspecified dementia without behavioral disturbance: Secondary | ICD-10-CM | POA: Diagnosis not present

## 2023-01-18 DIAGNOSIS — L97828 Non-pressure chronic ulcer of other part of left lower leg with other specified severity: Secondary | ICD-10-CM | POA: Diagnosis not present

## 2023-01-18 DIAGNOSIS — I87321 Chronic venous hypertension (idiopathic) with inflammation of right lower extremity: Secondary | ICD-10-CM | POA: Diagnosis not present

## 2023-01-18 DIAGNOSIS — F4321 Adjustment disorder with depressed mood: Secondary | ICD-10-CM | POA: Diagnosis not present

## 2023-01-18 DIAGNOSIS — N179 Acute kidney failure, unspecified: Secondary | ICD-10-CM | POA: Diagnosis not present

## 2023-01-18 DIAGNOSIS — R600 Localized edema: Secondary | ICD-10-CM | POA: Diagnosis not present

## 2023-01-18 DIAGNOSIS — E559 Vitamin D deficiency, unspecified: Secondary | ICD-10-CM | POA: Diagnosis not present

## 2023-01-18 DIAGNOSIS — L308 Other specified dermatitis: Secondary | ICD-10-CM | POA: Diagnosis not present

## 2023-01-22 DIAGNOSIS — I87321 Chronic venous hypertension (idiopathic) with inflammation of right lower extremity: Secondary | ICD-10-CM | POA: Diagnosis not present

## 2023-01-22 DIAGNOSIS — I482 Chronic atrial fibrillation, unspecified: Secondary | ICD-10-CM | POA: Diagnosis not present

## 2023-01-22 DIAGNOSIS — I890 Lymphedema, not elsewhere classified: Secondary | ICD-10-CM | POA: Diagnosis not present

## 2023-01-22 DIAGNOSIS — I739 Peripheral vascular disease, unspecified: Secondary | ICD-10-CM | POA: Diagnosis not present

## 2023-01-22 DIAGNOSIS — F0390 Unspecified dementia without behavioral disturbance: Secondary | ICD-10-CM | POA: Diagnosis not present

## 2023-01-22 DIAGNOSIS — E875 Hyperkalemia: Secondary | ICD-10-CM | POA: Diagnosis not present

## 2023-01-22 DIAGNOSIS — L97828 Non-pressure chronic ulcer of other part of left lower leg with other specified severity: Secondary | ICD-10-CM | POA: Diagnosis not present

## 2023-01-22 DIAGNOSIS — N179 Acute kidney failure, unspecified: Secondary | ICD-10-CM | POA: Diagnosis not present

## 2023-01-22 DIAGNOSIS — R600 Localized edema: Secondary | ICD-10-CM | POA: Diagnosis not present

## 2023-01-22 DIAGNOSIS — F4321 Adjustment disorder with depressed mood: Secondary | ICD-10-CM | POA: Diagnosis not present

## 2023-01-22 DIAGNOSIS — Z7901 Long term (current) use of anticoagulants: Secondary | ICD-10-CM | POA: Diagnosis not present

## 2023-01-22 DIAGNOSIS — E559 Vitamin D deficiency, unspecified: Secondary | ICD-10-CM | POA: Diagnosis not present

## 2023-01-22 DIAGNOSIS — E782 Mixed hyperlipidemia: Secondary | ICD-10-CM | POA: Diagnosis not present

## 2023-01-22 DIAGNOSIS — L308 Other specified dermatitis: Secondary | ICD-10-CM | POA: Diagnosis not present

## 2023-01-22 DIAGNOSIS — I87332 Chronic venous hypertension (idiopathic) with ulcer and inflammation of left lower extremity: Secondary | ICD-10-CM | POA: Diagnosis not present

## 2023-01-25 DIAGNOSIS — Z7901 Long term (current) use of anticoagulants: Secondary | ICD-10-CM | POA: Diagnosis not present

## 2023-01-25 DIAGNOSIS — I87332 Chronic venous hypertension (idiopathic) with ulcer and inflammation of left lower extremity: Secondary | ICD-10-CM | POA: Diagnosis not present

## 2023-01-25 DIAGNOSIS — I482 Chronic atrial fibrillation, unspecified: Secondary | ICD-10-CM | POA: Diagnosis not present

## 2023-01-25 DIAGNOSIS — I739 Peripheral vascular disease, unspecified: Secondary | ICD-10-CM | POA: Diagnosis not present

## 2023-01-25 DIAGNOSIS — L97828 Non-pressure chronic ulcer of other part of left lower leg with other specified severity: Secondary | ICD-10-CM | POA: Diagnosis not present

## 2023-01-25 DIAGNOSIS — I890 Lymphedema, not elsewhere classified: Secondary | ICD-10-CM | POA: Diagnosis not present

## 2023-01-25 DIAGNOSIS — F0390 Unspecified dementia without behavioral disturbance: Secondary | ICD-10-CM | POA: Diagnosis not present

## 2023-01-25 DIAGNOSIS — N179 Acute kidney failure, unspecified: Secondary | ICD-10-CM | POA: Diagnosis not present

## 2023-01-25 DIAGNOSIS — L308 Other specified dermatitis: Secondary | ICD-10-CM | POA: Diagnosis not present

## 2023-01-25 DIAGNOSIS — E875 Hyperkalemia: Secondary | ICD-10-CM | POA: Diagnosis not present

## 2023-01-25 DIAGNOSIS — R600 Localized edema: Secondary | ICD-10-CM | POA: Diagnosis not present

## 2023-01-25 DIAGNOSIS — E782 Mixed hyperlipidemia: Secondary | ICD-10-CM | POA: Diagnosis not present

## 2023-01-25 DIAGNOSIS — I87321 Chronic venous hypertension (idiopathic) with inflammation of right lower extremity: Secondary | ICD-10-CM | POA: Diagnosis not present

## 2023-01-25 DIAGNOSIS — F4321 Adjustment disorder with depressed mood: Secondary | ICD-10-CM | POA: Diagnosis not present

## 2023-01-25 DIAGNOSIS — E559 Vitamin D deficiency, unspecified: Secondary | ICD-10-CM | POA: Diagnosis not present

## 2023-01-29 DIAGNOSIS — I482 Chronic atrial fibrillation, unspecified: Secondary | ICD-10-CM | POA: Diagnosis not present

## 2023-01-29 DIAGNOSIS — R600 Localized edema: Secondary | ICD-10-CM | POA: Diagnosis not present

## 2023-01-29 DIAGNOSIS — I739 Peripheral vascular disease, unspecified: Secondary | ICD-10-CM | POA: Diagnosis not present

## 2023-01-29 DIAGNOSIS — E782 Mixed hyperlipidemia: Secondary | ICD-10-CM | POA: Diagnosis not present

## 2023-01-29 DIAGNOSIS — L97828 Non-pressure chronic ulcer of other part of left lower leg with other specified severity: Secondary | ICD-10-CM | POA: Diagnosis not present

## 2023-01-29 DIAGNOSIS — I890 Lymphedema, not elsewhere classified: Secondary | ICD-10-CM | POA: Diagnosis not present

## 2023-01-29 DIAGNOSIS — Z7901 Long term (current) use of anticoagulants: Secondary | ICD-10-CM | POA: Diagnosis not present

## 2023-01-29 DIAGNOSIS — E875 Hyperkalemia: Secondary | ICD-10-CM | POA: Diagnosis not present

## 2023-01-29 DIAGNOSIS — I87332 Chronic venous hypertension (idiopathic) with ulcer and inflammation of left lower extremity: Secondary | ICD-10-CM | POA: Diagnosis not present

## 2023-01-29 DIAGNOSIS — F0390 Unspecified dementia without behavioral disturbance: Secondary | ICD-10-CM | POA: Diagnosis not present

## 2023-01-29 DIAGNOSIS — F4321 Adjustment disorder with depressed mood: Secondary | ICD-10-CM | POA: Diagnosis not present

## 2023-01-29 DIAGNOSIS — L308 Other specified dermatitis: Secondary | ICD-10-CM | POA: Diagnosis not present

## 2023-01-29 DIAGNOSIS — I87321 Chronic venous hypertension (idiopathic) with inflammation of right lower extremity: Secondary | ICD-10-CM | POA: Diagnosis not present

## 2023-01-29 DIAGNOSIS — E559 Vitamin D deficiency, unspecified: Secondary | ICD-10-CM | POA: Diagnosis not present

## 2023-01-29 DIAGNOSIS — N179 Acute kidney failure, unspecified: Secondary | ICD-10-CM | POA: Diagnosis not present

## 2023-01-30 DIAGNOSIS — G309 Alzheimer's disease, unspecified: Secondary | ICD-10-CM | POA: Diagnosis not present

## 2023-01-30 DIAGNOSIS — G8929 Other chronic pain: Secondary | ICD-10-CM | POA: Diagnosis not present

## 2023-01-30 DIAGNOSIS — F331 Major depressive disorder, recurrent, moderate: Secondary | ICD-10-CM | POA: Diagnosis not present

## 2023-01-30 DIAGNOSIS — F411 Generalized anxiety disorder: Secondary | ICD-10-CM | POA: Diagnosis not present

## 2023-02-01 DIAGNOSIS — E782 Mixed hyperlipidemia: Secondary | ICD-10-CM | POA: Diagnosis not present

## 2023-02-01 DIAGNOSIS — I890 Lymphedema, not elsewhere classified: Secondary | ICD-10-CM | POA: Diagnosis not present

## 2023-02-01 DIAGNOSIS — Z7901 Long term (current) use of anticoagulants: Secondary | ICD-10-CM | POA: Diagnosis not present

## 2023-02-01 DIAGNOSIS — E875 Hyperkalemia: Secondary | ICD-10-CM | POA: Diagnosis not present

## 2023-02-01 DIAGNOSIS — I87321 Chronic venous hypertension (idiopathic) with inflammation of right lower extremity: Secondary | ICD-10-CM | POA: Diagnosis not present

## 2023-02-01 DIAGNOSIS — I739 Peripheral vascular disease, unspecified: Secondary | ICD-10-CM | POA: Diagnosis not present

## 2023-02-01 DIAGNOSIS — E559 Vitamin D deficiency, unspecified: Secondary | ICD-10-CM | POA: Diagnosis not present

## 2023-02-01 DIAGNOSIS — F0390 Unspecified dementia without behavioral disturbance: Secondary | ICD-10-CM | POA: Diagnosis not present

## 2023-02-01 DIAGNOSIS — I87332 Chronic venous hypertension (idiopathic) with ulcer and inflammation of left lower extremity: Secondary | ICD-10-CM | POA: Diagnosis not present

## 2023-02-01 DIAGNOSIS — L308 Other specified dermatitis: Secondary | ICD-10-CM | POA: Diagnosis not present

## 2023-02-01 DIAGNOSIS — L97828 Non-pressure chronic ulcer of other part of left lower leg with other specified severity: Secondary | ICD-10-CM | POA: Diagnosis not present

## 2023-02-01 DIAGNOSIS — R600 Localized edema: Secondary | ICD-10-CM | POA: Diagnosis not present

## 2023-02-01 DIAGNOSIS — N179 Acute kidney failure, unspecified: Secondary | ICD-10-CM | POA: Diagnosis not present

## 2023-02-01 DIAGNOSIS — I482 Chronic atrial fibrillation, unspecified: Secondary | ICD-10-CM | POA: Diagnosis not present

## 2023-02-01 DIAGNOSIS — F4321 Adjustment disorder with depressed mood: Secondary | ICD-10-CM | POA: Diagnosis not present

## 2023-02-05 DIAGNOSIS — I87321 Chronic venous hypertension (idiopathic) with inflammation of right lower extremity: Secondary | ICD-10-CM | POA: Diagnosis not present

## 2023-02-05 DIAGNOSIS — F4321 Adjustment disorder with depressed mood: Secondary | ICD-10-CM | POA: Diagnosis not present

## 2023-02-05 DIAGNOSIS — E559 Vitamin D deficiency, unspecified: Secondary | ICD-10-CM | POA: Diagnosis not present

## 2023-02-05 DIAGNOSIS — I890 Lymphedema, not elsewhere classified: Secondary | ICD-10-CM | POA: Diagnosis not present

## 2023-02-05 DIAGNOSIS — I739 Peripheral vascular disease, unspecified: Secondary | ICD-10-CM | POA: Diagnosis not present

## 2023-02-05 DIAGNOSIS — N179 Acute kidney failure, unspecified: Secondary | ICD-10-CM | POA: Diagnosis not present

## 2023-02-05 DIAGNOSIS — F0390 Unspecified dementia without behavioral disturbance: Secondary | ICD-10-CM | POA: Diagnosis not present

## 2023-02-05 DIAGNOSIS — E875 Hyperkalemia: Secondary | ICD-10-CM | POA: Diagnosis not present

## 2023-02-05 DIAGNOSIS — L308 Other specified dermatitis: Secondary | ICD-10-CM | POA: Diagnosis not present

## 2023-02-05 DIAGNOSIS — I482 Chronic atrial fibrillation, unspecified: Secondary | ICD-10-CM | POA: Diagnosis not present

## 2023-02-05 DIAGNOSIS — L97828 Non-pressure chronic ulcer of other part of left lower leg with other specified severity: Secondary | ICD-10-CM | POA: Diagnosis not present

## 2023-02-05 DIAGNOSIS — I87332 Chronic venous hypertension (idiopathic) with ulcer and inflammation of left lower extremity: Secondary | ICD-10-CM | POA: Diagnosis not present

## 2023-02-05 DIAGNOSIS — E782 Mixed hyperlipidemia: Secondary | ICD-10-CM | POA: Diagnosis not present

## 2023-02-05 DIAGNOSIS — Z7901 Long term (current) use of anticoagulants: Secondary | ICD-10-CM | POA: Diagnosis not present

## 2023-02-05 DIAGNOSIS — R600 Localized edema: Secondary | ICD-10-CM | POA: Diagnosis not present

## 2023-02-06 DIAGNOSIS — R600 Localized edema: Secondary | ICD-10-CM | POA: Diagnosis not present

## 2023-02-07 DIAGNOSIS — R600 Localized edema: Secondary | ICD-10-CM | POA: Diagnosis not present

## 2023-02-07 DIAGNOSIS — I482 Chronic atrial fibrillation, unspecified: Secondary | ICD-10-CM | POA: Diagnosis not present

## 2023-02-07 DIAGNOSIS — I872 Venous insufficiency (chronic) (peripheral): Secondary | ICD-10-CM | POA: Diagnosis not present

## 2023-02-07 DIAGNOSIS — E782 Mixed hyperlipidemia: Secondary | ICD-10-CM | POA: Diagnosis not present

## 2023-02-08 DIAGNOSIS — R600 Localized edema: Secondary | ICD-10-CM | POA: Diagnosis not present

## 2023-02-08 DIAGNOSIS — E559 Vitamin D deficiency, unspecified: Secondary | ICD-10-CM | POA: Diagnosis not present

## 2023-02-08 DIAGNOSIS — Z7901 Long term (current) use of anticoagulants: Secondary | ICD-10-CM | POA: Diagnosis not present

## 2023-02-08 DIAGNOSIS — N179 Acute kidney failure, unspecified: Secondary | ICD-10-CM | POA: Diagnosis not present

## 2023-02-08 DIAGNOSIS — I890 Lymphedema, not elsewhere classified: Secondary | ICD-10-CM | POA: Diagnosis not present

## 2023-02-08 DIAGNOSIS — F0390 Unspecified dementia without behavioral disturbance: Secondary | ICD-10-CM | POA: Diagnosis not present

## 2023-02-08 DIAGNOSIS — I87332 Chronic venous hypertension (idiopathic) with ulcer and inflammation of left lower extremity: Secondary | ICD-10-CM | POA: Diagnosis not present

## 2023-02-08 DIAGNOSIS — F4321 Adjustment disorder with depressed mood: Secondary | ICD-10-CM | POA: Diagnosis not present

## 2023-02-08 DIAGNOSIS — E875 Hyperkalemia: Secondary | ICD-10-CM | POA: Diagnosis not present

## 2023-02-08 DIAGNOSIS — E782 Mixed hyperlipidemia: Secondary | ICD-10-CM | POA: Diagnosis not present

## 2023-02-08 DIAGNOSIS — L308 Other specified dermatitis: Secondary | ICD-10-CM | POA: Diagnosis not present

## 2023-02-08 DIAGNOSIS — L97828 Non-pressure chronic ulcer of other part of left lower leg with other specified severity: Secondary | ICD-10-CM | POA: Diagnosis not present

## 2023-02-08 DIAGNOSIS — I739 Peripheral vascular disease, unspecified: Secondary | ICD-10-CM | POA: Diagnosis not present

## 2023-02-08 DIAGNOSIS — I87321 Chronic venous hypertension (idiopathic) with inflammation of right lower extremity: Secondary | ICD-10-CM | POA: Diagnosis not present

## 2023-02-08 DIAGNOSIS — I482 Chronic atrial fibrillation, unspecified: Secondary | ICD-10-CM | POA: Diagnosis not present

## 2023-02-12 DIAGNOSIS — R600 Localized edema: Secondary | ICD-10-CM | POA: Diagnosis not present

## 2023-02-12 DIAGNOSIS — F4321 Adjustment disorder with depressed mood: Secondary | ICD-10-CM | POA: Diagnosis not present

## 2023-02-12 DIAGNOSIS — N179 Acute kidney failure, unspecified: Secondary | ICD-10-CM | POA: Diagnosis not present

## 2023-02-12 DIAGNOSIS — E875 Hyperkalemia: Secondary | ICD-10-CM | POA: Diagnosis not present

## 2023-02-12 DIAGNOSIS — L97828 Non-pressure chronic ulcer of other part of left lower leg with other specified severity: Secondary | ICD-10-CM | POA: Diagnosis not present

## 2023-02-12 DIAGNOSIS — E782 Mixed hyperlipidemia: Secondary | ICD-10-CM | POA: Diagnosis not present

## 2023-02-12 DIAGNOSIS — I87332 Chronic venous hypertension (idiopathic) with ulcer and inflammation of left lower extremity: Secondary | ICD-10-CM | POA: Diagnosis not present

## 2023-02-12 DIAGNOSIS — Z7901 Long term (current) use of anticoagulants: Secondary | ICD-10-CM | POA: Diagnosis not present

## 2023-02-12 DIAGNOSIS — F0390 Unspecified dementia without behavioral disturbance: Secondary | ICD-10-CM | POA: Diagnosis not present

## 2023-02-12 DIAGNOSIS — I482 Chronic atrial fibrillation, unspecified: Secondary | ICD-10-CM | POA: Diagnosis not present

## 2023-02-12 DIAGNOSIS — I87321 Chronic venous hypertension (idiopathic) with inflammation of right lower extremity: Secondary | ICD-10-CM | POA: Diagnosis not present

## 2023-02-12 DIAGNOSIS — I739 Peripheral vascular disease, unspecified: Secondary | ICD-10-CM | POA: Diagnosis not present

## 2023-02-12 DIAGNOSIS — L308 Other specified dermatitis: Secondary | ICD-10-CM | POA: Diagnosis not present

## 2023-02-12 DIAGNOSIS — I890 Lymphedema, not elsewhere classified: Secondary | ICD-10-CM | POA: Diagnosis not present

## 2023-02-12 DIAGNOSIS — E559 Vitamin D deficiency, unspecified: Secondary | ICD-10-CM | POA: Diagnosis not present

## 2023-02-15 DIAGNOSIS — L308 Other specified dermatitis: Secondary | ICD-10-CM | POA: Diagnosis not present

## 2023-02-15 DIAGNOSIS — I482 Chronic atrial fibrillation, unspecified: Secondary | ICD-10-CM | POA: Diagnosis not present

## 2023-02-15 DIAGNOSIS — I890 Lymphedema, not elsewhere classified: Secondary | ICD-10-CM | POA: Diagnosis not present

## 2023-02-15 DIAGNOSIS — I87332 Chronic venous hypertension (idiopathic) with ulcer and inflammation of left lower extremity: Secondary | ICD-10-CM | POA: Diagnosis not present

## 2023-02-15 DIAGNOSIS — I739 Peripheral vascular disease, unspecified: Secondary | ICD-10-CM | POA: Diagnosis not present

## 2023-02-15 DIAGNOSIS — E782 Mixed hyperlipidemia: Secondary | ICD-10-CM | POA: Diagnosis not present

## 2023-02-15 DIAGNOSIS — I87321 Chronic venous hypertension (idiopathic) with inflammation of right lower extremity: Secondary | ICD-10-CM | POA: Diagnosis not present

## 2023-02-15 DIAGNOSIS — N179 Acute kidney failure, unspecified: Secondary | ICD-10-CM | POA: Diagnosis not present

## 2023-02-15 DIAGNOSIS — R600 Localized edema: Secondary | ICD-10-CM | POA: Diagnosis not present

## 2023-02-15 DIAGNOSIS — L97828 Non-pressure chronic ulcer of other part of left lower leg with other specified severity: Secondary | ICD-10-CM | POA: Diagnosis not present

## 2023-02-15 DIAGNOSIS — E559 Vitamin D deficiency, unspecified: Secondary | ICD-10-CM | POA: Diagnosis not present

## 2023-02-15 DIAGNOSIS — E875 Hyperkalemia: Secondary | ICD-10-CM | POA: Diagnosis not present

## 2023-02-15 DIAGNOSIS — Z7901 Long term (current) use of anticoagulants: Secondary | ICD-10-CM | POA: Diagnosis not present

## 2023-02-15 DIAGNOSIS — F0390 Unspecified dementia without behavioral disturbance: Secondary | ICD-10-CM | POA: Diagnosis not present

## 2023-02-15 DIAGNOSIS — F4321 Adjustment disorder with depressed mood: Secondary | ICD-10-CM | POA: Diagnosis not present

## 2023-02-17 DIAGNOSIS — E782 Mixed hyperlipidemia: Secondary | ICD-10-CM | POA: Diagnosis not present

## 2023-02-17 DIAGNOSIS — E559 Vitamin D deficiency, unspecified: Secondary | ICD-10-CM | POA: Diagnosis not present

## 2023-02-17 DIAGNOSIS — L308 Other specified dermatitis: Secondary | ICD-10-CM | POA: Diagnosis not present

## 2023-02-17 DIAGNOSIS — Z7901 Long term (current) use of anticoagulants: Secondary | ICD-10-CM | POA: Diagnosis not present

## 2023-02-17 DIAGNOSIS — I739 Peripheral vascular disease, unspecified: Secondary | ICD-10-CM | POA: Diagnosis not present

## 2023-02-17 DIAGNOSIS — E875 Hyperkalemia: Secondary | ICD-10-CM | POA: Diagnosis not present

## 2023-02-17 DIAGNOSIS — F0390 Unspecified dementia without behavioral disturbance: Secondary | ICD-10-CM | POA: Diagnosis not present

## 2023-02-17 DIAGNOSIS — I87321 Chronic venous hypertension (idiopathic) with inflammation of right lower extremity: Secondary | ICD-10-CM | POA: Diagnosis not present

## 2023-02-17 DIAGNOSIS — I87332 Chronic venous hypertension (idiopathic) with ulcer and inflammation of left lower extremity: Secondary | ICD-10-CM | POA: Diagnosis not present

## 2023-02-17 DIAGNOSIS — L97828 Non-pressure chronic ulcer of other part of left lower leg with other specified severity: Secondary | ICD-10-CM | POA: Diagnosis not present

## 2023-02-17 DIAGNOSIS — R600 Localized edema: Secondary | ICD-10-CM | POA: Diagnosis not present

## 2023-02-17 DIAGNOSIS — I482 Chronic atrial fibrillation, unspecified: Secondary | ICD-10-CM | POA: Diagnosis not present

## 2023-02-17 DIAGNOSIS — N179 Acute kidney failure, unspecified: Secondary | ICD-10-CM | POA: Diagnosis not present

## 2023-02-17 DIAGNOSIS — I890 Lymphedema, not elsewhere classified: Secondary | ICD-10-CM | POA: Diagnosis not present

## 2023-02-17 DIAGNOSIS — F4321 Adjustment disorder with depressed mood: Secondary | ICD-10-CM | POA: Diagnosis not present

## 2023-02-19 DIAGNOSIS — I739 Peripheral vascular disease, unspecified: Secondary | ICD-10-CM | POA: Diagnosis not present

## 2023-02-19 DIAGNOSIS — L308 Other specified dermatitis: Secondary | ICD-10-CM | POA: Diagnosis not present

## 2023-02-19 DIAGNOSIS — E559 Vitamin D deficiency, unspecified: Secondary | ICD-10-CM | POA: Diagnosis not present

## 2023-02-19 DIAGNOSIS — E782 Mixed hyperlipidemia: Secondary | ICD-10-CM | POA: Diagnosis not present

## 2023-02-19 DIAGNOSIS — I87332 Chronic venous hypertension (idiopathic) with ulcer and inflammation of left lower extremity: Secondary | ICD-10-CM | POA: Diagnosis not present

## 2023-02-19 DIAGNOSIS — I890 Lymphedema, not elsewhere classified: Secondary | ICD-10-CM | POA: Diagnosis not present

## 2023-02-19 DIAGNOSIS — Z7901 Long term (current) use of anticoagulants: Secondary | ICD-10-CM | POA: Diagnosis not present

## 2023-02-19 DIAGNOSIS — R600 Localized edema: Secondary | ICD-10-CM | POA: Diagnosis not present

## 2023-02-19 DIAGNOSIS — I87321 Chronic venous hypertension (idiopathic) with inflammation of right lower extremity: Secondary | ICD-10-CM | POA: Diagnosis not present

## 2023-02-19 DIAGNOSIS — F0390 Unspecified dementia without behavioral disturbance: Secondary | ICD-10-CM | POA: Diagnosis not present

## 2023-02-19 DIAGNOSIS — L97828 Non-pressure chronic ulcer of other part of left lower leg with other specified severity: Secondary | ICD-10-CM | POA: Diagnosis not present

## 2023-02-19 DIAGNOSIS — E875 Hyperkalemia: Secondary | ICD-10-CM | POA: Diagnosis not present

## 2023-02-19 DIAGNOSIS — F4321 Adjustment disorder with depressed mood: Secondary | ICD-10-CM | POA: Diagnosis not present

## 2023-02-19 DIAGNOSIS — N179 Acute kidney failure, unspecified: Secondary | ICD-10-CM | POA: Diagnosis not present

## 2023-02-19 DIAGNOSIS — I482 Chronic atrial fibrillation, unspecified: Secondary | ICD-10-CM | POA: Diagnosis not present

## 2023-02-22 DIAGNOSIS — F4321 Adjustment disorder with depressed mood: Secondary | ICD-10-CM | POA: Diagnosis not present

## 2023-02-22 DIAGNOSIS — I890 Lymphedema, not elsewhere classified: Secondary | ICD-10-CM | POA: Diagnosis not present

## 2023-02-22 DIAGNOSIS — R600 Localized edema: Secondary | ICD-10-CM | POA: Diagnosis not present

## 2023-02-22 DIAGNOSIS — I87321 Chronic venous hypertension (idiopathic) with inflammation of right lower extremity: Secondary | ICD-10-CM | POA: Diagnosis not present

## 2023-02-22 DIAGNOSIS — E782 Mixed hyperlipidemia: Secondary | ICD-10-CM | POA: Diagnosis not present

## 2023-02-22 DIAGNOSIS — E875 Hyperkalemia: Secondary | ICD-10-CM | POA: Diagnosis not present

## 2023-02-22 DIAGNOSIS — I739 Peripheral vascular disease, unspecified: Secondary | ICD-10-CM | POA: Diagnosis not present

## 2023-02-22 DIAGNOSIS — L97828 Non-pressure chronic ulcer of other part of left lower leg with other specified severity: Secondary | ICD-10-CM | POA: Diagnosis not present

## 2023-02-22 DIAGNOSIS — I482 Chronic atrial fibrillation, unspecified: Secondary | ICD-10-CM | POA: Diagnosis not present

## 2023-02-22 DIAGNOSIS — F0390 Unspecified dementia without behavioral disturbance: Secondary | ICD-10-CM | POA: Diagnosis not present

## 2023-02-22 DIAGNOSIS — E559 Vitamin D deficiency, unspecified: Secondary | ICD-10-CM | POA: Diagnosis not present

## 2023-02-22 DIAGNOSIS — Z7901 Long term (current) use of anticoagulants: Secondary | ICD-10-CM | POA: Diagnosis not present

## 2023-02-22 DIAGNOSIS — L308 Other specified dermatitis: Secondary | ICD-10-CM | POA: Diagnosis not present

## 2023-02-22 DIAGNOSIS — N179 Acute kidney failure, unspecified: Secondary | ICD-10-CM | POA: Diagnosis not present

## 2023-02-22 DIAGNOSIS — I87332 Chronic venous hypertension (idiopathic) with ulcer and inflammation of left lower extremity: Secondary | ICD-10-CM | POA: Diagnosis not present

## 2023-02-27 DIAGNOSIS — I87321 Chronic venous hypertension (idiopathic) with inflammation of right lower extremity: Secondary | ICD-10-CM | POA: Diagnosis not present

## 2023-02-27 DIAGNOSIS — I87332 Chronic venous hypertension (idiopathic) with ulcer and inflammation of left lower extremity: Secondary | ICD-10-CM | POA: Diagnosis not present

## 2023-02-27 DIAGNOSIS — I890 Lymphedema, not elsewhere classified: Secondary | ICD-10-CM | POA: Diagnosis not present

## 2023-02-27 DIAGNOSIS — R600 Localized edema: Secondary | ICD-10-CM | POA: Diagnosis not present

## 2023-02-27 DIAGNOSIS — F419 Anxiety disorder, unspecified: Secondary | ICD-10-CM | POA: Diagnosis not present

## 2023-02-27 DIAGNOSIS — E782 Mixed hyperlipidemia: Secondary | ICD-10-CM | POA: Diagnosis not present

## 2023-02-27 DIAGNOSIS — I739 Peripheral vascular disease, unspecified: Secondary | ICD-10-CM | POA: Diagnosis not present

## 2023-02-27 DIAGNOSIS — E559 Vitamin D deficiency, unspecified: Secondary | ICD-10-CM | POA: Diagnosis not present

## 2023-02-27 DIAGNOSIS — F0390 Unspecified dementia without behavioral disturbance: Secondary | ICD-10-CM | POA: Diagnosis not present

## 2023-02-27 DIAGNOSIS — N179 Acute kidney failure, unspecified: Secondary | ICD-10-CM | POA: Diagnosis not present

## 2023-02-27 DIAGNOSIS — F329 Major depressive disorder, single episode, unspecified: Secondary | ICD-10-CM | POA: Diagnosis not present

## 2023-02-27 DIAGNOSIS — E875 Hyperkalemia: Secondary | ICD-10-CM | POA: Diagnosis not present

## 2023-02-27 DIAGNOSIS — L308 Other specified dermatitis: Secondary | ICD-10-CM | POA: Diagnosis not present

## 2023-02-27 DIAGNOSIS — F4321 Adjustment disorder with depressed mood: Secondary | ICD-10-CM | POA: Diagnosis not present

## 2023-02-27 DIAGNOSIS — I482 Chronic atrial fibrillation, unspecified: Secondary | ICD-10-CM | POA: Diagnosis not present

## 2023-02-27 DIAGNOSIS — L97828 Non-pressure chronic ulcer of other part of left lower leg with other specified severity: Secondary | ICD-10-CM | POA: Diagnosis not present

## 2023-02-27 DIAGNOSIS — Z7901 Long term (current) use of anticoagulants: Secondary | ICD-10-CM | POA: Diagnosis not present

## 2023-03-05 DIAGNOSIS — N179 Acute kidney failure, unspecified: Secondary | ICD-10-CM | POA: Diagnosis not present

## 2023-03-05 DIAGNOSIS — F0390 Unspecified dementia without behavioral disturbance: Secondary | ICD-10-CM | POA: Diagnosis not present

## 2023-03-05 DIAGNOSIS — E559 Vitamin D deficiency, unspecified: Secondary | ICD-10-CM | POA: Diagnosis not present

## 2023-03-05 DIAGNOSIS — F4321 Adjustment disorder with depressed mood: Secondary | ICD-10-CM | POA: Diagnosis not present

## 2023-03-05 DIAGNOSIS — Z7901 Long term (current) use of anticoagulants: Secondary | ICD-10-CM | POA: Diagnosis not present

## 2023-03-05 DIAGNOSIS — R600 Localized edema: Secondary | ICD-10-CM | POA: Diagnosis not present

## 2023-03-05 DIAGNOSIS — I87321 Chronic venous hypertension (idiopathic) with inflammation of right lower extremity: Secondary | ICD-10-CM | POA: Diagnosis not present

## 2023-03-05 DIAGNOSIS — E782 Mixed hyperlipidemia: Secondary | ICD-10-CM | POA: Diagnosis not present

## 2023-03-05 DIAGNOSIS — L97828 Non-pressure chronic ulcer of other part of left lower leg with other specified severity: Secondary | ICD-10-CM | POA: Diagnosis not present

## 2023-03-05 DIAGNOSIS — I739 Peripheral vascular disease, unspecified: Secondary | ICD-10-CM | POA: Diagnosis not present

## 2023-03-05 DIAGNOSIS — I482 Chronic atrial fibrillation, unspecified: Secondary | ICD-10-CM | POA: Diagnosis not present

## 2023-03-05 DIAGNOSIS — I890 Lymphedema, not elsewhere classified: Secondary | ICD-10-CM | POA: Diagnosis not present

## 2023-03-05 DIAGNOSIS — I87332 Chronic venous hypertension (idiopathic) with ulcer and inflammation of left lower extremity: Secondary | ICD-10-CM | POA: Diagnosis not present

## 2023-03-05 DIAGNOSIS — E875 Hyperkalemia: Secondary | ICD-10-CM | POA: Diagnosis not present

## 2023-03-05 DIAGNOSIS — L308 Other specified dermatitis: Secondary | ICD-10-CM | POA: Diagnosis not present

## 2023-03-06 DIAGNOSIS — R600 Localized edema: Secondary | ICD-10-CM | POA: Diagnosis not present

## 2023-03-06 DIAGNOSIS — Z7901 Long term (current) use of anticoagulants: Secondary | ICD-10-CM | POA: Diagnosis not present

## 2023-03-06 DIAGNOSIS — I482 Chronic atrial fibrillation, unspecified: Secondary | ICD-10-CM | POA: Diagnosis not present

## 2023-03-06 DIAGNOSIS — N179 Acute kidney failure, unspecified: Secondary | ICD-10-CM | POA: Diagnosis not present

## 2023-03-06 DIAGNOSIS — I739 Peripheral vascular disease, unspecified: Secondary | ICD-10-CM | POA: Diagnosis not present

## 2023-03-06 DIAGNOSIS — I87332 Chronic venous hypertension (idiopathic) with ulcer and inflammation of left lower extremity: Secondary | ICD-10-CM | POA: Diagnosis not present

## 2023-03-06 DIAGNOSIS — L97828 Non-pressure chronic ulcer of other part of left lower leg with other specified severity: Secondary | ICD-10-CM | POA: Diagnosis not present

## 2023-03-06 DIAGNOSIS — I87321 Chronic venous hypertension (idiopathic) with inflammation of right lower extremity: Secondary | ICD-10-CM | POA: Diagnosis not present

## 2023-03-06 DIAGNOSIS — E559 Vitamin D deficiency, unspecified: Secondary | ICD-10-CM | POA: Diagnosis not present

## 2023-03-06 DIAGNOSIS — E782 Mixed hyperlipidemia: Secondary | ICD-10-CM | POA: Diagnosis not present

## 2023-03-06 DIAGNOSIS — F0390 Unspecified dementia without behavioral disturbance: Secondary | ICD-10-CM | POA: Diagnosis not present

## 2023-03-06 DIAGNOSIS — I890 Lymphedema, not elsewhere classified: Secondary | ICD-10-CM | POA: Diagnosis not present

## 2023-03-06 DIAGNOSIS — L308 Other specified dermatitis: Secondary | ICD-10-CM | POA: Diagnosis not present

## 2023-03-06 DIAGNOSIS — F4321 Adjustment disorder with depressed mood: Secondary | ICD-10-CM | POA: Diagnosis not present

## 2023-03-06 DIAGNOSIS — E875 Hyperkalemia: Secondary | ICD-10-CM | POA: Diagnosis not present

## 2023-03-08 DIAGNOSIS — I87321 Chronic venous hypertension (idiopathic) with inflammation of right lower extremity: Secondary | ICD-10-CM | POA: Diagnosis not present

## 2023-03-08 DIAGNOSIS — I87332 Chronic venous hypertension (idiopathic) with ulcer and inflammation of left lower extremity: Secondary | ICD-10-CM | POA: Diagnosis not present

## 2023-03-08 DIAGNOSIS — L97828 Non-pressure chronic ulcer of other part of left lower leg with other specified severity: Secondary | ICD-10-CM | POA: Diagnosis not present

## 2023-03-14 DIAGNOSIS — F0390 Unspecified dementia without behavioral disturbance: Secondary | ICD-10-CM | POA: Diagnosis not present

## 2023-03-14 DIAGNOSIS — E875 Hyperkalemia: Secondary | ICD-10-CM | POA: Diagnosis not present

## 2023-03-14 DIAGNOSIS — I890 Lymphedema, not elsewhere classified: Secondary | ICD-10-CM | POA: Diagnosis not present

## 2023-03-14 DIAGNOSIS — L97828 Non-pressure chronic ulcer of other part of left lower leg with other specified severity: Secondary | ICD-10-CM | POA: Diagnosis not present

## 2023-03-14 DIAGNOSIS — N179 Acute kidney failure, unspecified: Secondary | ICD-10-CM | POA: Diagnosis not present

## 2023-03-14 DIAGNOSIS — I87321 Chronic venous hypertension (idiopathic) with inflammation of right lower extremity: Secondary | ICD-10-CM | POA: Diagnosis not present

## 2023-03-14 DIAGNOSIS — L308 Other specified dermatitis: Secondary | ICD-10-CM | POA: Diagnosis not present

## 2023-03-14 DIAGNOSIS — I482 Chronic atrial fibrillation, unspecified: Secondary | ICD-10-CM | POA: Diagnosis not present

## 2023-03-14 DIAGNOSIS — I87332 Chronic venous hypertension (idiopathic) with ulcer and inflammation of left lower extremity: Secondary | ICD-10-CM | POA: Diagnosis not present

## 2023-03-14 DIAGNOSIS — E782 Mixed hyperlipidemia: Secondary | ICD-10-CM | POA: Diagnosis not present

## 2023-03-14 DIAGNOSIS — Z7901 Long term (current) use of anticoagulants: Secondary | ICD-10-CM | POA: Diagnosis not present

## 2023-03-14 DIAGNOSIS — E559 Vitamin D deficiency, unspecified: Secondary | ICD-10-CM | POA: Diagnosis not present

## 2023-03-14 DIAGNOSIS — I739 Peripheral vascular disease, unspecified: Secondary | ICD-10-CM | POA: Diagnosis not present

## 2023-03-14 DIAGNOSIS — R600 Localized edema: Secondary | ICD-10-CM | POA: Diagnosis not present

## 2023-03-14 DIAGNOSIS — F4321 Adjustment disorder with depressed mood: Secondary | ICD-10-CM | POA: Diagnosis not present

## 2023-03-15 DIAGNOSIS — F0390 Unspecified dementia without behavioral disturbance: Secondary | ICD-10-CM | POA: Diagnosis not present

## 2023-03-15 DIAGNOSIS — N179 Acute kidney failure, unspecified: Secondary | ICD-10-CM | POA: Diagnosis not present

## 2023-03-15 DIAGNOSIS — I87321 Chronic venous hypertension (idiopathic) with inflammation of right lower extremity: Secondary | ICD-10-CM | POA: Diagnosis not present

## 2023-03-15 DIAGNOSIS — L308 Other specified dermatitis: Secondary | ICD-10-CM | POA: Diagnosis not present

## 2023-03-15 DIAGNOSIS — I87332 Chronic venous hypertension (idiopathic) with ulcer and inflammation of left lower extremity: Secondary | ICD-10-CM | POA: Diagnosis not present

## 2023-03-15 DIAGNOSIS — I482 Chronic atrial fibrillation, unspecified: Secondary | ICD-10-CM | POA: Diagnosis not present

## 2023-03-15 DIAGNOSIS — E875 Hyperkalemia: Secondary | ICD-10-CM | POA: Diagnosis not present

## 2023-03-15 DIAGNOSIS — R600 Localized edema: Secondary | ICD-10-CM | POA: Diagnosis not present

## 2023-03-15 DIAGNOSIS — I890 Lymphedema, not elsewhere classified: Secondary | ICD-10-CM | POA: Diagnosis not present

## 2023-03-15 DIAGNOSIS — L97828 Non-pressure chronic ulcer of other part of left lower leg with other specified severity: Secondary | ICD-10-CM | POA: Diagnosis not present

## 2023-03-15 DIAGNOSIS — F4321 Adjustment disorder with depressed mood: Secondary | ICD-10-CM | POA: Diagnosis not present

## 2023-03-15 DIAGNOSIS — E782 Mixed hyperlipidemia: Secondary | ICD-10-CM | POA: Diagnosis not present

## 2023-03-15 DIAGNOSIS — E559 Vitamin D deficiency, unspecified: Secondary | ICD-10-CM | POA: Diagnosis not present

## 2023-03-15 DIAGNOSIS — I739 Peripheral vascular disease, unspecified: Secondary | ICD-10-CM | POA: Diagnosis not present

## 2023-03-15 DIAGNOSIS — Z7901 Long term (current) use of anticoagulants: Secondary | ICD-10-CM | POA: Diagnosis not present

## 2023-03-19 DIAGNOSIS — F4321 Adjustment disorder with depressed mood: Secondary | ICD-10-CM | POA: Diagnosis not present

## 2023-03-19 DIAGNOSIS — E782 Mixed hyperlipidemia: Secondary | ICD-10-CM | POA: Diagnosis not present

## 2023-03-19 DIAGNOSIS — E559 Vitamin D deficiency, unspecified: Secondary | ICD-10-CM | POA: Diagnosis not present

## 2023-03-19 DIAGNOSIS — I87322 Chronic venous hypertension (idiopathic) with inflammation of left lower extremity: Secondary | ICD-10-CM | POA: Diagnosis not present

## 2023-03-19 DIAGNOSIS — Z7901 Long term (current) use of anticoagulants: Secondary | ICD-10-CM | POA: Diagnosis not present

## 2023-03-19 DIAGNOSIS — L308 Other specified dermatitis: Secondary | ICD-10-CM | POA: Diagnosis not present

## 2023-03-19 DIAGNOSIS — I872 Venous insufficiency (chronic) (peripheral): Secondary | ICD-10-CM | POA: Diagnosis not present

## 2023-03-19 DIAGNOSIS — N179 Acute kidney failure, unspecified: Secondary | ICD-10-CM | POA: Diagnosis not present

## 2023-03-19 DIAGNOSIS — I482 Chronic atrial fibrillation, unspecified: Secondary | ICD-10-CM | POA: Diagnosis not present

## 2023-03-19 DIAGNOSIS — F419 Anxiety disorder, unspecified: Secondary | ICD-10-CM | POA: Diagnosis not present

## 2023-03-19 DIAGNOSIS — F0390 Unspecified dementia without behavioral disturbance: Secondary | ICD-10-CM | POA: Diagnosis not present

## 2023-03-19 DIAGNOSIS — E875 Hyperkalemia: Secondary | ICD-10-CM | POA: Diagnosis not present

## 2023-03-19 DIAGNOSIS — I739 Peripheral vascular disease, unspecified: Secondary | ICD-10-CM | POA: Diagnosis not present

## 2023-03-19 DIAGNOSIS — F329 Major depressive disorder, single episode, unspecified: Secondary | ICD-10-CM | POA: Diagnosis not present

## 2023-03-19 DIAGNOSIS — I87321 Chronic venous hypertension (idiopathic) with inflammation of right lower extremity: Secondary | ICD-10-CM | POA: Diagnosis not present

## 2023-03-19 DIAGNOSIS — I890 Lymphedema, not elsewhere classified: Secondary | ICD-10-CM | POA: Diagnosis not present

## 2023-03-21 DIAGNOSIS — I482 Chronic atrial fibrillation, unspecified: Secondary | ICD-10-CM | POA: Diagnosis not present

## 2023-03-21 DIAGNOSIS — I87321 Chronic venous hypertension (idiopathic) with inflammation of right lower extremity: Secondary | ICD-10-CM | POA: Diagnosis not present

## 2023-03-21 DIAGNOSIS — E782 Mixed hyperlipidemia: Secondary | ICD-10-CM | POA: Diagnosis not present

## 2023-03-21 DIAGNOSIS — F0390 Unspecified dementia without behavioral disturbance: Secondary | ICD-10-CM | POA: Diagnosis not present

## 2023-03-21 DIAGNOSIS — N179 Acute kidney failure, unspecified: Secondary | ICD-10-CM | POA: Diagnosis not present

## 2023-03-21 DIAGNOSIS — I739 Peripheral vascular disease, unspecified: Secondary | ICD-10-CM | POA: Diagnosis not present

## 2023-03-21 DIAGNOSIS — E559 Vitamin D deficiency, unspecified: Secondary | ICD-10-CM | POA: Diagnosis not present

## 2023-03-21 DIAGNOSIS — Z7901 Long term (current) use of anticoagulants: Secondary | ICD-10-CM | POA: Diagnosis not present

## 2023-03-21 DIAGNOSIS — E875 Hyperkalemia: Secondary | ICD-10-CM | POA: Diagnosis not present

## 2023-03-21 DIAGNOSIS — L308 Other specified dermatitis: Secondary | ICD-10-CM | POA: Diagnosis not present

## 2023-03-21 DIAGNOSIS — F4321 Adjustment disorder with depressed mood: Secondary | ICD-10-CM | POA: Diagnosis not present

## 2023-03-21 DIAGNOSIS — I890 Lymphedema, not elsewhere classified: Secondary | ICD-10-CM | POA: Diagnosis not present

## 2023-03-21 DIAGNOSIS — I87322 Chronic venous hypertension (idiopathic) with inflammation of left lower extremity: Secondary | ICD-10-CM | POA: Diagnosis not present

## 2023-03-27 DIAGNOSIS — I482 Chronic atrial fibrillation, unspecified: Secondary | ICD-10-CM | POA: Diagnosis not present

## 2023-03-27 DIAGNOSIS — F0390 Unspecified dementia without behavioral disturbance: Secondary | ICD-10-CM | POA: Diagnosis not present

## 2023-03-27 DIAGNOSIS — N179 Acute kidney failure, unspecified: Secondary | ICD-10-CM | POA: Diagnosis not present

## 2023-03-27 DIAGNOSIS — I890 Lymphedema, not elsewhere classified: Secondary | ICD-10-CM | POA: Diagnosis not present

## 2023-03-27 DIAGNOSIS — L308 Other specified dermatitis: Secondary | ICD-10-CM | POA: Diagnosis not present

## 2023-03-27 DIAGNOSIS — I87321 Chronic venous hypertension (idiopathic) with inflammation of right lower extremity: Secondary | ICD-10-CM | POA: Diagnosis not present

## 2023-03-27 DIAGNOSIS — I739 Peripheral vascular disease, unspecified: Secondary | ICD-10-CM | POA: Diagnosis not present

## 2023-03-27 DIAGNOSIS — I87322 Chronic venous hypertension (idiopathic) with inflammation of left lower extremity: Secondary | ICD-10-CM | POA: Diagnosis not present

## 2023-03-27 DIAGNOSIS — E559 Vitamin D deficiency, unspecified: Secondary | ICD-10-CM | POA: Diagnosis not present

## 2023-03-27 DIAGNOSIS — F4321 Adjustment disorder with depressed mood: Secondary | ICD-10-CM | POA: Diagnosis not present

## 2023-03-27 DIAGNOSIS — E782 Mixed hyperlipidemia: Secondary | ICD-10-CM | POA: Diagnosis not present

## 2023-03-27 DIAGNOSIS — Z7901 Long term (current) use of anticoagulants: Secondary | ICD-10-CM | POA: Diagnosis not present

## 2023-03-27 DIAGNOSIS — E875 Hyperkalemia: Secondary | ICD-10-CM | POA: Diagnosis not present

## 2023-04-04 DIAGNOSIS — E875 Hyperkalemia: Secondary | ICD-10-CM | POA: Diagnosis not present

## 2023-04-04 DIAGNOSIS — I890 Lymphedema, not elsewhere classified: Secondary | ICD-10-CM | POA: Diagnosis not present

## 2023-04-04 DIAGNOSIS — Z7901 Long term (current) use of anticoagulants: Secondary | ICD-10-CM | POA: Diagnosis not present

## 2023-04-04 DIAGNOSIS — E782 Mixed hyperlipidemia: Secondary | ICD-10-CM | POA: Diagnosis not present

## 2023-04-04 DIAGNOSIS — L308 Other specified dermatitis: Secondary | ICD-10-CM | POA: Diagnosis not present

## 2023-04-04 DIAGNOSIS — I87321 Chronic venous hypertension (idiopathic) with inflammation of right lower extremity: Secondary | ICD-10-CM | POA: Diagnosis not present

## 2023-04-04 DIAGNOSIS — I482 Chronic atrial fibrillation, unspecified: Secondary | ICD-10-CM | POA: Diagnosis not present

## 2023-04-04 DIAGNOSIS — E559 Vitamin D deficiency, unspecified: Secondary | ICD-10-CM | POA: Diagnosis not present

## 2023-04-04 DIAGNOSIS — F0390 Unspecified dementia without behavioral disturbance: Secondary | ICD-10-CM | POA: Diagnosis not present

## 2023-04-04 DIAGNOSIS — N179 Acute kidney failure, unspecified: Secondary | ICD-10-CM | POA: Diagnosis not present

## 2023-04-04 DIAGNOSIS — F4321 Adjustment disorder with depressed mood: Secondary | ICD-10-CM | POA: Diagnosis not present

## 2023-04-04 DIAGNOSIS — I739 Peripheral vascular disease, unspecified: Secondary | ICD-10-CM | POA: Diagnosis not present

## 2023-04-04 DIAGNOSIS — I87322 Chronic venous hypertension (idiopathic) with inflammation of left lower extremity: Secondary | ICD-10-CM | POA: Diagnosis not present

## 2023-04-09 DIAGNOSIS — I482 Chronic atrial fibrillation, unspecified: Secondary | ICD-10-CM | POA: Diagnosis not present

## 2023-04-09 DIAGNOSIS — I87321 Chronic venous hypertension (idiopathic) with inflammation of right lower extremity: Secondary | ICD-10-CM | POA: Diagnosis not present

## 2023-04-09 DIAGNOSIS — I872 Venous insufficiency (chronic) (peripheral): Secondary | ICD-10-CM | POA: Diagnosis not present

## 2023-04-09 DIAGNOSIS — E782 Mixed hyperlipidemia: Secondary | ICD-10-CM | POA: Diagnosis not present

## 2023-04-11 DIAGNOSIS — R600 Localized edema: Secondary | ICD-10-CM | POA: Diagnosis not present

## 2023-04-11 DIAGNOSIS — Z7901 Long term (current) use of anticoagulants: Secondary | ICD-10-CM | POA: Diagnosis not present

## 2023-04-11 DIAGNOSIS — E782 Mixed hyperlipidemia: Secondary | ICD-10-CM | POA: Diagnosis not present

## 2023-04-11 DIAGNOSIS — F4321 Adjustment disorder with depressed mood: Secondary | ICD-10-CM | POA: Diagnosis not present

## 2023-04-11 DIAGNOSIS — I87321 Chronic venous hypertension (idiopathic) with inflammation of right lower extremity: Secondary | ICD-10-CM | POA: Diagnosis not present

## 2023-04-11 DIAGNOSIS — I739 Peripheral vascular disease, unspecified: Secondary | ICD-10-CM | POA: Diagnosis not present

## 2023-04-11 DIAGNOSIS — I87322 Chronic venous hypertension (idiopathic) with inflammation of left lower extremity: Secondary | ICD-10-CM | POA: Diagnosis not present

## 2023-04-11 DIAGNOSIS — L97811 Non-pressure chronic ulcer of other part of right lower leg limited to breakdown of skin: Secondary | ICD-10-CM | POA: Diagnosis not present

## 2023-04-11 DIAGNOSIS — I890 Lymphedema, not elsewhere classified: Secondary | ICD-10-CM | POA: Diagnosis not present

## 2023-04-11 DIAGNOSIS — E559 Vitamin D deficiency, unspecified: Secondary | ICD-10-CM | POA: Diagnosis not present

## 2023-04-11 DIAGNOSIS — F0394 Unspecified dementia, unspecified severity, with anxiety: Secondary | ICD-10-CM | POA: Diagnosis not present

## 2023-04-11 DIAGNOSIS — F32A Depression, unspecified: Secondary | ICD-10-CM | POA: Diagnosis not present

## 2023-04-11 DIAGNOSIS — I482 Chronic atrial fibrillation, unspecified: Secondary | ICD-10-CM | POA: Diagnosis not present

## 2023-04-11 DIAGNOSIS — G8929 Other chronic pain: Secondary | ICD-10-CM | POA: Diagnosis not present

## 2023-04-11 DIAGNOSIS — F0393 Unspecified dementia, unspecified severity, with mood disturbance: Secondary | ICD-10-CM | POA: Diagnosis not present

## 2023-04-12 DIAGNOSIS — E559 Vitamin D deficiency, unspecified: Secondary | ICD-10-CM | POA: Diagnosis not present

## 2023-04-12 DIAGNOSIS — E782 Mixed hyperlipidemia: Secondary | ICD-10-CM | POA: Diagnosis not present

## 2023-04-12 DIAGNOSIS — F330 Major depressive disorder, recurrent, mild: Secondary | ICD-10-CM | POA: Diagnosis not present

## 2023-04-12 DIAGNOSIS — I482 Chronic atrial fibrillation, unspecified: Secondary | ICD-10-CM | POA: Diagnosis not present

## 2023-04-12 DIAGNOSIS — N400 Enlarged prostate without lower urinary tract symptoms: Secondary | ICD-10-CM | POA: Diagnosis not present

## 2023-04-13 DIAGNOSIS — I87321 Chronic venous hypertension (idiopathic) with inflammation of right lower extremity: Secondary | ICD-10-CM | POA: Diagnosis not present

## 2023-04-13 DIAGNOSIS — F32A Depression, unspecified: Secondary | ICD-10-CM | POA: Diagnosis not present

## 2023-04-13 DIAGNOSIS — I482 Chronic atrial fibrillation, unspecified: Secondary | ICD-10-CM | POA: Diagnosis not present

## 2023-04-13 DIAGNOSIS — I739 Peripheral vascular disease, unspecified: Secondary | ICD-10-CM | POA: Diagnosis not present

## 2023-04-13 DIAGNOSIS — F4321 Adjustment disorder with depressed mood: Secondary | ICD-10-CM | POA: Diagnosis not present

## 2023-04-13 DIAGNOSIS — E559 Vitamin D deficiency, unspecified: Secondary | ICD-10-CM | POA: Diagnosis not present

## 2023-04-13 DIAGNOSIS — I890 Lymphedema, not elsewhere classified: Secondary | ICD-10-CM | POA: Diagnosis not present

## 2023-04-13 DIAGNOSIS — E782 Mixed hyperlipidemia: Secondary | ICD-10-CM | POA: Diagnosis not present

## 2023-04-13 DIAGNOSIS — G8929 Other chronic pain: Secondary | ICD-10-CM | POA: Diagnosis not present

## 2023-04-13 DIAGNOSIS — Z7901 Long term (current) use of anticoagulants: Secondary | ICD-10-CM | POA: Diagnosis not present

## 2023-04-13 DIAGNOSIS — F0394 Unspecified dementia, unspecified severity, with anxiety: Secondary | ICD-10-CM | POA: Diagnosis not present

## 2023-04-13 DIAGNOSIS — F0393 Unspecified dementia, unspecified severity, with mood disturbance: Secondary | ICD-10-CM | POA: Diagnosis not present

## 2023-04-13 DIAGNOSIS — R600 Localized edema: Secondary | ICD-10-CM | POA: Diagnosis not present

## 2023-04-13 DIAGNOSIS — L97811 Non-pressure chronic ulcer of other part of right lower leg limited to breakdown of skin: Secondary | ICD-10-CM | POA: Diagnosis not present

## 2023-04-17 DIAGNOSIS — Z7901 Long term (current) use of anticoagulants: Secondary | ICD-10-CM | POA: Diagnosis not present

## 2023-04-17 DIAGNOSIS — I482 Chronic atrial fibrillation, unspecified: Secondary | ICD-10-CM | POA: Diagnosis not present

## 2023-04-17 DIAGNOSIS — F32A Depression, unspecified: Secondary | ICD-10-CM | POA: Diagnosis not present

## 2023-04-17 DIAGNOSIS — F0393 Unspecified dementia, unspecified severity, with mood disturbance: Secondary | ICD-10-CM | POA: Diagnosis not present

## 2023-04-17 DIAGNOSIS — E782 Mixed hyperlipidemia: Secondary | ICD-10-CM | POA: Diagnosis not present

## 2023-04-17 DIAGNOSIS — I739 Peripheral vascular disease, unspecified: Secondary | ICD-10-CM | POA: Diagnosis not present

## 2023-04-17 DIAGNOSIS — F0394 Unspecified dementia, unspecified severity, with anxiety: Secondary | ICD-10-CM | POA: Diagnosis not present

## 2023-04-17 DIAGNOSIS — L97811 Non-pressure chronic ulcer of other part of right lower leg limited to breakdown of skin: Secondary | ICD-10-CM | POA: Diagnosis not present

## 2023-04-17 DIAGNOSIS — G8929 Other chronic pain: Secondary | ICD-10-CM | POA: Diagnosis not present

## 2023-04-17 DIAGNOSIS — F4321 Adjustment disorder with depressed mood: Secondary | ICD-10-CM | POA: Diagnosis not present

## 2023-04-17 DIAGNOSIS — E559 Vitamin D deficiency, unspecified: Secondary | ICD-10-CM | POA: Diagnosis not present

## 2023-04-17 DIAGNOSIS — R600 Localized edema: Secondary | ICD-10-CM | POA: Diagnosis not present

## 2023-04-17 DIAGNOSIS — I87321 Chronic venous hypertension (idiopathic) with inflammation of right lower extremity: Secondary | ICD-10-CM | POA: Diagnosis not present

## 2023-04-17 DIAGNOSIS — I890 Lymphedema, not elsewhere classified: Secondary | ICD-10-CM | POA: Diagnosis not present

## 2023-04-20 DIAGNOSIS — I890 Lymphedema, not elsewhere classified: Secondary | ICD-10-CM | POA: Diagnosis not present

## 2023-04-20 DIAGNOSIS — I872 Venous insufficiency (chronic) (peripheral): Secondary | ICD-10-CM | POA: Diagnosis not present

## 2023-04-20 DIAGNOSIS — E782 Mixed hyperlipidemia: Secondary | ICD-10-CM | POA: Diagnosis not present

## 2023-04-20 DIAGNOSIS — L97811 Non-pressure chronic ulcer of other part of right lower leg limited to breakdown of skin: Secondary | ICD-10-CM | POA: Diagnosis not present

## 2023-04-20 DIAGNOSIS — R600 Localized edema: Secondary | ICD-10-CM | POA: Diagnosis not present

## 2023-04-20 DIAGNOSIS — I739 Peripheral vascular disease, unspecified: Secondary | ICD-10-CM | POA: Diagnosis not present

## 2023-04-20 DIAGNOSIS — F0393 Unspecified dementia, unspecified severity, with mood disturbance: Secondary | ICD-10-CM | POA: Diagnosis not present

## 2023-04-20 DIAGNOSIS — F0394 Unspecified dementia, unspecified severity, with anxiety: Secondary | ICD-10-CM | POA: Diagnosis not present

## 2023-04-20 DIAGNOSIS — F4321 Adjustment disorder with depressed mood: Secondary | ICD-10-CM | POA: Diagnosis not present

## 2023-04-20 DIAGNOSIS — E559 Vitamin D deficiency, unspecified: Secondary | ICD-10-CM | POA: Diagnosis not present

## 2023-04-20 DIAGNOSIS — Z7901 Long term (current) use of anticoagulants: Secondary | ICD-10-CM | POA: Diagnosis not present

## 2023-04-20 DIAGNOSIS — I87321 Chronic venous hypertension (idiopathic) with inflammation of right lower extremity: Secondary | ICD-10-CM | POA: Diagnosis not present

## 2023-04-20 DIAGNOSIS — I482 Chronic atrial fibrillation, unspecified: Secondary | ICD-10-CM | POA: Diagnosis not present

## 2023-04-20 DIAGNOSIS — F32A Depression, unspecified: Secondary | ICD-10-CM | POA: Diagnosis not present

## 2023-04-20 DIAGNOSIS — G8929 Other chronic pain: Secondary | ICD-10-CM | POA: Diagnosis not present

## 2023-04-23 DIAGNOSIS — R600 Localized edema: Secondary | ICD-10-CM | POA: Diagnosis not present

## 2023-04-23 DIAGNOSIS — F0394 Unspecified dementia, unspecified severity, with anxiety: Secondary | ICD-10-CM | POA: Diagnosis not present

## 2023-04-23 DIAGNOSIS — F419 Anxiety disorder, unspecified: Secondary | ICD-10-CM | POA: Diagnosis not present

## 2023-04-23 DIAGNOSIS — I482 Chronic atrial fibrillation, unspecified: Secondary | ICD-10-CM | POA: Diagnosis not present

## 2023-04-23 DIAGNOSIS — E559 Vitamin D deficiency, unspecified: Secondary | ICD-10-CM | POA: Diagnosis not present

## 2023-04-23 DIAGNOSIS — I87321 Chronic venous hypertension (idiopathic) with inflammation of right lower extremity: Secondary | ICD-10-CM | POA: Diagnosis not present

## 2023-04-23 DIAGNOSIS — F0393 Unspecified dementia, unspecified severity, with mood disturbance: Secondary | ICD-10-CM | POA: Diagnosis not present

## 2023-04-23 DIAGNOSIS — Z7901 Long term (current) use of anticoagulants: Secondary | ICD-10-CM | POA: Diagnosis not present

## 2023-04-23 DIAGNOSIS — I739 Peripheral vascular disease, unspecified: Secondary | ICD-10-CM | POA: Diagnosis not present

## 2023-04-23 DIAGNOSIS — E782 Mixed hyperlipidemia: Secondary | ICD-10-CM | POA: Diagnosis not present

## 2023-04-23 DIAGNOSIS — F32A Depression, unspecified: Secondary | ICD-10-CM | POA: Diagnosis not present

## 2023-04-23 DIAGNOSIS — F329 Major depressive disorder, single episode, unspecified: Secondary | ICD-10-CM | POA: Diagnosis not present

## 2023-04-23 DIAGNOSIS — L97811 Non-pressure chronic ulcer of other part of right lower leg limited to breakdown of skin: Secondary | ICD-10-CM | POA: Diagnosis not present

## 2023-04-23 DIAGNOSIS — G8929 Other chronic pain: Secondary | ICD-10-CM | POA: Diagnosis not present

## 2023-04-23 DIAGNOSIS — I890 Lymphedema, not elsewhere classified: Secondary | ICD-10-CM | POA: Diagnosis not present

## 2023-04-23 DIAGNOSIS — F4321 Adjustment disorder with depressed mood: Secondary | ICD-10-CM | POA: Diagnosis not present

## 2023-04-27 DIAGNOSIS — Z7901 Long term (current) use of anticoagulants: Secondary | ICD-10-CM | POA: Diagnosis not present

## 2023-04-27 DIAGNOSIS — R600 Localized edema: Secondary | ICD-10-CM | POA: Diagnosis not present

## 2023-04-27 DIAGNOSIS — F4321 Adjustment disorder with depressed mood: Secondary | ICD-10-CM | POA: Diagnosis not present

## 2023-04-27 DIAGNOSIS — I890 Lymphedema, not elsewhere classified: Secondary | ICD-10-CM | POA: Diagnosis not present

## 2023-04-27 DIAGNOSIS — F0394 Unspecified dementia, unspecified severity, with anxiety: Secondary | ICD-10-CM | POA: Diagnosis not present

## 2023-04-27 DIAGNOSIS — E559 Vitamin D deficiency, unspecified: Secondary | ICD-10-CM | POA: Diagnosis not present

## 2023-04-27 DIAGNOSIS — E782 Mixed hyperlipidemia: Secondary | ICD-10-CM | POA: Diagnosis not present

## 2023-04-27 DIAGNOSIS — I739 Peripheral vascular disease, unspecified: Secondary | ICD-10-CM | POA: Diagnosis not present

## 2023-04-27 DIAGNOSIS — F0393 Unspecified dementia, unspecified severity, with mood disturbance: Secondary | ICD-10-CM | POA: Diagnosis not present

## 2023-04-27 DIAGNOSIS — I482 Chronic atrial fibrillation, unspecified: Secondary | ICD-10-CM | POA: Diagnosis not present

## 2023-04-27 DIAGNOSIS — G8929 Other chronic pain: Secondary | ICD-10-CM | POA: Diagnosis not present

## 2023-04-27 DIAGNOSIS — I87321 Chronic venous hypertension (idiopathic) with inflammation of right lower extremity: Secondary | ICD-10-CM | POA: Diagnosis not present

## 2023-04-27 DIAGNOSIS — F32A Depression, unspecified: Secondary | ICD-10-CM | POA: Diagnosis not present

## 2023-04-27 DIAGNOSIS — L97811 Non-pressure chronic ulcer of other part of right lower leg limited to breakdown of skin: Secondary | ICD-10-CM | POA: Diagnosis not present

## 2023-04-30 DIAGNOSIS — L97811 Non-pressure chronic ulcer of other part of right lower leg limited to breakdown of skin: Secondary | ICD-10-CM | POA: Diagnosis not present

## 2023-04-30 DIAGNOSIS — F32A Depression, unspecified: Secondary | ICD-10-CM | POA: Diagnosis not present

## 2023-04-30 DIAGNOSIS — I482 Chronic atrial fibrillation, unspecified: Secondary | ICD-10-CM | POA: Diagnosis not present

## 2023-04-30 DIAGNOSIS — R600 Localized edema: Secondary | ICD-10-CM | POA: Diagnosis not present

## 2023-04-30 DIAGNOSIS — I890 Lymphedema, not elsewhere classified: Secondary | ICD-10-CM | POA: Diagnosis not present

## 2023-04-30 DIAGNOSIS — E782 Mixed hyperlipidemia: Secondary | ICD-10-CM | POA: Diagnosis not present

## 2023-04-30 DIAGNOSIS — Z7901 Long term (current) use of anticoagulants: Secondary | ICD-10-CM | POA: Diagnosis not present

## 2023-04-30 DIAGNOSIS — I739 Peripheral vascular disease, unspecified: Secondary | ICD-10-CM | POA: Diagnosis not present

## 2023-04-30 DIAGNOSIS — F4321 Adjustment disorder with depressed mood: Secondary | ICD-10-CM | POA: Diagnosis not present

## 2023-04-30 DIAGNOSIS — E559 Vitamin D deficiency, unspecified: Secondary | ICD-10-CM | POA: Diagnosis not present

## 2023-04-30 DIAGNOSIS — F0394 Unspecified dementia, unspecified severity, with anxiety: Secondary | ICD-10-CM | POA: Diagnosis not present

## 2023-04-30 DIAGNOSIS — I87321 Chronic venous hypertension (idiopathic) with inflammation of right lower extremity: Secondary | ICD-10-CM | POA: Diagnosis not present

## 2023-04-30 DIAGNOSIS — G8929 Other chronic pain: Secondary | ICD-10-CM | POA: Diagnosis not present

## 2023-04-30 DIAGNOSIS — F0393 Unspecified dementia, unspecified severity, with mood disturbance: Secondary | ICD-10-CM | POA: Diagnosis not present

## 2023-05-04 DIAGNOSIS — F4321 Adjustment disorder with depressed mood: Secondary | ICD-10-CM | POA: Diagnosis not present

## 2023-05-04 DIAGNOSIS — F32A Depression, unspecified: Secondary | ICD-10-CM | POA: Diagnosis not present

## 2023-05-04 DIAGNOSIS — I890 Lymphedema, not elsewhere classified: Secondary | ICD-10-CM | POA: Diagnosis not present

## 2023-05-04 DIAGNOSIS — E782 Mixed hyperlipidemia: Secondary | ICD-10-CM | POA: Diagnosis not present

## 2023-05-04 DIAGNOSIS — F0393 Unspecified dementia, unspecified severity, with mood disturbance: Secondary | ICD-10-CM | POA: Diagnosis not present

## 2023-05-04 DIAGNOSIS — I739 Peripheral vascular disease, unspecified: Secondary | ICD-10-CM | POA: Diagnosis not present

## 2023-05-04 DIAGNOSIS — I87321 Chronic venous hypertension (idiopathic) with inflammation of right lower extremity: Secondary | ICD-10-CM | POA: Diagnosis not present

## 2023-05-04 DIAGNOSIS — L97811 Non-pressure chronic ulcer of other part of right lower leg limited to breakdown of skin: Secondary | ICD-10-CM | POA: Diagnosis not present

## 2023-05-04 DIAGNOSIS — Z7901 Long term (current) use of anticoagulants: Secondary | ICD-10-CM | POA: Diagnosis not present

## 2023-05-04 DIAGNOSIS — G8929 Other chronic pain: Secondary | ICD-10-CM | POA: Diagnosis not present

## 2023-05-04 DIAGNOSIS — I482 Chronic atrial fibrillation, unspecified: Secondary | ICD-10-CM | POA: Diagnosis not present

## 2023-05-04 DIAGNOSIS — R600 Localized edema: Secondary | ICD-10-CM | POA: Diagnosis not present

## 2023-05-04 DIAGNOSIS — E559 Vitamin D deficiency, unspecified: Secondary | ICD-10-CM | POA: Diagnosis not present

## 2023-05-04 DIAGNOSIS — F0394 Unspecified dementia, unspecified severity, with anxiety: Secondary | ICD-10-CM | POA: Diagnosis not present

## 2023-05-08 DIAGNOSIS — I739 Peripheral vascular disease, unspecified: Secondary | ICD-10-CM | POA: Diagnosis not present

## 2023-05-08 DIAGNOSIS — E782 Mixed hyperlipidemia: Secondary | ICD-10-CM | POA: Diagnosis not present

## 2023-05-08 DIAGNOSIS — G8929 Other chronic pain: Secondary | ICD-10-CM | POA: Diagnosis not present

## 2023-05-08 DIAGNOSIS — L97811 Non-pressure chronic ulcer of other part of right lower leg limited to breakdown of skin: Secondary | ICD-10-CM | POA: Diagnosis not present

## 2023-05-08 DIAGNOSIS — E559 Vitamin D deficiency, unspecified: Secondary | ICD-10-CM | POA: Diagnosis not present

## 2023-05-08 DIAGNOSIS — F32A Depression, unspecified: Secondary | ICD-10-CM | POA: Diagnosis not present

## 2023-05-08 DIAGNOSIS — I87321 Chronic venous hypertension (idiopathic) with inflammation of right lower extremity: Secondary | ICD-10-CM | POA: Diagnosis not present

## 2023-05-08 DIAGNOSIS — F0394 Unspecified dementia, unspecified severity, with anxiety: Secondary | ICD-10-CM | POA: Diagnosis not present

## 2023-05-08 DIAGNOSIS — Z7901 Long term (current) use of anticoagulants: Secondary | ICD-10-CM | POA: Diagnosis not present

## 2023-05-08 DIAGNOSIS — I482 Chronic atrial fibrillation, unspecified: Secondary | ICD-10-CM | POA: Diagnosis not present

## 2023-05-08 DIAGNOSIS — R600 Localized edema: Secondary | ICD-10-CM | POA: Diagnosis not present

## 2023-05-08 DIAGNOSIS — F0393 Unspecified dementia, unspecified severity, with mood disturbance: Secondary | ICD-10-CM | POA: Diagnosis not present

## 2023-05-08 DIAGNOSIS — F4321 Adjustment disorder with depressed mood: Secondary | ICD-10-CM | POA: Diagnosis not present

## 2023-05-08 DIAGNOSIS — I890 Lymphedema, not elsewhere classified: Secondary | ICD-10-CM | POA: Diagnosis not present

## 2023-05-11 DIAGNOSIS — G8929 Other chronic pain: Secondary | ICD-10-CM | POA: Diagnosis not present

## 2023-05-11 DIAGNOSIS — I87321 Chronic venous hypertension (idiopathic) with inflammation of right lower extremity: Secondary | ICD-10-CM | POA: Diagnosis not present

## 2023-05-11 DIAGNOSIS — E559 Vitamin D deficiency, unspecified: Secondary | ICD-10-CM | POA: Diagnosis not present

## 2023-05-11 DIAGNOSIS — Z7901 Long term (current) use of anticoagulants: Secondary | ICD-10-CM | POA: Diagnosis not present

## 2023-05-11 DIAGNOSIS — F32A Depression, unspecified: Secondary | ICD-10-CM | POA: Diagnosis not present

## 2023-05-11 DIAGNOSIS — E782 Mixed hyperlipidemia: Secondary | ICD-10-CM | POA: Diagnosis not present

## 2023-05-11 DIAGNOSIS — R600 Localized edema: Secondary | ICD-10-CM | POA: Diagnosis not present

## 2023-05-11 DIAGNOSIS — L97811 Non-pressure chronic ulcer of other part of right lower leg limited to breakdown of skin: Secondary | ICD-10-CM | POA: Diagnosis not present

## 2023-05-11 DIAGNOSIS — I739 Peripheral vascular disease, unspecified: Secondary | ICD-10-CM | POA: Diagnosis not present

## 2023-05-11 DIAGNOSIS — F4321 Adjustment disorder with depressed mood: Secondary | ICD-10-CM | POA: Diagnosis not present

## 2023-05-11 DIAGNOSIS — F0394 Unspecified dementia, unspecified severity, with anxiety: Secondary | ICD-10-CM | POA: Diagnosis not present

## 2023-05-11 DIAGNOSIS — F0393 Unspecified dementia, unspecified severity, with mood disturbance: Secondary | ICD-10-CM | POA: Diagnosis not present

## 2023-05-11 DIAGNOSIS — I890 Lymphedema, not elsewhere classified: Secondary | ICD-10-CM | POA: Diagnosis not present

## 2023-05-11 DIAGNOSIS — I482 Chronic atrial fibrillation, unspecified: Secondary | ICD-10-CM | POA: Diagnosis not present

## 2023-05-14 DIAGNOSIS — I87321 Chronic venous hypertension (idiopathic) with inflammation of right lower extremity: Secondary | ICD-10-CM | POA: Diagnosis not present

## 2023-05-14 DIAGNOSIS — F411 Generalized anxiety disorder: Secondary | ICD-10-CM | POA: Diagnosis not present

## 2023-05-14 DIAGNOSIS — G8929 Other chronic pain: Secondary | ICD-10-CM | POA: Diagnosis not present

## 2023-05-14 DIAGNOSIS — F0393 Unspecified dementia, unspecified severity, with mood disturbance: Secondary | ICD-10-CM | POA: Diagnosis not present

## 2023-05-14 DIAGNOSIS — I739 Peripheral vascular disease, unspecified: Secondary | ICD-10-CM | POA: Diagnosis not present

## 2023-05-14 DIAGNOSIS — I890 Lymphedema, not elsewhere classified: Secondary | ICD-10-CM | POA: Diagnosis not present

## 2023-05-14 DIAGNOSIS — E559 Vitamin D deficiency, unspecified: Secondary | ICD-10-CM | POA: Diagnosis not present

## 2023-05-14 DIAGNOSIS — G300 Alzheimer's disease with early onset: Secondary | ICD-10-CM | POA: Diagnosis not present

## 2023-05-14 DIAGNOSIS — L97811 Non-pressure chronic ulcer of other part of right lower leg limited to breakdown of skin: Secondary | ICD-10-CM | POA: Diagnosis not present

## 2023-05-14 DIAGNOSIS — F331 Major depressive disorder, recurrent, moderate: Secondary | ICD-10-CM | POA: Diagnosis not present

## 2023-05-14 DIAGNOSIS — F32A Depression, unspecified: Secondary | ICD-10-CM | POA: Diagnosis not present

## 2023-05-14 DIAGNOSIS — F4321 Adjustment disorder with depressed mood: Secondary | ICD-10-CM | POA: Diagnosis not present

## 2023-05-14 DIAGNOSIS — I872 Venous insufficiency (chronic) (peripheral): Secondary | ICD-10-CM | POA: Diagnosis not present

## 2023-05-14 DIAGNOSIS — Z7901 Long term (current) use of anticoagulants: Secondary | ICD-10-CM | POA: Diagnosis not present

## 2023-05-14 DIAGNOSIS — E782 Mixed hyperlipidemia: Secondary | ICD-10-CM | POA: Diagnosis not present

## 2023-05-14 DIAGNOSIS — R600 Localized edema: Secondary | ICD-10-CM | POA: Diagnosis not present

## 2023-05-14 DIAGNOSIS — F0394 Unspecified dementia, unspecified severity, with anxiety: Secondary | ICD-10-CM | POA: Diagnosis not present

## 2023-05-14 DIAGNOSIS — I482 Chronic atrial fibrillation, unspecified: Secondary | ICD-10-CM | POA: Diagnosis not present

## 2023-05-17 DIAGNOSIS — I890 Lymphedema, not elsewhere classified: Secondary | ICD-10-CM | POA: Diagnosis not present

## 2023-05-17 DIAGNOSIS — F32A Depression, unspecified: Secondary | ICD-10-CM | POA: Diagnosis not present

## 2023-05-17 DIAGNOSIS — F0394 Unspecified dementia, unspecified severity, with anxiety: Secondary | ICD-10-CM | POA: Diagnosis not present

## 2023-05-17 DIAGNOSIS — F4321 Adjustment disorder with depressed mood: Secondary | ICD-10-CM | POA: Diagnosis not present

## 2023-05-17 DIAGNOSIS — I739 Peripheral vascular disease, unspecified: Secondary | ICD-10-CM | POA: Diagnosis not present

## 2023-05-17 DIAGNOSIS — I482 Chronic atrial fibrillation, unspecified: Secondary | ICD-10-CM | POA: Diagnosis not present

## 2023-05-17 DIAGNOSIS — L97811 Non-pressure chronic ulcer of other part of right lower leg limited to breakdown of skin: Secondary | ICD-10-CM | POA: Diagnosis not present

## 2023-05-17 DIAGNOSIS — Z7901 Long term (current) use of anticoagulants: Secondary | ICD-10-CM | POA: Diagnosis not present

## 2023-05-17 DIAGNOSIS — I87321 Chronic venous hypertension (idiopathic) with inflammation of right lower extremity: Secondary | ICD-10-CM | POA: Diagnosis not present

## 2023-05-17 DIAGNOSIS — E559 Vitamin D deficiency, unspecified: Secondary | ICD-10-CM | POA: Diagnosis not present

## 2023-05-17 DIAGNOSIS — R600 Localized edema: Secondary | ICD-10-CM | POA: Diagnosis not present

## 2023-05-17 DIAGNOSIS — F0393 Unspecified dementia, unspecified severity, with mood disturbance: Secondary | ICD-10-CM | POA: Diagnosis not present

## 2023-05-17 DIAGNOSIS — E782 Mixed hyperlipidemia: Secondary | ICD-10-CM | POA: Diagnosis not present

## 2023-05-17 DIAGNOSIS — G8929 Other chronic pain: Secondary | ICD-10-CM | POA: Diagnosis not present

## 2023-05-18 DIAGNOSIS — I739 Peripheral vascular disease, unspecified: Secondary | ICD-10-CM | POA: Diagnosis not present

## 2023-05-18 DIAGNOSIS — L97811 Non-pressure chronic ulcer of other part of right lower leg limited to breakdown of skin: Secondary | ICD-10-CM | POA: Diagnosis not present

## 2023-05-18 DIAGNOSIS — I87321 Chronic venous hypertension (idiopathic) with inflammation of right lower extremity: Secondary | ICD-10-CM | POA: Diagnosis not present

## 2023-05-22 ENCOUNTER — Ambulatory Visit: Payer: Medicare Other | Admitting: Podiatry

## 2023-05-22 DIAGNOSIS — L97811 Non-pressure chronic ulcer of other part of right lower leg limited to breakdown of skin: Secondary | ICD-10-CM | POA: Diagnosis not present

## 2023-05-22 DIAGNOSIS — F4321 Adjustment disorder with depressed mood: Secondary | ICD-10-CM | POA: Diagnosis not present

## 2023-05-22 DIAGNOSIS — F32A Depression, unspecified: Secondary | ICD-10-CM | POA: Diagnosis not present

## 2023-05-22 DIAGNOSIS — R600 Localized edema: Secondary | ICD-10-CM | POA: Diagnosis not present

## 2023-05-22 DIAGNOSIS — Z7901 Long term (current) use of anticoagulants: Secondary | ICD-10-CM | POA: Diagnosis not present

## 2023-05-22 DIAGNOSIS — I482 Chronic atrial fibrillation, unspecified: Secondary | ICD-10-CM | POA: Diagnosis not present

## 2023-05-22 DIAGNOSIS — I890 Lymphedema, not elsewhere classified: Secondary | ICD-10-CM | POA: Diagnosis not present

## 2023-05-22 DIAGNOSIS — E559 Vitamin D deficiency, unspecified: Secondary | ICD-10-CM | POA: Diagnosis not present

## 2023-05-22 DIAGNOSIS — I739 Peripheral vascular disease, unspecified: Secondary | ICD-10-CM | POA: Diagnosis not present

## 2023-05-22 DIAGNOSIS — I87321 Chronic venous hypertension (idiopathic) with inflammation of right lower extremity: Secondary | ICD-10-CM | POA: Diagnosis not present

## 2023-05-22 DIAGNOSIS — F0393 Unspecified dementia, unspecified severity, with mood disturbance: Secondary | ICD-10-CM | POA: Diagnosis not present

## 2023-05-22 DIAGNOSIS — G8929 Other chronic pain: Secondary | ICD-10-CM | POA: Diagnosis not present

## 2023-05-22 DIAGNOSIS — E782 Mixed hyperlipidemia: Secondary | ICD-10-CM | POA: Diagnosis not present

## 2023-05-22 DIAGNOSIS — F0394 Unspecified dementia, unspecified severity, with anxiety: Secondary | ICD-10-CM | POA: Diagnosis not present

## 2023-05-25 DIAGNOSIS — I482 Chronic atrial fibrillation, unspecified: Secondary | ICD-10-CM | POA: Diagnosis not present

## 2023-05-25 DIAGNOSIS — E559 Vitamin D deficiency, unspecified: Secondary | ICD-10-CM | POA: Diagnosis not present

## 2023-05-25 DIAGNOSIS — F0394 Unspecified dementia, unspecified severity, with anxiety: Secondary | ICD-10-CM | POA: Diagnosis not present

## 2023-05-25 DIAGNOSIS — Z7901 Long term (current) use of anticoagulants: Secondary | ICD-10-CM | POA: Diagnosis not present

## 2023-05-25 DIAGNOSIS — F4321 Adjustment disorder with depressed mood: Secondary | ICD-10-CM | POA: Diagnosis not present

## 2023-05-25 DIAGNOSIS — I890 Lymphedema, not elsewhere classified: Secondary | ICD-10-CM | POA: Diagnosis not present

## 2023-05-25 DIAGNOSIS — F0393 Unspecified dementia, unspecified severity, with mood disturbance: Secondary | ICD-10-CM | POA: Diagnosis not present

## 2023-05-25 DIAGNOSIS — L97811 Non-pressure chronic ulcer of other part of right lower leg limited to breakdown of skin: Secondary | ICD-10-CM | POA: Diagnosis not present

## 2023-05-25 DIAGNOSIS — I739 Peripheral vascular disease, unspecified: Secondary | ICD-10-CM | POA: Diagnosis not present

## 2023-05-25 DIAGNOSIS — G8929 Other chronic pain: Secondary | ICD-10-CM | POA: Diagnosis not present

## 2023-05-25 DIAGNOSIS — I87321 Chronic venous hypertension (idiopathic) with inflammation of right lower extremity: Secondary | ICD-10-CM | POA: Diagnosis not present

## 2023-05-25 DIAGNOSIS — F32A Depression, unspecified: Secondary | ICD-10-CM | POA: Diagnosis not present

## 2023-05-25 DIAGNOSIS — R600 Localized edema: Secondary | ICD-10-CM | POA: Diagnosis not present

## 2023-05-25 DIAGNOSIS — E782 Mixed hyperlipidemia: Secondary | ICD-10-CM | POA: Diagnosis not present

## 2023-05-26 DIAGNOSIS — I482 Chronic atrial fibrillation, unspecified: Secondary | ICD-10-CM | POA: Diagnosis not present

## 2023-05-26 DIAGNOSIS — E559 Vitamin D deficiency, unspecified: Secondary | ICD-10-CM | POA: Diagnosis not present

## 2023-05-26 DIAGNOSIS — E782 Mixed hyperlipidemia: Secondary | ICD-10-CM | POA: Diagnosis not present

## 2023-05-26 DIAGNOSIS — L97828 Non-pressure chronic ulcer of other part of left lower leg with other specified severity: Secondary | ICD-10-CM | POA: Diagnosis not present

## 2023-05-26 DIAGNOSIS — N401 Enlarged prostate with lower urinary tract symptoms: Secondary | ICD-10-CM | POA: Diagnosis not present

## 2023-05-29 DIAGNOSIS — F4321 Adjustment disorder with depressed mood: Secondary | ICD-10-CM | POA: Diagnosis not present

## 2023-05-29 DIAGNOSIS — E782 Mixed hyperlipidemia: Secondary | ICD-10-CM | POA: Diagnosis not present

## 2023-05-29 DIAGNOSIS — F0394 Unspecified dementia, unspecified severity, with anxiety: Secondary | ICD-10-CM | POA: Diagnosis not present

## 2023-05-29 DIAGNOSIS — I739 Peripheral vascular disease, unspecified: Secondary | ICD-10-CM | POA: Diagnosis not present

## 2023-05-29 DIAGNOSIS — I87321 Chronic venous hypertension (idiopathic) with inflammation of right lower extremity: Secondary | ICD-10-CM | POA: Diagnosis not present

## 2023-05-29 DIAGNOSIS — F0393 Unspecified dementia, unspecified severity, with mood disturbance: Secondary | ICD-10-CM | POA: Diagnosis not present

## 2023-05-29 DIAGNOSIS — E559 Vitamin D deficiency, unspecified: Secondary | ICD-10-CM | POA: Diagnosis not present

## 2023-05-29 DIAGNOSIS — F32A Depression, unspecified: Secondary | ICD-10-CM | POA: Diagnosis not present

## 2023-05-29 DIAGNOSIS — R600 Localized edema: Secondary | ICD-10-CM | POA: Diagnosis not present

## 2023-05-29 DIAGNOSIS — L97811 Non-pressure chronic ulcer of other part of right lower leg limited to breakdown of skin: Secondary | ICD-10-CM | POA: Diagnosis not present

## 2023-05-29 DIAGNOSIS — G8929 Other chronic pain: Secondary | ICD-10-CM | POA: Diagnosis not present

## 2023-05-29 DIAGNOSIS — Z7901 Long term (current) use of anticoagulants: Secondary | ICD-10-CM | POA: Diagnosis not present

## 2023-05-29 DIAGNOSIS — I890 Lymphedema, not elsewhere classified: Secondary | ICD-10-CM | POA: Diagnosis not present

## 2023-05-29 DIAGNOSIS — I482 Chronic atrial fibrillation, unspecified: Secondary | ICD-10-CM | POA: Diagnosis not present

## 2023-06-01 DIAGNOSIS — I87321 Chronic venous hypertension (idiopathic) with inflammation of right lower extremity: Secondary | ICD-10-CM | POA: Diagnosis not present

## 2023-06-01 DIAGNOSIS — E782 Mixed hyperlipidemia: Secondary | ICD-10-CM | POA: Diagnosis not present

## 2023-06-01 DIAGNOSIS — F4321 Adjustment disorder with depressed mood: Secondary | ICD-10-CM | POA: Diagnosis not present

## 2023-06-01 DIAGNOSIS — I739 Peripheral vascular disease, unspecified: Secondary | ICD-10-CM | POA: Diagnosis not present

## 2023-06-01 DIAGNOSIS — Z7901 Long term (current) use of anticoagulants: Secondary | ICD-10-CM | POA: Diagnosis not present

## 2023-06-01 DIAGNOSIS — E559 Vitamin D deficiency, unspecified: Secondary | ICD-10-CM | POA: Diagnosis not present

## 2023-06-01 DIAGNOSIS — F32A Depression, unspecified: Secondary | ICD-10-CM | POA: Diagnosis not present

## 2023-06-01 DIAGNOSIS — L97811 Non-pressure chronic ulcer of other part of right lower leg limited to breakdown of skin: Secondary | ICD-10-CM | POA: Diagnosis not present

## 2023-06-01 DIAGNOSIS — R600 Localized edema: Secondary | ICD-10-CM | POA: Diagnosis not present

## 2023-06-01 DIAGNOSIS — I890 Lymphedema, not elsewhere classified: Secondary | ICD-10-CM | POA: Diagnosis not present

## 2023-06-01 DIAGNOSIS — F0393 Unspecified dementia, unspecified severity, with mood disturbance: Secondary | ICD-10-CM | POA: Diagnosis not present

## 2023-06-01 DIAGNOSIS — F0394 Unspecified dementia, unspecified severity, with anxiety: Secondary | ICD-10-CM | POA: Diagnosis not present

## 2023-06-01 DIAGNOSIS — G8929 Other chronic pain: Secondary | ICD-10-CM | POA: Diagnosis not present

## 2023-06-01 DIAGNOSIS — I482 Chronic atrial fibrillation, unspecified: Secondary | ICD-10-CM | POA: Diagnosis not present

## 2023-06-05 ENCOUNTER — Ambulatory Visit: Payer: Medicare Other | Admitting: Podiatry

## 2023-06-05 DIAGNOSIS — M79674 Pain in right toe(s): Secondary | ICD-10-CM | POA: Diagnosis not present

## 2023-06-05 DIAGNOSIS — I890 Lymphedema, not elsewhere classified: Secondary | ICD-10-CM

## 2023-06-05 DIAGNOSIS — B351 Tinea unguium: Secondary | ICD-10-CM

## 2023-06-05 DIAGNOSIS — M79675 Pain in left toe(s): Secondary | ICD-10-CM | POA: Diagnosis not present

## 2023-06-05 NOTE — Progress Notes (Signed)
 Subjective:  Patient ID: [name] [name], male    DOB: November 16, 1940,  MRN: [id number]  [name] [name] presents to clinic today for:  Chief Complaint  Patient presents with   [facility]    Needs nail care. Not diabetic. Takes Eliquis . Has weeping edema of his left foot and LL. Does have some open sores. He is seen by wound care at the facility 2x weekly. Daughter is with him today.    Patient notes nails are thick and elongated, causing pain in shoe gear when ambulating.  Patient resides in a facility and has dementia.  A caregiver is with him today.  He usually is seen by the in-house podiatrist at the facility but was not seen at the last appointment.  He states that he is over 3 months since nail care was last performed.  They do plan on getting back on schedule with the in-house podiatrist and would like to come here once yearly for an overall exam.  He has significant swelling in both legs that often results in cellulitis, which she is currently being treated for the cellulitis.  He is currently being treated for this at the facility but they do not feel it is helping.  He does currently have drainage coming from his legs.  Denies fever, nausea/vomiting  PCP was last seen around 04/13/2023.  Past Medical History:  Diagnosis Date   CAD (coronary artery disease)    LHC 11/06: pLAD 90, pD1 30, oD2 25 with inf branch 60; septal perf ostial 95; prox MOM 25, RCA 25, oPL2 95, EF 65 >> PCI:  BMS to proximal LAD    FH: CAD (coronary artery disease)    History of echocardiogram    Echo 3/17:  Mod LVH, EF 55-60, no RWMA, mild AI, mild to mod RAE   History of tobacco abuse    Hyperlipidemia    Persistent atrial fibrillation (HCC)    Xarelto  for a/c    No Known Allergies  Objective:  [name] [name] is a pleasant 83 y.o. male in NAD. AAO x 3.  Vascular Examination: Patient has nonpalpable pedal pulses secondary to significant edema bilateral legs, ankles and feet.  Delayed capillary refill bilateral toes.   Absent digital hair bilateral.    Dermatological Examination: Skin is taut, shiny and atrophic bilateral.  There is diffuse erythema along both legs and feet.  There is serous drainage from both legs.  No isolated deep or wide ulcerations are noted.  Nails are 4 mm thick, with yellowish/brown discoloration, subungual debris and distal onycholysis x10.  There is pain with compression of nails x10.    Patient qualifies for at-risk foot care because of lymphedema/PVD.  Assessment/Plan: 1. Lymphedema of lower extremity   2. Pain due to onychomycosis of toenails of both feet     Mycotic nails x10 were sharply debrided with sterile nail nippers and power debriding burr to decrease bulk and length.  Discussed options of formal lymphedema therapy at an outpatient facility.  [name] does offer this service.  The caregiver stated she will speak to someone at his residential facility and see if they feel patient proceed with this treatment.  We also discussed lymphedema pumps but he cannot have/use these with active cellulitis.  They are not sure if this would be a good idea since he would rely on someone to place these to his legs and be with him for an extended period of time while the treatment is ongoing.  The  caregiver will notify our office if she would like me to place the order for lymphedema therapy.  They will follow-up with the in-house podiatrist at the facility.  They will come back here once a year for an overall foot check.   Awanda CHARM Imperial, DPM, FACFAS [facility]     [address] [place], [place] [address]                Office [phone]  Fax [phone]

## 2023-06-06 ENCOUNTER — Telehealth: Payer: Self-pay | Admitting: Podiatry

## 2023-06-06 ENCOUNTER — Encounter: Payer: Self-pay | Admitting: Podiatry

## 2023-06-06 DIAGNOSIS — I482 Chronic atrial fibrillation, unspecified: Secondary | ICD-10-CM | POA: Diagnosis not present

## 2023-06-06 DIAGNOSIS — I872 Venous insufficiency (chronic) (peripheral): Secondary | ICD-10-CM | POA: Diagnosis not present

## 2023-06-06 DIAGNOSIS — L97811 Non-pressure chronic ulcer of other part of right lower leg limited to breakdown of skin: Secondary | ICD-10-CM | POA: Diagnosis not present

## 2023-06-06 DIAGNOSIS — F0394 Unspecified dementia, unspecified severity, with anxiety: Secondary | ICD-10-CM | POA: Diagnosis not present

## 2023-06-06 DIAGNOSIS — Z7901 Long term (current) use of anticoagulants: Secondary | ICD-10-CM | POA: Diagnosis not present

## 2023-06-06 DIAGNOSIS — I890 Lymphedema, not elsewhere classified: Secondary | ICD-10-CM

## 2023-06-06 DIAGNOSIS — F0393 Unspecified dementia, unspecified severity, with mood disturbance: Secondary | ICD-10-CM | POA: Diagnosis not present

## 2023-06-06 DIAGNOSIS — G8929 Other chronic pain: Secondary | ICD-10-CM | POA: Diagnosis not present

## 2023-06-06 DIAGNOSIS — E559 Vitamin D deficiency, unspecified: Secondary | ICD-10-CM | POA: Diagnosis not present

## 2023-06-06 DIAGNOSIS — F4321 Adjustment disorder with depressed mood: Secondary | ICD-10-CM | POA: Diagnosis not present

## 2023-06-06 DIAGNOSIS — I87321 Chronic venous hypertension (idiopathic) with inflammation of right lower extremity: Secondary | ICD-10-CM | POA: Diagnosis not present

## 2023-06-06 DIAGNOSIS — R600 Localized edema: Secondary | ICD-10-CM | POA: Diagnosis not present

## 2023-06-06 DIAGNOSIS — E782 Mixed hyperlipidemia: Secondary | ICD-10-CM | POA: Diagnosis not present

## 2023-06-06 DIAGNOSIS — I739 Peripheral vascular disease, unspecified: Secondary | ICD-10-CM | POA: Diagnosis not present

## 2023-06-06 DIAGNOSIS — F32A Depression, unspecified: Secondary | ICD-10-CM | POA: Diagnosis not present

## 2023-06-06 NOTE — Telephone Encounter (Signed)
 Daughter is calling to speak with a nurse, requesting a referral to [facility] Physical Therapy for Patient. Please contact daughter [phone]

## 2023-06-08 DIAGNOSIS — F0394 Unspecified dementia, unspecified severity, with anxiety: Secondary | ICD-10-CM | POA: Diagnosis not present

## 2023-06-08 DIAGNOSIS — F32A Depression, unspecified: Secondary | ICD-10-CM | POA: Diagnosis not present

## 2023-06-08 DIAGNOSIS — I739 Peripheral vascular disease, unspecified: Secondary | ICD-10-CM | POA: Diagnosis not present

## 2023-06-08 DIAGNOSIS — I890 Lymphedema, not elsewhere classified: Secondary | ICD-10-CM | POA: Diagnosis not present

## 2023-06-08 DIAGNOSIS — R600 Localized edema: Secondary | ICD-10-CM | POA: Diagnosis not present

## 2023-06-08 DIAGNOSIS — Z7901 Long term (current) use of anticoagulants: Secondary | ICD-10-CM | POA: Diagnosis not present

## 2023-06-08 DIAGNOSIS — E782 Mixed hyperlipidemia: Secondary | ICD-10-CM | POA: Diagnosis not present

## 2023-06-08 DIAGNOSIS — F4321 Adjustment disorder with depressed mood: Secondary | ICD-10-CM | POA: Diagnosis not present

## 2023-06-08 DIAGNOSIS — L97811 Non-pressure chronic ulcer of other part of right lower leg limited to breakdown of skin: Secondary | ICD-10-CM | POA: Diagnosis not present

## 2023-06-08 DIAGNOSIS — G8929 Other chronic pain: Secondary | ICD-10-CM | POA: Diagnosis not present

## 2023-06-08 DIAGNOSIS — I87321 Chronic venous hypertension (idiopathic) with inflammation of right lower extremity: Secondary | ICD-10-CM | POA: Diagnosis not present

## 2023-06-08 DIAGNOSIS — I482 Chronic atrial fibrillation, unspecified: Secondary | ICD-10-CM | POA: Diagnosis not present

## 2023-06-08 DIAGNOSIS — E559 Vitamin D deficiency, unspecified: Secondary | ICD-10-CM | POA: Diagnosis not present

## 2023-06-08 DIAGNOSIS — F0393 Unspecified dementia, unspecified severity, with mood disturbance: Secondary | ICD-10-CM | POA: Diagnosis not present

## 2023-06-10 DIAGNOSIS — I739 Peripheral vascular disease, unspecified: Secondary | ICD-10-CM | POA: Diagnosis not present

## 2023-06-10 DIAGNOSIS — E782 Mixed hyperlipidemia: Secondary | ICD-10-CM | POA: Diagnosis not present

## 2023-06-10 DIAGNOSIS — Z7901 Long term (current) use of anticoagulants: Secondary | ICD-10-CM | POA: Diagnosis not present

## 2023-06-10 DIAGNOSIS — F4321 Adjustment disorder with depressed mood: Secondary | ICD-10-CM | POA: Diagnosis not present

## 2023-06-10 DIAGNOSIS — E559 Vitamin D deficiency, unspecified: Secondary | ICD-10-CM | POA: Diagnosis not present

## 2023-06-10 DIAGNOSIS — I87321 Chronic venous hypertension (idiopathic) with inflammation of right lower extremity: Secondary | ICD-10-CM | POA: Diagnosis not present

## 2023-06-10 DIAGNOSIS — F32A Depression, unspecified: Secondary | ICD-10-CM | POA: Diagnosis not present

## 2023-06-10 DIAGNOSIS — I890 Lymphedema, not elsewhere classified: Secondary | ICD-10-CM | POA: Diagnosis not present

## 2023-06-10 DIAGNOSIS — F0394 Unspecified dementia, unspecified severity, with anxiety: Secondary | ICD-10-CM | POA: Diagnosis not present

## 2023-06-10 DIAGNOSIS — I482 Chronic atrial fibrillation, unspecified: Secondary | ICD-10-CM | POA: Diagnosis not present

## 2023-06-10 DIAGNOSIS — G8929 Other chronic pain: Secondary | ICD-10-CM | POA: Diagnosis not present

## 2023-06-10 DIAGNOSIS — F0393 Unspecified dementia, unspecified severity, with mood disturbance: Secondary | ICD-10-CM | POA: Diagnosis not present

## 2023-06-10 DIAGNOSIS — L97811 Non-pressure chronic ulcer of other part of right lower leg limited to breakdown of skin: Secondary | ICD-10-CM | POA: Diagnosis not present

## 2023-06-10 DIAGNOSIS — R600 Localized edema: Secondary | ICD-10-CM | POA: Diagnosis not present

## 2023-06-12 DIAGNOSIS — E782 Mixed hyperlipidemia: Secondary | ICD-10-CM | POA: Diagnosis not present

## 2023-06-12 DIAGNOSIS — E559 Vitamin D deficiency, unspecified: Secondary | ICD-10-CM | POA: Diagnosis not present

## 2023-06-12 DIAGNOSIS — I482 Chronic atrial fibrillation, unspecified: Secondary | ICD-10-CM | POA: Diagnosis not present

## 2023-06-12 DIAGNOSIS — I890 Lymphedema, not elsewhere classified: Secondary | ICD-10-CM | POA: Diagnosis not present

## 2023-06-12 DIAGNOSIS — F4321 Adjustment disorder with depressed mood: Secondary | ICD-10-CM | POA: Diagnosis not present

## 2023-06-12 DIAGNOSIS — F32A Depression, unspecified: Secondary | ICD-10-CM | POA: Diagnosis not present

## 2023-06-12 DIAGNOSIS — R600 Localized edema: Secondary | ICD-10-CM | POA: Diagnosis not present

## 2023-06-12 DIAGNOSIS — I87321 Chronic venous hypertension (idiopathic) with inflammation of right lower extremity: Secondary | ICD-10-CM | POA: Diagnosis not present

## 2023-06-12 DIAGNOSIS — Z7901 Long term (current) use of anticoagulants: Secondary | ICD-10-CM | POA: Diagnosis not present

## 2023-06-12 DIAGNOSIS — G8929 Other chronic pain: Secondary | ICD-10-CM | POA: Diagnosis not present

## 2023-06-12 DIAGNOSIS — F0393 Unspecified dementia, unspecified severity, with mood disturbance: Secondary | ICD-10-CM | POA: Diagnosis not present

## 2023-06-12 DIAGNOSIS — I739 Peripheral vascular disease, unspecified: Secondary | ICD-10-CM | POA: Diagnosis not present

## 2023-06-12 DIAGNOSIS — L97811 Non-pressure chronic ulcer of other part of right lower leg limited to breakdown of skin: Secondary | ICD-10-CM | POA: Diagnosis not present

## 2023-06-12 DIAGNOSIS — F0394 Unspecified dementia, unspecified severity, with anxiety: Secondary | ICD-10-CM | POA: Diagnosis not present

## 2023-06-19 DIAGNOSIS — G8929 Other chronic pain: Secondary | ICD-10-CM | POA: Diagnosis not present

## 2023-06-19 DIAGNOSIS — E559 Vitamin D deficiency, unspecified: Secondary | ICD-10-CM | POA: Diagnosis not present

## 2023-06-19 DIAGNOSIS — F4321 Adjustment disorder with depressed mood: Secondary | ICD-10-CM | POA: Diagnosis not present

## 2023-06-19 DIAGNOSIS — I890 Lymphedema, not elsewhere classified: Secondary | ICD-10-CM | POA: Diagnosis not present

## 2023-06-19 DIAGNOSIS — F0394 Unspecified dementia, unspecified severity, with anxiety: Secondary | ICD-10-CM | POA: Diagnosis not present

## 2023-06-19 DIAGNOSIS — L97811 Non-pressure chronic ulcer of other part of right lower leg limited to breakdown of skin: Secondary | ICD-10-CM | POA: Diagnosis not present

## 2023-06-19 DIAGNOSIS — I482 Chronic atrial fibrillation, unspecified: Secondary | ICD-10-CM | POA: Diagnosis not present

## 2023-06-19 DIAGNOSIS — E782 Mixed hyperlipidemia: Secondary | ICD-10-CM | POA: Diagnosis not present

## 2023-06-19 DIAGNOSIS — R600 Localized edema: Secondary | ICD-10-CM | POA: Diagnosis not present

## 2023-06-19 DIAGNOSIS — I87321 Chronic venous hypertension (idiopathic) with inflammation of right lower extremity: Secondary | ICD-10-CM | POA: Diagnosis not present

## 2023-06-19 DIAGNOSIS — F32A Depression, unspecified: Secondary | ICD-10-CM | POA: Diagnosis not present

## 2023-06-19 DIAGNOSIS — I739 Peripheral vascular disease, unspecified: Secondary | ICD-10-CM | POA: Diagnosis not present

## 2023-06-19 DIAGNOSIS — F0393 Unspecified dementia, unspecified severity, with mood disturbance: Secondary | ICD-10-CM | POA: Diagnosis not present

## 2023-06-19 DIAGNOSIS — Z7901 Long term (current) use of anticoagulants: Secondary | ICD-10-CM | POA: Diagnosis not present

## 2023-06-20 DIAGNOSIS — I87321 Chronic venous hypertension (idiopathic) with inflammation of right lower extremity: Secondary | ICD-10-CM | POA: Diagnosis not present

## 2023-06-20 DIAGNOSIS — L97811 Non-pressure chronic ulcer of other part of right lower leg limited to breakdown of skin: Secondary | ICD-10-CM | POA: Diagnosis not present

## 2023-06-20 DIAGNOSIS — I739 Peripheral vascular disease, unspecified: Secondary | ICD-10-CM | POA: Diagnosis not present

## 2023-06-26 DIAGNOSIS — F4321 Adjustment disorder with depressed mood: Secondary | ICD-10-CM | POA: Diagnosis not present

## 2023-06-26 DIAGNOSIS — F32A Depression, unspecified: Secondary | ICD-10-CM | POA: Diagnosis not present

## 2023-06-26 DIAGNOSIS — R600 Localized edema: Secondary | ICD-10-CM | POA: Diagnosis not present

## 2023-06-26 DIAGNOSIS — Z7901 Long term (current) use of anticoagulants: Secondary | ICD-10-CM | POA: Diagnosis not present

## 2023-06-26 DIAGNOSIS — G8929 Other chronic pain: Secondary | ICD-10-CM | POA: Diagnosis not present

## 2023-06-26 DIAGNOSIS — I739 Peripheral vascular disease, unspecified: Secondary | ICD-10-CM | POA: Diagnosis not present

## 2023-06-26 DIAGNOSIS — I482 Chronic atrial fibrillation, unspecified: Secondary | ICD-10-CM | POA: Diagnosis not present

## 2023-06-26 DIAGNOSIS — E782 Mixed hyperlipidemia: Secondary | ICD-10-CM | POA: Diagnosis not present

## 2023-06-26 DIAGNOSIS — L97811 Non-pressure chronic ulcer of other part of right lower leg limited to breakdown of skin: Secondary | ICD-10-CM | POA: Diagnosis not present

## 2023-06-26 DIAGNOSIS — E559 Vitamin D deficiency, unspecified: Secondary | ICD-10-CM | POA: Diagnosis not present

## 2023-06-26 DIAGNOSIS — I87321 Chronic venous hypertension (idiopathic) with inflammation of right lower extremity: Secondary | ICD-10-CM | POA: Diagnosis not present

## 2023-06-26 DIAGNOSIS — F0394 Unspecified dementia, unspecified severity, with anxiety: Secondary | ICD-10-CM | POA: Diagnosis not present

## 2023-06-26 DIAGNOSIS — I890 Lymphedema, not elsewhere classified: Secondary | ICD-10-CM | POA: Diagnosis not present

## 2023-06-26 DIAGNOSIS — F0393 Unspecified dementia, unspecified severity, with mood disturbance: Secondary | ICD-10-CM | POA: Diagnosis not present

## 2023-06-27 DIAGNOSIS — I10 Essential (primary) hypertension: Secondary | ICD-10-CM | POA: Diagnosis not present

## 2023-07-03 DIAGNOSIS — I890 Lymphedema, not elsewhere classified: Secondary | ICD-10-CM | POA: Diagnosis not present

## 2023-07-04 DIAGNOSIS — Z7901 Long term (current) use of anticoagulants: Secondary | ICD-10-CM | POA: Diagnosis not present

## 2023-07-04 DIAGNOSIS — L97811 Non-pressure chronic ulcer of other part of right lower leg limited to breakdown of skin: Secondary | ICD-10-CM | POA: Diagnosis not present

## 2023-07-04 DIAGNOSIS — F0394 Unspecified dementia, unspecified severity, with anxiety: Secondary | ICD-10-CM | POA: Diagnosis not present

## 2023-07-04 DIAGNOSIS — R600 Localized edema: Secondary | ICD-10-CM | POA: Diagnosis not present

## 2023-07-04 DIAGNOSIS — I890 Lymphedema, not elsewhere classified: Secondary | ICD-10-CM | POA: Diagnosis not present

## 2023-07-04 DIAGNOSIS — G8929 Other chronic pain: Secondary | ICD-10-CM | POA: Diagnosis not present

## 2023-07-04 DIAGNOSIS — E782 Mixed hyperlipidemia: Secondary | ICD-10-CM | POA: Diagnosis not present

## 2023-07-04 DIAGNOSIS — F0393 Unspecified dementia, unspecified severity, with mood disturbance: Secondary | ICD-10-CM | POA: Diagnosis not present

## 2023-07-04 DIAGNOSIS — F32A Depression, unspecified: Secondary | ICD-10-CM | POA: Diagnosis not present

## 2023-07-04 DIAGNOSIS — E559 Vitamin D deficiency, unspecified: Secondary | ICD-10-CM | POA: Diagnosis not present

## 2023-07-04 DIAGNOSIS — F4321 Adjustment disorder with depressed mood: Secondary | ICD-10-CM | POA: Diagnosis not present

## 2023-07-04 DIAGNOSIS — I482 Chronic atrial fibrillation, unspecified: Secondary | ICD-10-CM | POA: Diagnosis not present

## 2023-07-04 DIAGNOSIS — I87321 Chronic venous hypertension (idiopathic) with inflammation of right lower extremity: Secondary | ICD-10-CM | POA: Diagnosis not present

## 2023-07-04 DIAGNOSIS — I739 Peripheral vascular disease, unspecified: Secondary | ICD-10-CM | POA: Diagnosis not present

## 2023-07-05 DIAGNOSIS — I890 Lymphedema, not elsewhere classified: Secondary | ICD-10-CM | POA: Diagnosis not present

## 2023-07-10 DIAGNOSIS — I87321 Chronic venous hypertension (idiopathic) with inflammation of right lower extremity: Secondary | ICD-10-CM | POA: Diagnosis not present

## 2023-07-10 DIAGNOSIS — E559 Vitamin D deficiency, unspecified: Secondary | ICD-10-CM | POA: Diagnosis not present

## 2023-07-10 DIAGNOSIS — R600 Localized edema: Secondary | ICD-10-CM | POA: Diagnosis not present

## 2023-07-10 DIAGNOSIS — I482 Chronic atrial fibrillation, unspecified: Secondary | ICD-10-CM | POA: Diagnosis not present

## 2023-07-10 DIAGNOSIS — F32A Depression, unspecified: Secondary | ICD-10-CM | POA: Diagnosis not present

## 2023-07-10 DIAGNOSIS — E782 Mixed hyperlipidemia: Secondary | ICD-10-CM | POA: Diagnosis not present

## 2023-07-10 DIAGNOSIS — F4321 Adjustment disorder with depressed mood: Secondary | ICD-10-CM | POA: Diagnosis not present

## 2023-07-10 DIAGNOSIS — Z7901 Long term (current) use of anticoagulants: Secondary | ICD-10-CM | POA: Diagnosis not present

## 2023-07-10 DIAGNOSIS — I739 Peripheral vascular disease, unspecified: Secondary | ICD-10-CM | POA: Diagnosis not present

## 2023-07-10 DIAGNOSIS — F0394 Unspecified dementia, unspecified severity, with anxiety: Secondary | ICD-10-CM | POA: Diagnosis not present

## 2023-07-10 DIAGNOSIS — I890 Lymphedema, not elsewhere classified: Secondary | ICD-10-CM | POA: Diagnosis not present

## 2023-07-10 DIAGNOSIS — L97811 Non-pressure chronic ulcer of other part of right lower leg limited to breakdown of skin: Secondary | ICD-10-CM | POA: Diagnosis not present

## 2023-07-10 DIAGNOSIS — G8929 Other chronic pain: Secondary | ICD-10-CM | POA: Diagnosis not present

## 2023-07-10 DIAGNOSIS — F0393 Unspecified dementia, unspecified severity, with mood disturbance: Secondary | ICD-10-CM | POA: Diagnosis not present

## 2023-07-12 DIAGNOSIS — F0394 Unspecified dementia, unspecified severity, with anxiety: Secondary | ICD-10-CM | POA: Diagnosis not present

## 2023-07-12 DIAGNOSIS — I890 Lymphedema, not elsewhere classified: Secondary | ICD-10-CM | POA: Diagnosis not present

## 2023-07-12 DIAGNOSIS — R600 Localized edema: Secondary | ICD-10-CM | POA: Diagnosis not present

## 2023-07-12 DIAGNOSIS — F0393 Unspecified dementia, unspecified severity, with mood disturbance: Secondary | ICD-10-CM | POA: Diagnosis not present

## 2023-07-12 DIAGNOSIS — I87321 Chronic venous hypertension (idiopathic) with inflammation of right lower extremity: Secondary | ICD-10-CM | POA: Diagnosis not present

## 2023-07-12 DIAGNOSIS — Z7901 Long term (current) use of anticoagulants: Secondary | ICD-10-CM | POA: Diagnosis not present

## 2023-07-12 DIAGNOSIS — F4321 Adjustment disorder with depressed mood: Secondary | ICD-10-CM | POA: Diagnosis not present

## 2023-07-12 DIAGNOSIS — G8929 Other chronic pain: Secondary | ICD-10-CM | POA: Diagnosis not present

## 2023-07-12 DIAGNOSIS — F32A Depression, unspecified: Secondary | ICD-10-CM | POA: Diagnosis not present

## 2023-07-12 DIAGNOSIS — E782 Mixed hyperlipidemia: Secondary | ICD-10-CM | POA: Diagnosis not present

## 2023-07-12 DIAGNOSIS — E559 Vitamin D deficiency, unspecified: Secondary | ICD-10-CM | POA: Diagnosis not present

## 2023-07-12 DIAGNOSIS — I482 Chronic atrial fibrillation, unspecified: Secondary | ICD-10-CM | POA: Diagnosis not present

## 2023-07-12 DIAGNOSIS — I739 Peripheral vascular disease, unspecified: Secondary | ICD-10-CM | POA: Diagnosis not present

## 2023-07-12 DIAGNOSIS — L97811 Non-pressure chronic ulcer of other part of right lower leg limited to breakdown of skin: Secondary | ICD-10-CM | POA: Diagnosis not present

## 2023-07-13 DIAGNOSIS — I872 Venous insufficiency (chronic) (peripheral): Secondary | ICD-10-CM | POA: Diagnosis not present

## 2023-07-13 DIAGNOSIS — I739 Peripheral vascular disease, unspecified: Secondary | ICD-10-CM | POA: Diagnosis not present

## 2023-07-13 DIAGNOSIS — M79675 Pain in left toe(s): Secondary | ICD-10-CM | POA: Diagnosis not present

## 2023-07-13 DIAGNOSIS — I482 Chronic atrial fibrillation, unspecified: Secondary | ICD-10-CM | POA: Diagnosis not present

## 2023-07-13 DIAGNOSIS — B351 Tinea unguium: Secondary | ICD-10-CM | POA: Diagnosis not present

## 2023-07-13 DIAGNOSIS — E782 Mixed hyperlipidemia: Secondary | ICD-10-CM | POA: Diagnosis not present

## 2023-07-16 DIAGNOSIS — I4891 Unspecified atrial fibrillation: Secondary | ICD-10-CM | POA: Diagnosis not present

## 2023-07-16 DIAGNOSIS — R5383 Other fatigue: Secondary | ICD-10-CM | POA: Diagnosis not present

## 2023-07-16 DIAGNOSIS — Q791 Other congenital malformations of diaphragm: Secondary | ICD-10-CM | POA: Diagnosis not present

## 2023-07-16 DIAGNOSIS — I4510 Unspecified right bundle-branch block: Secondary | ICD-10-CM | POA: Diagnosis not present

## 2023-07-16 DIAGNOSIS — R9431 Abnormal electrocardiogram [ECG] [EKG]: Secondary | ICD-10-CM | POA: Diagnosis not present

## 2023-07-17 DIAGNOSIS — I482 Chronic atrial fibrillation, unspecified: Secondary | ICD-10-CM | POA: Diagnosis not present

## 2023-07-17 DIAGNOSIS — I739 Peripheral vascular disease, unspecified: Secondary | ICD-10-CM | POA: Diagnosis not present

## 2023-07-17 DIAGNOSIS — R5382 Chronic fatigue, unspecified: Secondary | ICD-10-CM | POA: Diagnosis not present

## 2023-07-17 DIAGNOSIS — F331 Major depressive disorder, recurrent, moderate: Secondary | ICD-10-CM | POA: Diagnosis not present

## 2023-07-18 DIAGNOSIS — G8929 Other chronic pain: Secondary | ICD-10-CM | POA: Diagnosis not present

## 2023-07-18 DIAGNOSIS — E559 Vitamin D deficiency, unspecified: Secondary | ICD-10-CM | POA: Diagnosis not present

## 2023-07-18 DIAGNOSIS — Z7901 Long term (current) use of anticoagulants: Secondary | ICD-10-CM | POA: Diagnosis not present

## 2023-07-18 DIAGNOSIS — F4321 Adjustment disorder with depressed mood: Secondary | ICD-10-CM | POA: Diagnosis not present

## 2023-07-18 DIAGNOSIS — L97811 Non-pressure chronic ulcer of other part of right lower leg limited to breakdown of skin: Secondary | ICD-10-CM | POA: Diagnosis not present

## 2023-07-18 DIAGNOSIS — I739 Peripheral vascular disease, unspecified: Secondary | ICD-10-CM | POA: Diagnosis not present

## 2023-07-18 DIAGNOSIS — F32A Depression, unspecified: Secondary | ICD-10-CM | POA: Diagnosis not present

## 2023-07-18 DIAGNOSIS — F0393 Unspecified dementia, unspecified severity, with mood disturbance: Secondary | ICD-10-CM | POA: Diagnosis not present

## 2023-07-18 DIAGNOSIS — I87321 Chronic venous hypertension (idiopathic) with inflammation of right lower extremity: Secondary | ICD-10-CM | POA: Diagnosis not present

## 2023-07-18 DIAGNOSIS — R600 Localized edema: Secondary | ICD-10-CM | POA: Diagnosis not present

## 2023-07-18 DIAGNOSIS — F0394 Unspecified dementia, unspecified severity, with anxiety: Secondary | ICD-10-CM | POA: Diagnosis not present

## 2023-07-18 DIAGNOSIS — I890 Lymphedema, not elsewhere classified: Secondary | ICD-10-CM | POA: Diagnosis not present

## 2023-07-18 DIAGNOSIS — E782 Mixed hyperlipidemia: Secondary | ICD-10-CM | POA: Diagnosis not present

## 2023-07-18 DIAGNOSIS — I482 Chronic atrial fibrillation, unspecified: Secondary | ICD-10-CM | POA: Diagnosis not present

## 2023-07-23 DIAGNOSIS — I890 Lymphedema, not elsewhere classified: Secondary | ICD-10-CM | POA: Diagnosis not present

## 2023-07-25 DIAGNOSIS — F0394 Unspecified dementia, unspecified severity, with anxiety: Secondary | ICD-10-CM | POA: Diagnosis not present

## 2023-07-25 DIAGNOSIS — F0393 Unspecified dementia, unspecified severity, with mood disturbance: Secondary | ICD-10-CM | POA: Diagnosis not present

## 2023-07-25 DIAGNOSIS — F32A Depression, unspecified: Secondary | ICD-10-CM | POA: Diagnosis not present

## 2023-07-25 DIAGNOSIS — R600 Localized edema: Secondary | ICD-10-CM | POA: Diagnosis not present

## 2023-07-25 DIAGNOSIS — I739 Peripheral vascular disease, unspecified: Secondary | ICD-10-CM | POA: Diagnosis not present

## 2023-07-25 DIAGNOSIS — L97811 Non-pressure chronic ulcer of other part of right lower leg limited to breakdown of skin: Secondary | ICD-10-CM | POA: Diagnosis not present

## 2023-07-25 DIAGNOSIS — E782 Mixed hyperlipidemia: Secondary | ICD-10-CM | POA: Diagnosis not present

## 2023-07-25 DIAGNOSIS — Z7901 Long term (current) use of anticoagulants: Secondary | ICD-10-CM | POA: Diagnosis not present

## 2023-07-25 DIAGNOSIS — I482 Chronic atrial fibrillation, unspecified: Secondary | ICD-10-CM | POA: Diagnosis not present

## 2023-07-25 DIAGNOSIS — I890 Lymphedema, not elsewhere classified: Secondary | ICD-10-CM | POA: Diagnosis not present

## 2023-07-25 DIAGNOSIS — E559 Vitamin D deficiency, unspecified: Secondary | ICD-10-CM | POA: Diagnosis not present

## 2023-07-25 DIAGNOSIS — F4321 Adjustment disorder with depressed mood: Secondary | ICD-10-CM | POA: Diagnosis not present

## 2023-07-25 DIAGNOSIS — G8929 Other chronic pain: Secondary | ICD-10-CM | POA: Diagnosis not present

## 2023-07-25 DIAGNOSIS — I872 Venous insufficiency (chronic) (peripheral): Secondary | ICD-10-CM | POA: Diagnosis not present

## 2023-07-25 DIAGNOSIS — I87321 Chronic venous hypertension (idiopathic) with inflammation of right lower extremity: Secondary | ICD-10-CM | POA: Diagnosis not present

## 2023-07-26 ENCOUNTER — Encounter (HOSPITAL_BASED_OUTPATIENT_CLINIC_OR_DEPARTMENT_OTHER): Payer: Medicare Other | Attending: Internal Medicine | Admitting: Internal Medicine

## 2023-07-26 DIAGNOSIS — L97812 Non-pressure chronic ulcer of other part of right lower leg with fat layer exposed: Secondary | ICD-10-CM | POA: Diagnosis not present

## 2023-07-26 DIAGNOSIS — I890 Lymphedema, not elsewhere classified: Secondary | ICD-10-CM | POA: Insufficient documentation

## 2023-07-26 DIAGNOSIS — I87313 Chronic venous hypertension (idiopathic) with ulcer of bilateral lower extremity: Secondary | ICD-10-CM | POA: Insufficient documentation

## 2023-07-26 DIAGNOSIS — L97822 Non-pressure chronic ulcer of other part of left lower leg with fat layer exposed: Secondary | ICD-10-CM | POA: Diagnosis not present

## 2023-07-26 DIAGNOSIS — F0390 Unspecified dementia without behavioral disturbance: Secondary | ICD-10-CM | POA: Diagnosis not present

## 2023-07-30 DIAGNOSIS — I890 Lymphedema, not elsewhere classified: Secondary | ICD-10-CM | POA: Diagnosis not present

## 2023-08-01 ENCOUNTER — Encounter (HOSPITAL_BASED_OUTPATIENT_CLINIC_OR_DEPARTMENT_OTHER): Attending: Internal Medicine | Admitting: Internal Medicine

## 2023-08-01 DIAGNOSIS — L97822 Non-pressure chronic ulcer of other part of left lower leg with fat layer exposed: Secondary | ICD-10-CM | POA: Diagnosis not present

## 2023-08-01 DIAGNOSIS — I87313 Chronic venous hypertension (idiopathic) with ulcer of bilateral lower extremity: Secondary | ICD-10-CM | POA: Insufficient documentation

## 2023-08-01 DIAGNOSIS — I890 Lymphedema, not elsewhere classified: Secondary | ICD-10-CM | POA: Insufficient documentation

## 2023-08-01 DIAGNOSIS — I87311 Chronic venous hypertension (idiopathic) with ulcer of right lower extremity: Secondary | ICD-10-CM | POA: Diagnosis not present

## 2023-08-01 DIAGNOSIS — F0390 Unspecified dementia without behavioral disturbance: Secondary | ICD-10-CM | POA: Diagnosis not present

## 2023-08-01 DIAGNOSIS — L97812 Non-pressure chronic ulcer of other part of right lower leg with fat layer exposed: Secondary | ICD-10-CM | POA: Diagnosis not present

## 2023-08-01 DIAGNOSIS — I87312 Chronic venous hypertension (idiopathic) with ulcer of left lower extremity: Secondary | ICD-10-CM

## 2023-08-06 DIAGNOSIS — F0393 Unspecified dementia, unspecified severity, with mood disturbance: Secondary | ICD-10-CM | POA: Diagnosis not present

## 2023-08-06 DIAGNOSIS — F32A Depression, unspecified: Secondary | ICD-10-CM | POA: Diagnosis not present

## 2023-08-06 DIAGNOSIS — F331 Major depressive disorder, recurrent, moderate: Secondary | ICD-10-CM | POA: Diagnosis not present

## 2023-08-06 DIAGNOSIS — Z7901 Long term (current) use of anticoagulants: Secondary | ICD-10-CM | POA: Diagnosis not present

## 2023-08-06 DIAGNOSIS — E559 Vitamin D deficiency, unspecified: Secondary | ICD-10-CM | POA: Diagnosis not present

## 2023-08-06 DIAGNOSIS — I739 Peripheral vascular disease, unspecified: Secondary | ICD-10-CM | POA: Diagnosis not present

## 2023-08-06 DIAGNOSIS — I87321 Chronic venous hypertension (idiopathic) with inflammation of right lower extremity: Secondary | ICD-10-CM | POA: Diagnosis not present

## 2023-08-06 DIAGNOSIS — F411 Generalized anxiety disorder: Secondary | ICD-10-CM | POA: Diagnosis not present

## 2023-08-06 DIAGNOSIS — G8929 Other chronic pain: Secondary | ICD-10-CM | POA: Diagnosis not present

## 2023-08-06 DIAGNOSIS — I890 Lymphedema, not elsewhere classified: Secondary | ICD-10-CM | POA: Diagnosis not present

## 2023-08-06 DIAGNOSIS — E782 Mixed hyperlipidemia: Secondary | ICD-10-CM | POA: Diagnosis not present

## 2023-08-06 DIAGNOSIS — F4321 Adjustment disorder with depressed mood: Secondary | ICD-10-CM | POA: Diagnosis not present

## 2023-08-06 DIAGNOSIS — G300 Alzheimer's disease with early onset: Secondary | ICD-10-CM | POA: Diagnosis not present

## 2023-08-06 DIAGNOSIS — L97811 Non-pressure chronic ulcer of other part of right lower leg limited to breakdown of skin: Secondary | ICD-10-CM | POA: Diagnosis not present

## 2023-08-06 DIAGNOSIS — R600 Localized edema: Secondary | ICD-10-CM | POA: Diagnosis not present

## 2023-08-06 DIAGNOSIS — I482 Chronic atrial fibrillation, unspecified: Secondary | ICD-10-CM | POA: Diagnosis not present

## 2023-08-06 DIAGNOSIS — F0394 Unspecified dementia, unspecified severity, with anxiety: Secondary | ICD-10-CM | POA: Diagnosis not present

## 2023-08-08 DIAGNOSIS — L97811 Non-pressure chronic ulcer of other part of right lower leg limited to breakdown of skin: Secondary | ICD-10-CM | POA: Diagnosis not present

## 2023-08-08 DIAGNOSIS — E782 Mixed hyperlipidemia: Secondary | ICD-10-CM | POA: Diagnosis not present

## 2023-08-08 DIAGNOSIS — G8929 Other chronic pain: Secondary | ICD-10-CM | POA: Diagnosis not present

## 2023-08-08 DIAGNOSIS — I87321 Chronic venous hypertension (idiopathic) with inflammation of right lower extremity: Secondary | ICD-10-CM | POA: Diagnosis not present

## 2023-08-08 DIAGNOSIS — F4321 Adjustment disorder with depressed mood: Secondary | ICD-10-CM | POA: Diagnosis not present

## 2023-08-08 DIAGNOSIS — R600 Localized edema: Secondary | ICD-10-CM | POA: Diagnosis not present

## 2023-08-08 DIAGNOSIS — I890 Lymphedema, not elsewhere classified: Secondary | ICD-10-CM | POA: Diagnosis not present

## 2023-08-08 DIAGNOSIS — F0394 Unspecified dementia, unspecified severity, with anxiety: Secondary | ICD-10-CM | POA: Diagnosis not present

## 2023-08-08 DIAGNOSIS — I739 Peripheral vascular disease, unspecified: Secondary | ICD-10-CM | POA: Diagnosis not present

## 2023-08-08 DIAGNOSIS — F0393 Unspecified dementia, unspecified severity, with mood disturbance: Secondary | ICD-10-CM | POA: Diagnosis not present

## 2023-08-08 DIAGNOSIS — Z7901 Long term (current) use of anticoagulants: Secondary | ICD-10-CM | POA: Diagnosis not present

## 2023-08-08 DIAGNOSIS — E559 Vitamin D deficiency, unspecified: Secondary | ICD-10-CM | POA: Diagnosis not present

## 2023-08-08 DIAGNOSIS — F32A Depression, unspecified: Secondary | ICD-10-CM | POA: Diagnosis not present

## 2023-08-08 DIAGNOSIS — I482 Chronic atrial fibrillation, unspecified: Secondary | ICD-10-CM | POA: Diagnosis not present

## 2023-08-09 DIAGNOSIS — F0394 Unspecified dementia, unspecified severity, with anxiety: Secondary | ICD-10-CM | POA: Diagnosis not present

## 2023-08-09 DIAGNOSIS — L97811 Non-pressure chronic ulcer of other part of right lower leg limited to breakdown of skin: Secondary | ICD-10-CM | POA: Diagnosis not present

## 2023-08-09 DIAGNOSIS — I890 Lymphedema, not elsewhere classified: Secondary | ICD-10-CM | POA: Diagnosis not present

## 2023-08-09 DIAGNOSIS — R600 Localized edema: Secondary | ICD-10-CM | POA: Diagnosis not present

## 2023-08-09 DIAGNOSIS — I482 Chronic atrial fibrillation, unspecified: Secondary | ICD-10-CM | POA: Diagnosis not present

## 2023-08-09 DIAGNOSIS — F0393 Unspecified dementia, unspecified severity, with mood disturbance: Secondary | ICD-10-CM | POA: Diagnosis not present

## 2023-08-09 DIAGNOSIS — G8929 Other chronic pain: Secondary | ICD-10-CM | POA: Diagnosis not present

## 2023-08-09 DIAGNOSIS — E559 Vitamin D deficiency, unspecified: Secondary | ICD-10-CM | POA: Diagnosis not present

## 2023-08-09 DIAGNOSIS — F4321 Adjustment disorder with depressed mood: Secondary | ICD-10-CM | POA: Diagnosis not present

## 2023-08-09 DIAGNOSIS — I739 Peripheral vascular disease, unspecified: Secondary | ICD-10-CM | POA: Diagnosis not present

## 2023-08-09 DIAGNOSIS — Z7901 Long term (current) use of anticoagulants: Secondary | ICD-10-CM | POA: Diagnosis not present

## 2023-08-09 DIAGNOSIS — E782 Mixed hyperlipidemia: Secondary | ICD-10-CM | POA: Diagnosis not present

## 2023-08-09 DIAGNOSIS — I87321 Chronic venous hypertension (idiopathic) with inflammation of right lower extremity: Secondary | ICD-10-CM | POA: Diagnosis not present

## 2023-08-09 DIAGNOSIS — F32A Depression, unspecified: Secondary | ICD-10-CM | POA: Diagnosis not present

## 2023-08-10 ENCOUNTER — Encounter (HOSPITAL_BASED_OUTPATIENT_CLINIC_OR_DEPARTMENT_OTHER): Admitting: Internal Medicine

## 2023-08-10 DIAGNOSIS — I87321 Chronic venous hypertension (idiopathic) with inflammation of right lower extremity: Secondary | ICD-10-CM | POA: Diagnosis not present

## 2023-08-10 DIAGNOSIS — I87311 Chronic venous hypertension (idiopathic) with ulcer of right lower extremity: Secondary | ICD-10-CM | POA: Diagnosis not present

## 2023-08-10 DIAGNOSIS — I87312 Chronic venous hypertension (idiopathic) with ulcer of left lower extremity: Secondary | ICD-10-CM

## 2023-08-10 DIAGNOSIS — L97822 Non-pressure chronic ulcer of other part of left lower leg with fat layer exposed: Secondary | ICD-10-CM

## 2023-08-10 DIAGNOSIS — E559 Vitamin D deficiency, unspecified: Secondary | ICD-10-CM | POA: Diagnosis not present

## 2023-08-10 DIAGNOSIS — L97811 Non-pressure chronic ulcer of other part of right lower leg limited to breakdown of skin: Secondary | ICD-10-CM | POA: Diagnosis not present

## 2023-08-10 DIAGNOSIS — I482 Chronic atrial fibrillation, unspecified: Secondary | ICD-10-CM | POA: Diagnosis not present

## 2023-08-10 DIAGNOSIS — L97812 Non-pressure chronic ulcer of other part of right lower leg with fat layer exposed: Secondary | ICD-10-CM

## 2023-08-10 DIAGNOSIS — F32A Depression, unspecified: Secondary | ICD-10-CM | POA: Diagnosis not present

## 2023-08-10 DIAGNOSIS — I890 Lymphedema, not elsewhere classified: Secondary | ICD-10-CM | POA: Diagnosis not present

## 2023-08-10 DIAGNOSIS — G8929 Other chronic pain: Secondary | ICD-10-CM | POA: Diagnosis not present

## 2023-08-10 DIAGNOSIS — I739 Peripheral vascular disease, unspecified: Secondary | ICD-10-CM | POA: Diagnosis not present

## 2023-08-10 DIAGNOSIS — E782 Mixed hyperlipidemia: Secondary | ICD-10-CM | POA: Diagnosis not present

## 2023-08-10 DIAGNOSIS — R600 Localized edema: Secondary | ICD-10-CM | POA: Diagnosis not present

## 2023-08-10 DIAGNOSIS — Z7901 Long term (current) use of anticoagulants: Secondary | ICD-10-CM | POA: Diagnosis not present

## 2023-08-10 DIAGNOSIS — F0393 Unspecified dementia, unspecified severity, with mood disturbance: Secondary | ICD-10-CM | POA: Diagnosis not present

## 2023-08-10 DIAGNOSIS — F0390 Unspecified dementia without behavioral disturbance: Secondary | ICD-10-CM | POA: Diagnosis not present

## 2023-08-10 DIAGNOSIS — F4321 Adjustment disorder with depressed mood: Secondary | ICD-10-CM | POA: Diagnosis not present

## 2023-08-10 DIAGNOSIS — F0394 Unspecified dementia, unspecified severity, with anxiety: Secondary | ICD-10-CM | POA: Diagnosis not present

## 2023-08-10 DIAGNOSIS — I87313 Chronic venous hypertension (idiopathic) with ulcer of bilateral lower extremity: Secondary | ICD-10-CM | POA: Diagnosis not present

## 2023-08-13 DIAGNOSIS — I890 Lymphedema, not elsewhere classified: Secondary | ICD-10-CM | POA: Diagnosis not present

## 2023-08-14 DIAGNOSIS — I482 Chronic atrial fibrillation, unspecified: Secondary | ICD-10-CM | POA: Diagnosis not present

## 2023-08-14 DIAGNOSIS — I890 Lymphedema, not elsewhere classified: Secondary | ICD-10-CM | POA: Diagnosis not present

## 2023-08-14 DIAGNOSIS — Z7901 Long term (current) use of anticoagulants: Secondary | ICD-10-CM | POA: Diagnosis not present

## 2023-08-14 DIAGNOSIS — L97811 Non-pressure chronic ulcer of other part of right lower leg limited to breakdown of skin: Secondary | ICD-10-CM | POA: Diagnosis not present

## 2023-08-14 DIAGNOSIS — F0393 Unspecified dementia, unspecified severity, with mood disturbance: Secondary | ICD-10-CM | POA: Diagnosis not present

## 2023-08-14 DIAGNOSIS — I87321 Chronic venous hypertension (idiopathic) with inflammation of right lower extremity: Secondary | ICD-10-CM | POA: Diagnosis not present

## 2023-08-14 DIAGNOSIS — R600 Localized edema: Secondary | ICD-10-CM | POA: Diagnosis not present

## 2023-08-14 DIAGNOSIS — E782 Mixed hyperlipidemia: Secondary | ICD-10-CM | POA: Diagnosis not present

## 2023-08-14 DIAGNOSIS — F4321 Adjustment disorder with depressed mood: Secondary | ICD-10-CM | POA: Diagnosis not present

## 2023-08-14 DIAGNOSIS — F0394 Unspecified dementia, unspecified severity, with anxiety: Secondary | ICD-10-CM | POA: Diagnosis not present

## 2023-08-14 DIAGNOSIS — F32A Depression, unspecified: Secondary | ICD-10-CM | POA: Diagnosis not present

## 2023-08-14 DIAGNOSIS — E559 Vitamin D deficiency, unspecified: Secondary | ICD-10-CM | POA: Diagnosis not present

## 2023-08-14 DIAGNOSIS — G8929 Other chronic pain: Secondary | ICD-10-CM | POA: Diagnosis not present

## 2023-08-14 DIAGNOSIS — I739 Peripheral vascular disease, unspecified: Secondary | ICD-10-CM | POA: Diagnosis not present

## 2023-08-16 DIAGNOSIS — F32A Depression, unspecified: Secondary | ICD-10-CM | POA: Diagnosis not present

## 2023-08-16 DIAGNOSIS — F0393 Unspecified dementia, unspecified severity, with mood disturbance: Secondary | ICD-10-CM | POA: Diagnosis not present

## 2023-08-16 DIAGNOSIS — F4321 Adjustment disorder with depressed mood: Secondary | ICD-10-CM | POA: Diagnosis not present

## 2023-08-16 DIAGNOSIS — E782 Mixed hyperlipidemia: Secondary | ICD-10-CM | POA: Diagnosis not present

## 2023-08-16 DIAGNOSIS — R600 Localized edema: Secondary | ICD-10-CM | POA: Diagnosis not present

## 2023-08-16 DIAGNOSIS — L97811 Non-pressure chronic ulcer of other part of right lower leg limited to breakdown of skin: Secondary | ICD-10-CM | POA: Diagnosis not present

## 2023-08-16 DIAGNOSIS — F0394 Unspecified dementia, unspecified severity, with anxiety: Secondary | ICD-10-CM | POA: Diagnosis not present

## 2023-08-16 DIAGNOSIS — I890 Lymphedema, not elsewhere classified: Secondary | ICD-10-CM | POA: Diagnosis not present

## 2023-08-16 DIAGNOSIS — Z7901 Long term (current) use of anticoagulants: Secondary | ICD-10-CM | POA: Diagnosis not present

## 2023-08-16 DIAGNOSIS — I739 Peripheral vascular disease, unspecified: Secondary | ICD-10-CM | POA: Diagnosis not present

## 2023-08-16 DIAGNOSIS — G8929 Other chronic pain: Secondary | ICD-10-CM | POA: Diagnosis not present

## 2023-08-16 DIAGNOSIS — I482 Chronic atrial fibrillation, unspecified: Secondary | ICD-10-CM | POA: Diagnosis not present

## 2023-08-16 DIAGNOSIS — E559 Vitamin D deficiency, unspecified: Secondary | ICD-10-CM | POA: Diagnosis not present

## 2023-08-16 DIAGNOSIS — I87321 Chronic venous hypertension (idiopathic) with inflammation of right lower extremity: Secondary | ICD-10-CM | POA: Diagnosis not present

## 2023-08-20 DIAGNOSIS — L97811 Non-pressure chronic ulcer of other part of right lower leg limited to breakdown of skin: Secondary | ICD-10-CM | POA: Diagnosis not present

## 2023-08-20 DIAGNOSIS — F4321 Adjustment disorder with depressed mood: Secondary | ICD-10-CM | POA: Diagnosis not present

## 2023-08-20 DIAGNOSIS — E559 Vitamin D deficiency, unspecified: Secondary | ICD-10-CM | POA: Diagnosis not present

## 2023-08-20 DIAGNOSIS — I890 Lymphedema, not elsewhere classified: Secondary | ICD-10-CM | POA: Diagnosis not present

## 2023-08-20 DIAGNOSIS — F0393 Unspecified dementia, unspecified severity, with mood disturbance: Secondary | ICD-10-CM | POA: Diagnosis not present

## 2023-08-20 DIAGNOSIS — I87321 Chronic venous hypertension (idiopathic) with inflammation of right lower extremity: Secondary | ICD-10-CM | POA: Diagnosis not present

## 2023-08-20 DIAGNOSIS — I739 Peripheral vascular disease, unspecified: Secondary | ICD-10-CM | POA: Diagnosis not present

## 2023-08-20 DIAGNOSIS — E782 Mixed hyperlipidemia: Secondary | ICD-10-CM | POA: Diagnosis not present

## 2023-08-20 DIAGNOSIS — I482 Chronic atrial fibrillation, unspecified: Secondary | ICD-10-CM | POA: Diagnosis not present

## 2023-08-20 DIAGNOSIS — Z7901 Long term (current) use of anticoagulants: Secondary | ICD-10-CM | POA: Diagnosis not present

## 2023-08-20 DIAGNOSIS — F0394 Unspecified dementia, unspecified severity, with anxiety: Secondary | ICD-10-CM | POA: Diagnosis not present

## 2023-08-20 DIAGNOSIS — F32A Depression, unspecified: Secondary | ICD-10-CM | POA: Diagnosis not present

## 2023-08-20 DIAGNOSIS — R600 Localized edema: Secondary | ICD-10-CM | POA: Diagnosis not present

## 2023-08-20 DIAGNOSIS — G8929 Other chronic pain: Secondary | ICD-10-CM | POA: Diagnosis not present

## 2023-08-21 ENCOUNTER — Encounter (HOSPITAL_BASED_OUTPATIENT_CLINIC_OR_DEPARTMENT_OTHER): Admitting: Internal Medicine

## 2023-08-21 DIAGNOSIS — I87313 Chronic venous hypertension (idiopathic) with ulcer of bilateral lower extremity: Secondary | ICD-10-CM | POA: Diagnosis not present

## 2023-08-21 DIAGNOSIS — I890 Lymphedema, not elsewhere classified: Secondary | ICD-10-CM | POA: Diagnosis not present

## 2023-08-21 DIAGNOSIS — L97812 Non-pressure chronic ulcer of other part of right lower leg with fat layer exposed: Secondary | ICD-10-CM | POA: Diagnosis not present

## 2023-08-21 DIAGNOSIS — I87312 Chronic venous hypertension (idiopathic) with ulcer of left lower extremity: Secondary | ICD-10-CM

## 2023-08-21 DIAGNOSIS — F0390 Unspecified dementia without behavioral disturbance: Secondary | ICD-10-CM | POA: Diagnosis not present

## 2023-08-21 DIAGNOSIS — L97822 Non-pressure chronic ulcer of other part of left lower leg with fat layer exposed: Secondary | ICD-10-CM

## 2023-08-21 DIAGNOSIS — I87311 Chronic venous hypertension (idiopathic) with ulcer of right lower extremity: Secondary | ICD-10-CM

## 2023-08-24 DIAGNOSIS — L97811 Non-pressure chronic ulcer of other part of right lower leg limited to breakdown of skin: Secondary | ICD-10-CM | POA: Diagnosis not present

## 2023-08-24 DIAGNOSIS — F32A Depression, unspecified: Secondary | ICD-10-CM | POA: Diagnosis not present

## 2023-08-24 DIAGNOSIS — E782 Mixed hyperlipidemia: Secondary | ICD-10-CM | POA: Diagnosis not present

## 2023-08-24 DIAGNOSIS — F0393 Unspecified dementia, unspecified severity, with mood disturbance: Secondary | ICD-10-CM | POA: Diagnosis not present

## 2023-08-24 DIAGNOSIS — I890 Lymphedema, not elsewhere classified: Secondary | ICD-10-CM | POA: Diagnosis not present

## 2023-08-24 DIAGNOSIS — I739 Peripheral vascular disease, unspecified: Secondary | ICD-10-CM | POA: Diagnosis not present

## 2023-08-24 DIAGNOSIS — F0394 Unspecified dementia, unspecified severity, with anxiety: Secondary | ICD-10-CM | POA: Diagnosis not present

## 2023-08-24 DIAGNOSIS — I482 Chronic atrial fibrillation, unspecified: Secondary | ICD-10-CM | POA: Diagnosis not present

## 2023-08-24 DIAGNOSIS — R600 Localized edema: Secondary | ICD-10-CM | POA: Diagnosis not present

## 2023-08-24 DIAGNOSIS — E559 Vitamin D deficiency, unspecified: Secondary | ICD-10-CM | POA: Diagnosis not present

## 2023-08-24 DIAGNOSIS — I87321 Chronic venous hypertension (idiopathic) with inflammation of right lower extremity: Secondary | ICD-10-CM | POA: Diagnosis not present

## 2023-08-24 DIAGNOSIS — G8929 Other chronic pain: Secondary | ICD-10-CM | POA: Diagnosis not present

## 2023-08-24 DIAGNOSIS — F4321 Adjustment disorder with depressed mood: Secondary | ICD-10-CM | POA: Diagnosis not present

## 2023-08-24 DIAGNOSIS — Z7901 Long term (current) use of anticoagulants: Secondary | ICD-10-CM | POA: Diagnosis not present

## 2023-08-27 ENCOUNTER — Ambulatory Visit (HOSPITAL_BASED_OUTPATIENT_CLINIC_OR_DEPARTMENT_OTHER): Admitting: Internal Medicine

## 2023-08-27 DIAGNOSIS — F0394 Unspecified dementia, unspecified severity, with anxiety: Secondary | ICD-10-CM | POA: Diagnosis not present

## 2023-08-27 DIAGNOSIS — I890 Lymphedema, not elsewhere classified: Secondary | ICD-10-CM | POA: Diagnosis not present

## 2023-08-27 DIAGNOSIS — G8929 Other chronic pain: Secondary | ICD-10-CM | POA: Diagnosis not present

## 2023-08-27 DIAGNOSIS — F32A Depression, unspecified: Secondary | ICD-10-CM | POA: Diagnosis not present

## 2023-08-27 DIAGNOSIS — L97811 Non-pressure chronic ulcer of other part of right lower leg limited to breakdown of skin: Secondary | ICD-10-CM | POA: Diagnosis not present

## 2023-08-27 DIAGNOSIS — R600 Localized edema: Secondary | ICD-10-CM | POA: Diagnosis not present

## 2023-08-27 DIAGNOSIS — E782 Mixed hyperlipidemia: Secondary | ICD-10-CM | POA: Diagnosis not present

## 2023-08-27 DIAGNOSIS — I87321 Chronic venous hypertension (idiopathic) with inflammation of right lower extremity: Secondary | ICD-10-CM | POA: Diagnosis not present

## 2023-08-27 DIAGNOSIS — I482 Chronic atrial fibrillation, unspecified: Secondary | ICD-10-CM | POA: Diagnosis not present

## 2023-08-27 DIAGNOSIS — Z7901 Long term (current) use of anticoagulants: Secondary | ICD-10-CM | POA: Diagnosis not present

## 2023-08-27 DIAGNOSIS — F0393 Unspecified dementia, unspecified severity, with mood disturbance: Secondary | ICD-10-CM | POA: Diagnosis not present

## 2023-08-27 DIAGNOSIS — E559 Vitamin D deficiency, unspecified: Secondary | ICD-10-CM | POA: Diagnosis not present

## 2023-08-27 DIAGNOSIS — F4321 Adjustment disorder with depressed mood: Secondary | ICD-10-CM | POA: Diagnosis not present

## 2023-08-27 DIAGNOSIS — I739 Peripheral vascular disease, unspecified: Secondary | ICD-10-CM | POA: Diagnosis not present

## 2023-08-28 DIAGNOSIS — G301 Alzheimer's disease with late onset: Secondary | ICD-10-CM | POA: Diagnosis not present

## 2023-08-28 DIAGNOSIS — F321 Major depressive disorder, single episode, moderate: Secondary | ICD-10-CM | POA: Diagnosis not present

## 2023-08-29 DIAGNOSIS — E782 Mixed hyperlipidemia: Secondary | ICD-10-CM | POA: Diagnosis not present

## 2023-08-29 DIAGNOSIS — I482 Chronic atrial fibrillation, unspecified: Secondary | ICD-10-CM | POA: Diagnosis not present

## 2023-08-29 DIAGNOSIS — G8929 Other chronic pain: Secondary | ICD-10-CM | POA: Diagnosis not present

## 2023-08-29 DIAGNOSIS — L97811 Non-pressure chronic ulcer of other part of right lower leg limited to breakdown of skin: Secondary | ICD-10-CM | POA: Diagnosis not present

## 2023-08-29 DIAGNOSIS — E559 Vitamin D deficiency, unspecified: Secondary | ICD-10-CM | POA: Diagnosis not present

## 2023-08-29 DIAGNOSIS — I739 Peripheral vascular disease, unspecified: Secondary | ICD-10-CM | POA: Diagnosis not present

## 2023-08-29 DIAGNOSIS — F32A Depression, unspecified: Secondary | ICD-10-CM | POA: Diagnosis not present

## 2023-08-29 DIAGNOSIS — I890 Lymphedema, not elsewhere classified: Secondary | ICD-10-CM | POA: Diagnosis not present

## 2023-08-29 DIAGNOSIS — F0393 Unspecified dementia, unspecified severity, with mood disturbance: Secondary | ICD-10-CM | POA: Diagnosis not present

## 2023-08-29 DIAGNOSIS — R600 Localized edema: Secondary | ICD-10-CM | POA: Diagnosis not present

## 2023-08-29 DIAGNOSIS — F0394 Unspecified dementia, unspecified severity, with anxiety: Secondary | ICD-10-CM | POA: Diagnosis not present

## 2023-08-29 DIAGNOSIS — I87321 Chronic venous hypertension (idiopathic) with inflammation of right lower extremity: Secondary | ICD-10-CM | POA: Diagnosis not present

## 2023-08-29 DIAGNOSIS — Z7901 Long term (current) use of anticoagulants: Secondary | ICD-10-CM | POA: Diagnosis not present

## 2023-08-29 DIAGNOSIS — F4321 Adjustment disorder with depressed mood: Secondary | ICD-10-CM | POA: Diagnosis not present

## 2023-08-31 DIAGNOSIS — F0393 Unspecified dementia, unspecified severity, with mood disturbance: Secondary | ICD-10-CM | POA: Diagnosis not present

## 2023-08-31 DIAGNOSIS — I87321 Chronic venous hypertension (idiopathic) with inflammation of right lower extremity: Secondary | ICD-10-CM | POA: Diagnosis not present

## 2023-08-31 DIAGNOSIS — E782 Mixed hyperlipidemia: Secondary | ICD-10-CM | POA: Diagnosis not present

## 2023-08-31 DIAGNOSIS — I739 Peripheral vascular disease, unspecified: Secondary | ICD-10-CM | POA: Diagnosis not present

## 2023-08-31 DIAGNOSIS — F32A Depression, unspecified: Secondary | ICD-10-CM | POA: Diagnosis not present

## 2023-08-31 DIAGNOSIS — I482 Chronic atrial fibrillation, unspecified: Secondary | ICD-10-CM | POA: Diagnosis not present

## 2023-08-31 DIAGNOSIS — Z7901 Long term (current) use of anticoagulants: Secondary | ICD-10-CM | POA: Diagnosis not present

## 2023-08-31 DIAGNOSIS — E559 Vitamin D deficiency, unspecified: Secondary | ICD-10-CM | POA: Diagnosis not present

## 2023-08-31 DIAGNOSIS — F4321 Adjustment disorder with depressed mood: Secondary | ICD-10-CM | POA: Diagnosis not present

## 2023-08-31 DIAGNOSIS — G8929 Other chronic pain: Secondary | ICD-10-CM | POA: Diagnosis not present

## 2023-08-31 DIAGNOSIS — L97811 Non-pressure chronic ulcer of other part of right lower leg limited to breakdown of skin: Secondary | ICD-10-CM | POA: Diagnosis not present

## 2023-08-31 DIAGNOSIS — I890 Lymphedema, not elsewhere classified: Secondary | ICD-10-CM | POA: Diagnosis not present

## 2023-08-31 DIAGNOSIS — R600 Localized edema: Secondary | ICD-10-CM | POA: Diagnosis not present

## 2023-08-31 DIAGNOSIS — F0394 Unspecified dementia, unspecified severity, with anxiety: Secondary | ICD-10-CM | POA: Diagnosis not present

## 2023-09-03 DIAGNOSIS — L97811 Non-pressure chronic ulcer of other part of right lower leg limited to breakdown of skin: Secondary | ICD-10-CM | POA: Diagnosis not present

## 2023-09-03 DIAGNOSIS — G8929 Other chronic pain: Secondary | ICD-10-CM | POA: Diagnosis not present

## 2023-09-03 DIAGNOSIS — R600 Localized edema: Secondary | ICD-10-CM | POA: Diagnosis not present

## 2023-09-03 DIAGNOSIS — F32A Depression, unspecified: Secondary | ICD-10-CM | POA: Diagnosis not present

## 2023-09-03 DIAGNOSIS — F0393 Unspecified dementia, unspecified severity, with mood disturbance: Secondary | ICD-10-CM | POA: Diagnosis not present

## 2023-09-03 DIAGNOSIS — Z7901 Long term (current) use of anticoagulants: Secondary | ICD-10-CM | POA: Diagnosis not present

## 2023-09-03 DIAGNOSIS — E559 Vitamin D deficiency, unspecified: Secondary | ICD-10-CM | POA: Diagnosis not present

## 2023-09-03 DIAGNOSIS — F4321 Adjustment disorder with depressed mood: Secondary | ICD-10-CM | POA: Diagnosis not present

## 2023-09-03 DIAGNOSIS — I482 Chronic atrial fibrillation, unspecified: Secondary | ICD-10-CM | POA: Diagnosis not present

## 2023-09-03 DIAGNOSIS — I87321 Chronic venous hypertension (idiopathic) with inflammation of right lower extremity: Secondary | ICD-10-CM | POA: Diagnosis not present

## 2023-09-03 DIAGNOSIS — F0394 Unspecified dementia, unspecified severity, with anxiety: Secondary | ICD-10-CM | POA: Diagnosis not present

## 2023-09-03 DIAGNOSIS — I890 Lymphedema, not elsewhere classified: Secondary | ICD-10-CM | POA: Diagnosis not present

## 2023-09-03 DIAGNOSIS — E782 Mixed hyperlipidemia: Secondary | ICD-10-CM | POA: Diagnosis not present

## 2023-09-03 DIAGNOSIS — I739 Peripheral vascular disease, unspecified: Secondary | ICD-10-CM | POA: Diagnosis not present

## 2023-09-04 ENCOUNTER — Encounter (HOSPITAL_BASED_OUTPATIENT_CLINIC_OR_DEPARTMENT_OTHER): Attending: Internal Medicine | Admitting: Internal Medicine

## 2023-09-04 DIAGNOSIS — I87312 Chronic venous hypertension (idiopathic) with ulcer of left lower extremity: Secondary | ICD-10-CM | POA: Insufficient documentation

## 2023-09-04 DIAGNOSIS — L97822 Non-pressure chronic ulcer of other part of left lower leg with fat layer exposed: Secondary | ICD-10-CM | POA: Diagnosis not present

## 2023-09-04 DIAGNOSIS — L97812 Non-pressure chronic ulcer of other part of right lower leg with fat layer exposed: Secondary | ICD-10-CM | POA: Diagnosis not present

## 2023-09-04 DIAGNOSIS — I4891 Unspecified atrial fibrillation: Secondary | ICD-10-CM | POA: Insufficient documentation

## 2023-09-04 DIAGNOSIS — Z7901 Long term (current) use of anticoagulants: Secondary | ICD-10-CM | POA: Insufficient documentation

## 2023-09-04 DIAGNOSIS — I87311 Chronic venous hypertension (idiopathic) with ulcer of right lower extremity: Secondary | ICD-10-CM | POA: Insufficient documentation

## 2023-09-04 DIAGNOSIS — F0390 Unspecified dementia without behavioral disturbance: Secondary | ICD-10-CM | POA: Insufficient documentation

## 2023-09-04 DIAGNOSIS — I890 Lymphedema, not elsewhere classified: Secondary | ICD-10-CM | POA: Diagnosis not present

## 2023-09-05 DIAGNOSIS — G8929 Other chronic pain: Secondary | ICD-10-CM | POA: Diagnosis not present

## 2023-09-05 DIAGNOSIS — F0394 Unspecified dementia, unspecified severity, with anxiety: Secondary | ICD-10-CM | POA: Diagnosis not present

## 2023-09-05 DIAGNOSIS — F32A Depression, unspecified: Secondary | ICD-10-CM | POA: Diagnosis not present

## 2023-09-05 DIAGNOSIS — I890 Lymphedema, not elsewhere classified: Secondary | ICD-10-CM | POA: Diagnosis not present

## 2023-09-05 DIAGNOSIS — Z7901 Long term (current) use of anticoagulants: Secondary | ICD-10-CM | POA: Diagnosis not present

## 2023-09-05 DIAGNOSIS — I482 Chronic atrial fibrillation, unspecified: Secondary | ICD-10-CM | POA: Diagnosis not present

## 2023-09-05 DIAGNOSIS — R600 Localized edema: Secondary | ICD-10-CM | POA: Diagnosis not present

## 2023-09-05 DIAGNOSIS — I87321 Chronic venous hypertension (idiopathic) with inflammation of right lower extremity: Secondary | ICD-10-CM | POA: Diagnosis not present

## 2023-09-05 DIAGNOSIS — L97811 Non-pressure chronic ulcer of other part of right lower leg limited to breakdown of skin: Secondary | ICD-10-CM | POA: Diagnosis not present

## 2023-09-05 DIAGNOSIS — E559 Vitamin D deficiency, unspecified: Secondary | ICD-10-CM | POA: Diagnosis not present

## 2023-09-05 DIAGNOSIS — F0393 Unspecified dementia, unspecified severity, with mood disturbance: Secondary | ICD-10-CM | POA: Diagnosis not present

## 2023-09-05 DIAGNOSIS — F4321 Adjustment disorder with depressed mood: Secondary | ICD-10-CM | POA: Diagnosis not present

## 2023-09-05 DIAGNOSIS — I739 Peripheral vascular disease, unspecified: Secondary | ICD-10-CM | POA: Diagnosis not present

## 2023-09-05 DIAGNOSIS — E782 Mixed hyperlipidemia: Secondary | ICD-10-CM | POA: Diagnosis not present

## 2023-09-07 DIAGNOSIS — I87321 Chronic venous hypertension (idiopathic) with inflammation of right lower extremity: Secondary | ICD-10-CM | POA: Diagnosis not present

## 2023-09-07 DIAGNOSIS — R600 Localized edema: Secondary | ICD-10-CM | POA: Diagnosis not present

## 2023-09-07 DIAGNOSIS — F4321 Adjustment disorder with depressed mood: Secondary | ICD-10-CM | POA: Diagnosis not present

## 2023-09-07 DIAGNOSIS — Z7901 Long term (current) use of anticoagulants: Secondary | ICD-10-CM | POA: Diagnosis not present

## 2023-09-07 DIAGNOSIS — F0393 Unspecified dementia, unspecified severity, with mood disturbance: Secondary | ICD-10-CM | POA: Diagnosis not present

## 2023-09-07 DIAGNOSIS — I739 Peripheral vascular disease, unspecified: Secondary | ICD-10-CM | POA: Diagnosis not present

## 2023-09-07 DIAGNOSIS — I482 Chronic atrial fibrillation, unspecified: Secondary | ICD-10-CM | POA: Diagnosis not present

## 2023-09-07 DIAGNOSIS — F32A Depression, unspecified: Secondary | ICD-10-CM | POA: Diagnosis not present

## 2023-09-07 DIAGNOSIS — G8929 Other chronic pain: Secondary | ICD-10-CM | POA: Diagnosis not present

## 2023-09-07 DIAGNOSIS — L97811 Non-pressure chronic ulcer of other part of right lower leg limited to breakdown of skin: Secondary | ICD-10-CM | POA: Diagnosis not present

## 2023-09-07 DIAGNOSIS — F0394 Unspecified dementia, unspecified severity, with anxiety: Secondary | ICD-10-CM | POA: Diagnosis not present

## 2023-09-07 DIAGNOSIS — I890 Lymphedema, not elsewhere classified: Secondary | ICD-10-CM | POA: Diagnosis not present

## 2023-09-07 DIAGNOSIS — E559 Vitamin D deficiency, unspecified: Secondary | ICD-10-CM | POA: Diagnosis not present

## 2023-09-07 DIAGNOSIS — E782 Mixed hyperlipidemia: Secondary | ICD-10-CM | POA: Diagnosis not present

## 2023-09-08 DIAGNOSIS — I739 Peripheral vascular disease, unspecified: Secondary | ICD-10-CM | POA: Diagnosis not present

## 2023-09-08 DIAGNOSIS — I87321 Chronic venous hypertension (idiopathic) with inflammation of right lower extremity: Secondary | ICD-10-CM | POA: Diagnosis not present

## 2023-09-08 DIAGNOSIS — Z7901 Long term (current) use of anticoagulants: Secondary | ICD-10-CM | POA: Diagnosis not present

## 2023-09-08 DIAGNOSIS — I890 Lymphedema, not elsewhere classified: Secondary | ICD-10-CM | POA: Diagnosis not present

## 2023-09-08 DIAGNOSIS — E782 Mixed hyperlipidemia: Secondary | ICD-10-CM | POA: Diagnosis not present

## 2023-09-08 DIAGNOSIS — R600 Localized edema: Secondary | ICD-10-CM | POA: Diagnosis not present

## 2023-09-08 DIAGNOSIS — G8929 Other chronic pain: Secondary | ICD-10-CM | POA: Diagnosis not present

## 2023-09-08 DIAGNOSIS — E559 Vitamin D deficiency, unspecified: Secondary | ICD-10-CM | POA: Diagnosis not present

## 2023-09-08 DIAGNOSIS — F0394 Unspecified dementia, unspecified severity, with anxiety: Secondary | ICD-10-CM | POA: Diagnosis not present

## 2023-09-08 DIAGNOSIS — F4321 Adjustment disorder with depressed mood: Secondary | ICD-10-CM | POA: Diagnosis not present

## 2023-09-08 DIAGNOSIS — L97811 Non-pressure chronic ulcer of other part of right lower leg limited to breakdown of skin: Secondary | ICD-10-CM | POA: Diagnosis not present

## 2023-09-08 DIAGNOSIS — I482 Chronic atrial fibrillation, unspecified: Secondary | ICD-10-CM | POA: Diagnosis not present

## 2023-09-08 DIAGNOSIS — F0393 Unspecified dementia, unspecified severity, with mood disturbance: Secondary | ICD-10-CM | POA: Diagnosis not present

## 2023-09-08 DIAGNOSIS — F32A Depression, unspecified: Secondary | ICD-10-CM | POA: Diagnosis not present

## 2023-09-10 DIAGNOSIS — F321 Major depressive disorder, single episode, moderate: Secondary | ICD-10-CM | POA: Diagnosis not present

## 2023-09-10 DIAGNOSIS — G8929 Other chronic pain: Secondary | ICD-10-CM | POA: Diagnosis not present

## 2023-09-10 DIAGNOSIS — I739 Peripheral vascular disease, unspecified: Secondary | ICD-10-CM | POA: Diagnosis not present

## 2023-09-10 DIAGNOSIS — F0393 Unspecified dementia, unspecified severity, with mood disturbance: Secondary | ICD-10-CM | POA: Diagnosis not present

## 2023-09-10 DIAGNOSIS — L97811 Non-pressure chronic ulcer of other part of right lower leg limited to breakdown of skin: Secondary | ICD-10-CM | POA: Diagnosis not present

## 2023-09-10 DIAGNOSIS — F4321 Adjustment disorder with depressed mood: Secondary | ICD-10-CM | POA: Diagnosis not present

## 2023-09-10 DIAGNOSIS — E782 Mixed hyperlipidemia: Secondary | ICD-10-CM | POA: Diagnosis not present

## 2023-09-10 DIAGNOSIS — I87321 Chronic venous hypertension (idiopathic) with inflammation of right lower extremity: Secondary | ICD-10-CM | POA: Diagnosis not present

## 2023-09-10 DIAGNOSIS — F0394 Unspecified dementia, unspecified severity, with anxiety: Secondary | ICD-10-CM | POA: Diagnosis not present

## 2023-09-10 DIAGNOSIS — I890 Lymphedema, not elsewhere classified: Secondary | ICD-10-CM | POA: Diagnosis not present

## 2023-09-10 DIAGNOSIS — E559 Vitamin D deficiency, unspecified: Secondary | ICD-10-CM | POA: Diagnosis not present

## 2023-09-10 DIAGNOSIS — Z7901 Long term (current) use of anticoagulants: Secondary | ICD-10-CM | POA: Diagnosis not present

## 2023-09-10 DIAGNOSIS — G301 Alzheimer's disease with late onset: Secondary | ICD-10-CM | POA: Diagnosis not present

## 2023-09-10 DIAGNOSIS — F32A Depression, unspecified: Secondary | ICD-10-CM | POA: Diagnosis not present

## 2023-09-10 DIAGNOSIS — R600 Localized edema: Secondary | ICD-10-CM | POA: Diagnosis not present

## 2023-09-10 DIAGNOSIS — I482 Chronic atrial fibrillation, unspecified: Secondary | ICD-10-CM | POA: Diagnosis not present

## 2023-09-12 DIAGNOSIS — R600 Localized edema: Secondary | ICD-10-CM | POA: Diagnosis not present

## 2023-09-12 DIAGNOSIS — F0394 Unspecified dementia, unspecified severity, with anxiety: Secondary | ICD-10-CM | POA: Diagnosis not present

## 2023-09-12 DIAGNOSIS — E782 Mixed hyperlipidemia: Secondary | ICD-10-CM | POA: Diagnosis not present

## 2023-09-12 DIAGNOSIS — E559 Vitamin D deficiency, unspecified: Secondary | ICD-10-CM | POA: Diagnosis not present

## 2023-09-12 DIAGNOSIS — I739 Peripheral vascular disease, unspecified: Secondary | ICD-10-CM | POA: Diagnosis not present

## 2023-09-12 DIAGNOSIS — I87321 Chronic venous hypertension (idiopathic) with inflammation of right lower extremity: Secondary | ICD-10-CM | POA: Diagnosis not present

## 2023-09-12 DIAGNOSIS — G8929 Other chronic pain: Secondary | ICD-10-CM | POA: Diagnosis not present

## 2023-09-12 DIAGNOSIS — I482 Chronic atrial fibrillation, unspecified: Secondary | ICD-10-CM | POA: Diagnosis not present

## 2023-09-12 DIAGNOSIS — F0393 Unspecified dementia, unspecified severity, with mood disturbance: Secondary | ICD-10-CM | POA: Diagnosis not present

## 2023-09-12 DIAGNOSIS — I890 Lymphedema, not elsewhere classified: Secondary | ICD-10-CM | POA: Diagnosis not present

## 2023-09-12 DIAGNOSIS — F32A Depression, unspecified: Secondary | ICD-10-CM | POA: Diagnosis not present

## 2023-09-12 DIAGNOSIS — L97811 Non-pressure chronic ulcer of other part of right lower leg limited to breakdown of skin: Secondary | ICD-10-CM | POA: Diagnosis not present

## 2023-09-12 DIAGNOSIS — F4321 Adjustment disorder with depressed mood: Secondary | ICD-10-CM | POA: Diagnosis not present

## 2023-09-12 DIAGNOSIS — Z7901 Long term (current) use of anticoagulants: Secondary | ICD-10-CM | POA: Diagnosis not present

## 2023-09-14 DIAGNOSIS — F0394 Unspecified dementia, unspecified severity, with anxiety: Secondary | ICD-10-CM | POA: Diagnosis not present

## 2023-09-14 DIAGNOSIS — R600 Localized edema: Secondary | ICD-10-CM | POA: Diagnosis not present

## 2023-09-14 DIAGNOSIS — F32A Depression, unspecified: Secondary | ICD-10-CM | POA: Diagnosis not present

## 2023-09-14 DIAGNOSIS — F4321 Adjustment disorder with depressed mood: Secondary | ICD-10-CM | POA: Diagnosis not present

## 2023-09-14 DIAGNOSIS — I890 Lymphedema, not elsewhere classified: Secondary | ICD-10-CM | POA: Diagnosis not present

## 2023-09-14 DIAGNOSIS — G8929 Other chronic pain: Secondary | ICD-10-CM | POA: Diagnosis not present

## 2023-09-14 DIAGNOSIS — I482 Chronic atrial fibrillation, unspecified: Secondary | ICD-10-CM | POA: Diagnosis not present

## 2023-09-14 DIAGNOSIS — E559 Vitamin D deficiency, unspecified: Secondary | ICD-10-CM | POA: Diagnosis not present

## 2023-09-14 DIAGNOSIS — L97811 Non-pressure chronic ulcer of other part of right lower leg limited to breakdown of skin: Secondary | ICD-10-CM | POA: Diagnosis not present

## 2023-09-14 DIAGNOSIS — E782 Mixed hyperlipidemia: Secondary | ICD-10-CM | POA: Diagnosis not present

## 2023-09-14 DIAGNOSIS — I739 Peripheral vascular disease, unspecified: Secondary | ICD-10-CM | POA: Diagnosis not present

## 2023-09-14 DIAGNOSIS — Z7901 Long term (current) use of anticoagulants: Secondary | ICD-10-CM | POA: Diagnosis not present

## 2023-09-14 DIAGNOSIS — I87321 Chronic venous hypertension (idiopathic) with inflammation of right lower extremity: Secondary | ICD-10-CM | POA: Diagnosis not present

## 2023-09-14 DIAGNOSIS — F0393 Unspecified dementia, unspecified severity, with mood disturbance: Secondary | ICD-10-CM | POA: Diagnosis not present

## 2023-09-17 DIAGNOSIS — G8929 Other chronic pain: Secondary | ICD-10-CM | POA: Diagnosis not present

## 2023-09-17 DIAGNOSIS — E559 Vitamin D deficiency, unspecified: Secondary | ICD-10-CM | POA: Diagnosis not present

## 2023-09-17 DIAGNOSIS — F0394 Unspecified dementia, unspecified severity, with anxiety: Secondary | ICD-10-CM | POA: Diagnosis not present

## 2023-09-17 DIAGNOSIS — R600 Localized edema: Secondary | ICD-10-CM | POA: Diagnosis not present

## 2023-09-17 DIAGNOSIS — E782 Mixed hyperlipidemia: Secondary | ICD-10-CM | POA: Diagnosis not present

## 2023-09-17 DIAGNOSIS — F0393 Unspecified dementia, unspecified severity, with mood disturbance: Secondary | ICD-10-CM | POA: Diagnosis not present

## 2023-09-17 DIAGNOSIS — I890 Lymphedema, not elsewhere classified: Secondary | ICD-10-CM | POA: Diagnosis not present

## 2023-09-17 DIAGNOSIS — F4321 Adjustment disorder with depressed mood: Secondary | ICD-10-CM | POA: Diagnosis not present

## 2023-09-17 DIAGNOSIS — L97811 Non-pressure chronic ulcer of other part of right lower leg limited to breakdown of skin: Secondary | ICD-10-CM | POA: Diagnosis not present

## 2023-09-17 DIAGNOSIS — I482 Chronic atrial fibrillation, unspecified: Secondary | ICD-10-CM | POA: Diagnosis not present

## 2023-09-17 DIAGNOSIS — F32A Depression, unspecified: Secondary | ICD-10-CM | POA: Diagnosis not present

## 2023-09-17 DIAGNOSIS — I739 Peripheral vascular disease, unspecified: Secondary | ICD-10-CM | POA: Diagnosis not present

## 2023-09-17 DIAGNOSIS — I87321 Chronic venous hypertension (idiopathic) with inflammation of right lower extremity: Secondary | ICD-10-CM | POA: Diagnosis not present

## 2023-09-17 DIAGNOSIS — Z7901 Long term (current) use of anticoagulants: Secondary | ICD-10-CM | POA: Diagnosis not present

## 2023-09-18 ENCOUNTER — Encounter (HOSPITAL_BASED_OUTPATIENT_CLINIC_OR_DEPARTMENT_OTHER): Admitting: Internal Medicine

## 2023-09-18 DIAGNOSIS — I482 Chronic atrial fibrillation, unspecified: Secondary | ICD-10-CM | POA: Diagnosis not present

## 2023-09-18 DIAGNOSIS — I890 Lymphedema, not elsewhere classified: Secondary | ICD-10-CM | POA: Diagnosis not present

## 2023-09-18 DIAGNOSIS — F4321 Adjustment disorder with depressed mood: Secondary | ICD-10-CM | POA: Diagnosis not present

## 2023-09-18 DIAGNOSIS — E559 Vitamin D deficiency, unspecified: Secondary | ICD-10-CM | POA: Diagnosis not present

## 2023-09-18 DIAGNOSIS — I87321 Chronic venous hypertension (idiopathic) with inflammation of right lower extremity: Secondary | ICD-10-CM | POA: Diagnosis not present

## 2023-09-18 DIAGNOSIS — G8929 Other chronic pain: Secondary | ICD-10-CM | POA: Diagnosis not present

## 2023-09-18 DIAGNOSIS — F0394 Unspecified dementia, unspecified severity, with anxiety: Secondary | ICD-10-CM | POA: Diagnosis not present

## 2023-09-18 DIAGNOSIS — F32A Depression, unspecified: Secondary | ICD-10-CM | POA: Diagnosis not present

## 2023-09-18 DIAGNOSIS — L97811 Non-pressure chronic ulcer of other part of right lower leg limited to breakdown of skin: Secondary | ICD-10-CM | POA: Diagnosis not present

## 2023-09-18 DIAGNOSIS — E782 Mixed hyperlipidemia: Secondary | ICD-10-CM | POA: Diagnosis not present

## 2023-09-18 DIAGNOSIS — F0393 Unspecified dementia, unspecified severity, with mood disturbance: Secondary | ICD-10-CM | POA: Diagnosis not present

## 2023-09-18 DIAGNOSIS — I739 Peripheral vascular disease, unspecified: Secondary | ICD-10-CM | POA: Diagnosis not present

## 2023-09-18 DIAGNOSIS — R600 Localized edema: Secondary | ICD-10-CM | POA: Diagnosis not present

## 2023-09-18 DIAGNOSIS — Z7901 Long term (current) use of anticoagulants: Secondary | ICD-10-CM | POA: Diagnosis not present

## 2023-09-20 DIAGNOSIS — I87321 Chronic venous hypertension (idiopathic) with inflammation of right lower extremity: Secondary | ICD-10-CM | POA: Diagnosis not present

## 2023-09-20 DIAGNOSIS — E559 Vitamin D deficiency, unspecified: Secondary | ICD-10-CM | POA: Diagnosis not present

## 2023-09-20 DIAGNOSIS — F4321 Adjustment disorder with depressed mood: Secondary | ICD-10-CM | POA: Diagnosis not present

## 2023-09-20 DIAGNOSIS — F0393 Unspecified dementia, unspecified severity, with mood disturbance: Secondary | ICD-10-CM | POA: Diagnosis not present

## 2023-09-20 DIAGNOSIS — E782 Mixed hyperlipidemia: Secondary | ICD-10-CM | POA: Diagnosis not present

## 2023-09-20 DIAGNOSIS — R600 Localized edema: Secondary | ICD-10-CM | POA: Diagnosis not present

## 2023-09-20 DIAGNOSIS — I739 Peripheral vascular disease, unspecified: Secondary | ICD-10-CM | POA: Diagnosis not present

## 2023-09-20 DIAGNOSIS — F0394 Unspecified dementia, unspecified severity, with anxiety: Secondary | ICD-10-CM | POA: Diagnosis not present

## 2023-09-20 DIAGNOSIS — I482 Chronic atrial fibrillation, unspecified: Secondary | ICD-10-CM | POA: Diagnosis not present

## 2023-09-20 DIAGNOSIS — F32A Depression, unspecified: Secondary | ICD-10-CM | POA: Diagnosis not present

## 2023-09-20 DIAGNOSIS — L97811 Non-pressure chronic ulcer of other part of right lower leg limited to breakdown of skin: Secondary | ICD-10-CM | POA: Diagnosis not present

## 2023-09-20 DIAGNOSIS — G8929 Other chronic pain: Secondary | ICD-10-CM | POA: Diagnosis not present

## 2023-09-20 DIAGNOSIS — Z7901 Long term (current) use of anticoagulants: Secondary | ICD-10-CM | POA: Diagnosis not present

## 2023-09-20 DIAGNOSIS — I890 Lymphedema, not elsewhere classified: Secondary | ICD-10-CM | POA: Diagnosis not present

## 2023-09-25 DIAGNOSIS — F32A Depression, unspecified: Secondary | ICD-10-CM | POA: Diagnosis not present

## 2023-09-25 DIAGNOSIS — L97811 Non-pressure chronic ulcer of other part of right lower leg limited to breakdown of skin: Secondary | ICD-10-CM | POA: Diagnosis not present

## 2023-09-25 DIAGNOSIS — F4321 Adjustment disorder with depressed mood: Secondary | ICD-10-CM | POA: Diagnosis not present

## 2023-09-25 DIAGNOSIS — I890 Lymphedema, not elsewhere classified: Secondary | ICD-10-CM | POA: Diagnosis not present

## 2023-09-25 DIAGNOSIS — I87321 Chronic venous hypertension (idiopathic) with inflammation of right lower extremity: Secondary | ICD-10-CM | POA: Diagnosis not present

## 2023-09-25 DIAGNOSIS — E559 Vitamin D deficiency, unspecified: Secondary | ICD-10-CM | POA: Diagnosis not present

## 2023-09-25 DIAGNOSIS — R600 Localized edema: Secondary | ICD-10-CM | POA: Diagnosis not present

## 2023-09-25 DIAGNOSIS — F0394 Unspecified dementia, unspecified severity, with anxiety: Secondary | ICD-10-CM | POA: Diagnosis not present

## 2023-09-25 DIAGNOSIS — I739 Peripheral vascular disease, unspecified: Secondary | ICD-10-CM | POA: Diagnosis not present

## 2023-09-25 DIAGNOSIS — I482 Chronic atrial fibrillation, unspecified: Secondary | ICD-10-CM | POA: Diagnosis not present

## 2023-09-25 DIAGNOSIS — F0393 Unspecified dementia, unspecified severity, with mood disturbance: Secondary | ICD-10-CM | POA: Diagnosis not present

## 2023-09-25 DIAGNOSIS — G8929 Other chronic pain: Secondary | ICD-10-CM | POA: Diagnosis not present

## 2023-09-25 DIAGNOSIS — E782 Mixed hyperlipidemia: Secondary | ICD-10-CM | POA: Diagnosis not present

## 2023-09-25 DIAGNOSIS — Z7901 Long term (current) use of anticoagulants: Secondary | ICD-10-CM | POA: Diagnosis not present

## 2023-09-26 DIAGNOSIS — F0393 Unspecified dementia, unspecified severity, with mood disturbance: Secondary | ICD-10-CM | POA: Diagnosis not present

## 2023-09-26 DIAGNOSIS — L97811 Non-pressure chronic ulcer of other part of right lower leg limited to breakdown of skin: Secondary | ICD-10-CM | POA: Diagnosis not present

## 2023-09-26 DIAGNOSIS — Z7901 Long term (current) use of anticoagulants: Secondary | ICD-10-CM | POA: Diagnosis not present

## 2023-09-26 DIAGNOSIS — I739 Peripheral vascular disease, unspecified: Secondary | ICD-10-CM | POA: Diagnosis not present

## 2023-09-26 DIAGNOSIS — E782 Mixed hyperlipidemia: Secondary | ICD-10-CM | POA: Diagnosis not present

## 2023-09-26 DIAGNOSIS — F4321 Adjustment disorder with depressed mood: Secondary | ICD-10-CM | POA: Diagnosis not present

## 2023-09-26 DIAGNOSIS — I890 Lymphedema, not elsewhere classified: Secondary | ICD-10-CM | POA: Diagnosis not present

## 2023-09-26 DIAGNOSIS — E559 Vitamin D deficiency, unspecified: Secondary | ICD-10-CM | POA: Diagnosis not present

## 2023-09-26 DIAGNOSIS — F0394 Unspecified dementia, unspecified severity, with anxiety: Secondary | ICD-10-CM | POA: Diagnosis not present

## 2023-09-26 DIAGNOSIS — G8929 Other chronic pain: Secondary | ICD-10-CM | POA: Diagnosis not present

## 2023-09-26 DIAGNOSIS — R600 Localized edema: Secondary | ICD-10-CM | POA: Diagnosis not present

## 2023-09-26 DIAGNOSIS — F32A Depression, unspecified: Secondary | ICD-10-CM | POA: Diagnosis not present

## 2023-09-26 DIAGNOSIS — I482 Chronic atrial fibrillation, unspecified: Secondary | ICD-10-CM | POA: Diagnosis not present

## 2023-09-26 DIAGNOSIS — I87321 Chronic venous hypertension (idiopathic) with inflammation of right lower extremity: Secondary | ICD-10-CM | POA: Diagnosis not present

## 2023-09-27 ENCOUNTER — Ambulatory Visit (HOSPITAL_BASED_OUTPATIENT_CLINIC_OR_DEPARTMENT_OTHER): Admitting: Internal Medicine

## 2023-09-28 DIAGNOSIS — F32A Depression, unspecified: Secondary | ICD-10-CM | POA: Diagnosis not present

## 2023-09-28 DIAGNOSIS — G8929 Other chronic pain: Secondary | ICD-10-CM | POA: Diagnosis not present

## 2023-09-28 DIAGNOSIS — I482 Chronic atrial fibrillation, unspecified: Secondary | ICD-10-CM | POA: Diagnosis not present

## 2023-09-28 DIAGNOSIS — L97811 Non-pressure chronic ulcer of other part of right lower leg limited to breakdown of skin: Secondary | ICD-10-CM | POA: Diagnosis not present

## 2023-09-28 DIAGNOSIS — E782 Mixed hyperlipidemia: Secondary | ICD-10-CM | POA: Diagnosis not present

## 2023-09-28 DIAGNOSIS — F4321 Adjustment disorder with depressed mood: Secondary | ICD-10-CM | POA: Diagnosis not present

## 2023-09-28 DIAGNOSIS — I739 Peripheral vascular disease, unspecified: Secondary | ICD-10-CM | POA: Diagnosis not present

## 2023-09-28 DIAGNOSIS — R600 Localized edema: Secondary | ICD-10-CM | POA: Diagnosis not present

## 2023-09-28 DIAGNOSIS — F0393 Unspecified dementia, unspecified severity, with mood disturbance: Secondary | ICD-10-CM | POA: Diagnosis not present

## 2023-09-28 DIAGNOSIS — E559 Vitamin D deficiency, unspecified: Secondary | ICD-10-CM | POA: Diagnosis not present

## 2023-09-28 DIAGNOSIS — I87321 Chronic venous hypertension (idiopathic) with inflammation of right lower extremity: Secondary | ICD-10-CM | POA: Diagnosis not present

## 2023-09-28 DIAGNOSIS — I890 Lymphedema, not elsewhere classified: Secondary | ICD-10-CM | POA: Diagnosis not present

## 2023-09-28 DIAGNOSIS — Z7901 Long term (current) use of anticoagulants: Secondary | ICD-10-CM | POA: Diagnosis not present

## 2023-09-28 DIAGNOSIS — F0394 Unspecified dementia, unspecified severity, with anxiety: Secondary | ICD-10-CM | POA: Diagnosis not present

## 2023-10-01 DIAGNOSIS — I482 Chronic atrial fibrillation, unspecified: Secondary | ICD-10-CM | POA: Diagnosis not present

## 2023-10-01 DIAGNOSIS — F4321 Adjustment disorder with depressed mood: Secondary | ICD-10-CM | POA: Diagnosis not present

## 2023-10-01 DIAGNOSIS — R600 Localized edema: Secondary | ICD-10-CM | POA: Diagnosis not present

## 2023-10-01 DIAGNOSIS — G8929 Other chronic pain: Secondary | ICD-10-CM | POA: Diagnosis not present

## 2023-10-01 DIAGNOSIS — E782 Mixed hyperlipidemia: Secondary | ICD-10-CM | POA: Diagnosis not present

## 2023-10-01 DIAGNOSIS — F0394 Unspecified dementia, unspecified severity, with anxiety: Secondary | ICD-10-CM | POA: Diagnosis not present

## 2023-10-01 DIAGNOSIS — I87321 Chronic venous hypertension (idiopathic) with inflammation of right lower extremity: Secondary | ICD-10-CM | POA: Diagnosis not present

## 2023-10-01 DIAGNOSIS — F32A Depression, unspecified: Secondary | ICD-10-CM | POA: Diagnosis not present

## 2023-10-01 DIAGNOSIS — I890 Lymphedema, not elsewhere classified: Secondary | ICD-10-CM | POA: Diagnosis not present

## 2023-10-01 DIAGNOSIS — E559 Vitamin D deficiency, unspecified: Secondary | ICD-10-CM | POA: Diagnosis not present

## 2023-10-01 DIAGNOSIS — L97811 Non-pressure chronic ulcer of other part of right lower leg limited to breakdown of skin: Secondary | ICD-10-CM | POA: Diagnosis not present

## 2023-10-01 DIAGNOSIS — F0393 Unspecified dementia, unspecified severity, with mood disturbance: Secondary | ICD-10-CM | POA: Diagnosis not present

## 2023-10-01 DIAGNOSIS — Z7901 Long term (current) use of anticoagulants: Secondary | ICD-10-CM | POA: Diagnosis not present

## 2023-10-01 DIAGNOSIS — I739 Peripheral vascular disease, unspecified: Secondary | ICD-10-CM | POA: Diagnosis not present

## 2023-10-03 DIAGNOSIS — F32A Depression, unspecified: Secondary | ICD-10-CM | POA: Diagnosis not present

## 2023-10-03 DIAGNOSIS — L97811 Non-pressure chronic ulcer of other part of right lower leg limited to breakdown of skin: Secondary | ICD-10-CM | POA: Diagnosis not present

## 2023-10-03 DIAGNOSIS — I482 Chronic atrial fibrillation, unspecified: Secondary | ICD-10-CM | POA: Diagnosis not present

## 2023-10-03 DIAGNOSIS — R600 Localized edema: Secondary | ICD-10-CM | POA: Diagnosis not present

## 2023-10-03 DIAGNOSIS — E559 Vitamin D deficiency, unspecified: Secondary | ICD-10-CM | POA: Diagnosis not present

## 2023-10-03 DIAGNOSIS — F0393 Unspecified dementia, unspecified severity, with mood disturbance: Secondary | ICD-10-CM | POA: Diagnosis not present

## 2023-10-03 DIAGNOSIS — F4321 Adjustment disorder with depressed mood: Secondary | ICD-10-CM | POA: Diagnosis not present

## 2023-10-03 DIAGNOSIS — I87321 Chronic venous hypertension (idiopathic) with inflammation of right lower extremity: Secondary | ICD-10-CM | POA: Diagnosis not present

## 2023-10-03 DIAGNOSIS — G8929 Other chronic pain: Secondary | ICD-10-CM | POA: Diagnosis not present

## 2023-10-03 DIAGNOSIS — I739 Peripheral vascular disease, unspecified: Secondary | ICD-10-CM | POA: Diagnosis not present

## 2023-10-03 DIAGNOSIS — I890 Lymphedema, not elsewhere classified: Secondary | ICD-10-CM | POA: Diagnosis not present

## 2023-10-03 DIAGNOSIS — E782 Mixed hyperlipidemia: Secondary | ICD-10-CM | POA: Diagnosis not present

## 2023-10-03 DIAGNOSIS — F0394 Unspecified dementia, unspecified severity, with anxiety: Secondary | ICD-10-CM | POA: Diagnosis not present

## 2023-10-03 DIAGNOSIS — Z7901 Long term (current) use of anticoagulants: Secondary | ICD-10-CM | POA: Diagnosis not present

## 2023-10-04 DIAGNOSIS — I87321 Chronic venous hypertension (idiopathic) with inflammation of right lower extremity: Secondary | ICD-10-CM | POA: Diagnosis not present

## 2023-10-04 DIAGNOSIS — Z7901 Long term (current) use of anticoagulants: Secondary | ICD-10-CM | POA: Diagnosis not present

## 2023-10-04 DIAGNOSIS — E559 Vitamin D deficiency, unspecified: Secondary | ICD-10-CM | POA: Diagnosis not present

## 2023-10-04 DIAGNOSIS — I482 Chronic atrial fibrillation, unspecified: Secondary | ICD-10-CM | POA: Diagnosis not present

## 2023-10-04 DIAGNOSIS — R600 Localized edema: Secondary | ICD-10-CM | POA: Diagnosis not present

## 2023-10-04 DIAGNOSIS — F32A Depression, unspecified: Secondary | ICD-10-CM | POA: Diagnosis not present

## 2023-10-04 DIAGNOSIS — I890 Lymphedema, not elsewhere classified: Secondary | ICD-10-CM | POA: Diagnosis not present

## 2023-10-04 DIAGNOSIS — F0393 Unspecified dementia, unspecified severity, with mood disturbance: Secondary | ICD-10-CM | POA: Diagnosis not present

## 2023-10-04 DIAGNOSIS — L97811 Non-pressure chronic ulcer of other part of right lower leg limited to breakdown of skin: Secondary | ICD-10-CM | POA: Diagnosis not present

## 2023-10-04 DIAGNOSIS — F0394 Unspecified dementia, unspecified severity, with anxiety: Secondary | ICD-10-CM | POA: Diagnosis not present

## 2023-10-04 DIAGNOSIS — G8929 Other chronic pain: Secondary | ICD-10-CM | POA: Diagnosis not present

## 2023-10-04 DIAGNOSIS — F4321 Adjustment disorder with depressed mood: Secondary | ICD-10-CM | POA: Diagnosis not present

## 2023-10-04 DIAGNOSIS — E782 Mixed hyperlipidemia: Secondary | ICD-10-CM | POA: Diagnosis not present

## 2023-10-04 DIAGNOSIS — I739 Peripheral vascular disease, unspecified: Secondary | ICD-10-CM | POA: Diagnosis not present

## 2023-10-05 DIAGNOSIS — F0393 Unspecified dementia, unspecified severity, with mood disturbance: Secondary | ICD-10-CM | POA: Diagnosis not present

## 2023-10-05 DIAGNOSIS — E782 Mixed hyperlipidemia: Secondary | ICD-10-CM | POA: Diagnosis not present

## 2023-10-05 DIAGNOSIS — F0394 Unspecified dementia, unspecified severity, with anxiety: Secondary | ICD-10-CM | POA: Diagnosis not present

## 2023-10-05 DIAGNOSIS — G8929 Other chronic pain: Secondary | ICD-10-CM | POA: Diagnosis not present

## 2023-10-05 DIAGNOSIS — I482 Chronic atrial fibrillation, unspecified: Secondary | ICD-10-CM | POA: Diagnosis not present

## 2023-10-05 DIAGNOSIS — I87321 Chronic venous hypertension (idiopathic) with inflammation of right lower extremity: Secondary | ICD-10-CM | POA: Diagnosis not present

## 2023-10-05 DIAGNOSIS — I890 Lymphedema, not elsewhere classified: Secondary | ICD-10-CM | POA: Diagnosis not present

## 2023-10-05 DIAGNOSIS — I739 Peripheral vascular disease, unspecified: Secondary | ICD-10-CM | POA: Diagnosis not present

## 2023-10-05 DIAGNOSIS — L97811 Non-pressure chronic ulcer of other part of right lower leg limited to breakdown of skin: Secondary | ICD-10-CM | POA: Diagnosis not present

## 2023-10-05 DIAGNOSIS — R600 Localized edema: Secondary | ICD-10-CM | POA: Diagnosis not present

## 2023-10-05 DIAGNOSIS — F4321 Adjustment disorder with depressed mood: Secondary | ICD-10-CM | POA: Diagnosis not present

## 2023-10-05 DIAGNOSIS — Z7901 Long term (current) use of anticoagulants: Secondary | ICD-10-CM | POA: Diagnosis not present

## 2023-10-05 DIAGNOSIS — E559 Vitamin D deficiency, unspecified: Secondary | ICD-10-CM | POA: Diagnosis not present

## 2023-10-05 DIAGNOSIS — F32A Depression, unspecified: Secondary | ICD-10-CM | POA: Diagnosis not present

## 2023-10-08 DIAGNOSIS — R600 Localized edema: Secondary | ICD-10-CM | POA: Diagnosis not present

## 2023-10-08 DIAGNOSIS — F4321 Adjustment disorder with depressed mood: Secondary | ICD-10-CM | POA: Diagnosis not present

## 2023-10-08 DIAGNOSIS — F0393 Unspecified dementia, unspecified severity, with mood disturbance: Secondary | ICD-10-CM | POA: Diagnosis not present

## 2023-10-08 DIAGNOSIS — F0394 Unspecified dementia, unspecified severity, with anxiety: Secondary | ICD-10-CM | POA: Diagnosis not present

## 2023-10-08 DIAGNOSIS — I890 Lymphedema, not elsewhere classified: Secondary | ICD-10-CM | POA: Diagnosis not present

## 2023-10-08 DIAGNOSIS — I87321 Chronic venous hypertension (idiopathic) with inflammation of right lower extremity: Secondary | ICD-10-CM | POA: Diagnosis not present

## 2023-10-08 DIAGNOSIS — E559 Vitamin D deficiency, unspecified: Secondary | ICD-10-CM | POA: Diagnosis not present

## 2023-10-08 DIAGNOSIS — E782 Mixed hyperlipidemia: Secondary | ICD-10-CM | POA: Diagnosis not present

## 2023-10-08 DIAGNOSIS — L97811 Non-pressure chronic ulcer of other part of right lower leg limited to breakdown of skin: Secondary | ICD-10-CM | POA: Diagnosis not present

## 2023-10-08 DIAGNOSIS — I482 Chronic atrial fibrillation, unspecified: Secondary | ICD-10-CM | POA: Diagnosis not present

## 2023-10-08 DIAGNOSIS — Z7901 Long term (current) use of anticoagulants: Secondary | ICD-10-CM | POA: Diagnosis not present

## 2023-10-08 DIAGNOSIS — G8929 Other chronic pain: Secondary | ICD-10-CM | POA: Diagnosis not present

## 2023-10-08 DIAGNOSIS — F32A Depression, unspecified: Secondary | ICD-10-CM | POA: Diagnosis not present

## 2023-10-08 DIAGNOSIS — I739 Peripheral vascular disease, unspecified: Secondary | ICD-10-CM | POA: Diagnosis not present

## 2023-10-10 DIAGNOSIS — Z7901 Long term (current) use of anticoagulants: Secondary | ICD-10-CM | POA: Diagnosis not present

## 2023-10-10 DIAGNOSIS — L97811 Non-pressure chronic ulcer of other part of right lower leg limited to breakdown of skin: Secondary | ICD-10-CM | POA: Diagnosis not present

## 2023-10-10 DIAGNOSIS — E782 Mixed hyperlipidemia: Secondary | ICD-10-CM | POA: Diagnosis not present

## 2023-10-10 DIAGNOSIS — G8929 Other chronic pain: Secondary | ICD-10-CM | POA: Diagnosis not present

## 2023-10-10 DIAGNOSIS — I482 Chronic atrial fibrillation, unspecified: Secondary | ICD-10-CM | POA: Diagnosis not present

## 2023-10-10 DIAGNOSIS — F0393 Unspecified dementia, unspecified severity, with mood disturbance: Secondary | ICD-10-CM | POA: Diagnosis not present

## 2023-10-10 DIAGNOSIS — I890 Lymphedema, not elsewhere classified: Secondary | ICD-10-CM | POA: Diagnosis not present

## 2023-10-10 DIAGNOSIS — I739 Peripheral vascular disease, unspecified: Secondary | ICD-10-CM | POA: Diagnosis not present

## 2023-10-10 DIAGNOSIS — F32A Depression, unspecified: Secondary | ICD-10-CM | POA: Diagnosis not present

## 2023-10-10 DIAGNOSIS — N1831 Chronic kidney disease, stage 3a: Secondary | ICD-10-CM | POA: Diagnosis not present

## 2023-10-10 DIAGNOSIS — I87321 Chronic venous hypertension (idiopathic) with inflammation of right lower extremity: Secondary | ICD-10-CM | POA: Diagnosis not present

## 2023-10-10 DIAGNOSIS — E559 Vitamin D deficiency, unspecified: Secondary | ICD-10-CM | POA: Diagnosis not present

## 2023-10-10 DIAGNOSIS — R600 Localized edema: Secondary | ICD-10-CM | POA: Diagnosis not present

## 2023-10-10 DIAGNOSIS — F0394 Unspecified dementia, unspecified severity, with anxiety: Secondary | ICD-10-CM | POA: Diagnosis not present

## 2023-10-10 DIAGNOSIS — F4321 Adjustment disorder with depressed mood: Secondary | ICD-10-CM | POA: Diagnosis not present

## 2023-10-11 DIAGNOSIS — I890 Lymphedema, not elsewhere classified: Secondary | ICD-10-CM | POA: Diagnosis not present

## 2023-10-11 DIAGNOSIS — E782 Mixed hyperlipidemia: Secondary | ICD-10-CM | POA: Diagnosis not present

## 2023-10-11 DIAGNOSIS — F0394 Unspecified dementia, unspecified severity, with anxiety: Secondary | ICD-10-CM | POA: Diagnosis not present

## 2023-10-11 DIAGNOSIS — F4321 Adjustment disorder with depressed mood: Secondary | ICD-10-CM | POA: Diagnosis not present

## 2023-10-11 DIAGNOSIS — F0393 Unspecified dementia, unspecified severity, with mood disturbance: Secondary | ICD-10-CM | POA: Diagnosis not present

## 2023-10-11 DIAGNOSIS — F32A Depression, unspecified: Secondary | ICD-10-CM | POA: Diagnosis not present

## 2023-10-11 DIAGNOSIS — E559 Vitamin D deficiency, unspecified: Secondary | ICD-10-CM | POA: Diagnosis not present

## 2023-10-11 DIAGNOSIS — I482 Chronic atrial fibrillation, unspecified: Secondary | ICD-10-CM | POA: Diagnosis not present

## 2023-10-11 DIAGNOSIS — L97811 Non-pressure chronic ulcer of other part of right lower leg limited to breakdown of skin: Secondary | ICD-10-CM | POA: Diagnosis not present

## 2023-10-11 DIAGNOSIS — G8929 Other chronic pain: Secondary | ICD-10-CM | POA: Diagnosis not present

## 2023-10-11 DIAGNOSIS — Z7901 Long term (current) use of anticoagulants: Secondary | ICD-10-CM | POA: Diagnosis not present

## 2023-10-11 DIAGNOSIS — I87321 Chronic venous hypertension (idiopathic) with inflammation of right lower extremity: Secondary | ICD-10-CM | POA: Diagnosis not present

## 2023-10-11 DIAGNOSIS — I739 Peripheral vascular disease, unspecified: Secondary | ICD-10-CM | POA: Diagnosis not present

## 2023-10-11 DIAGNOSIS — R600 Localized edema: Secondary | ICD-10-CM | POA: Diagnosis not present

## 2023-10-12 DIAGNOSIS — B351 Tinea unguium: Secondary | ICD-10-CM | POA: Diagnosis not present

## 2023-10-12 DIAGNOSIS — I739 Peripheral vascular disease, unspecified: Secondary | ICD-10-CM | POA: Diagnosis not present

## 2023-10-12 DIAGNOSIS — M79675 Pain in left toe(s): Secondary | ICD-10-CM | POA: Diagnosis not present

## 2023-10-15 DIAGNOSIS — I87321 Chronic venous hypertension (idiopathic) with inflammation of right lower extremity: Secondary | ICD-10-CM | POA: Diagnosis not present

## 2023-10-15 DIAGNOSIS — F0394 Unspecified dementia, unspecified severity, with anxiety: Secondary | ICD-10-CM | POA: Diagnosis not present

## 2023-10-15 DIAGNOSIS — E559 Vitamin D deficiency, unspecified: Secondary | ICD-10-CM | POA: Diagnosis not present

## 2023-10-15 DIAGNOSIS — F0393 Unspecified dementia, unspecified severity, with mood disturbance: Secondary | ICD-10-CM | POA: Diagnosis not present

## 2023-10-15 DIAGNOSIS — L97811 Non-pressure chronic ulcer of other part of right lower leg limited to breakdown of skin: Secondary | ICD-10-CM | POA: Diagnosis not present

## 2023-10-15 DIAGNOSIS — I482 Chronic atrial fibrillation, unspecified: Secondary | ICD-10-CM | POA: Diagnosis not present

## 2023-10-15 DIAGNOSIS — F32A Depression, unspecified: Secondary | ICD-10-CM | POA: Diagnosis not present

## 2023-10-15 DIAGNOSIS — R600 Localized edema: Secondary | ICD-10-CM | POA: Diagnosis not present

## 2023-10-15 DIAGNOSIS — E782 Mixed hyperlipidemia: Secondary | ICD-10-CM | POA: Diagnosis not present

## 2023-10-15 DIAGNOSIS — F4321 Adjustment disorder with depressed mood: Secondary | ICD-10-CM | POA: Diagnosis not present

## 2023-10-15 DIAGNOSIS — Z7901 Long term (current) use of anticoagulants: Secondary | ICD-10-CM | POA: Diagnosis not present

## 2023-10-15 DIAGNOSIS — G8929 Other chronic pain: Secondary | ICD-10-CM | POA: Diagnosis not present

## 2023-10-15 DIAGNOSIS — I890 Lymphedema, not elsewhere classified: Secondary | ICD-10-CM | POA: Diagnosis not present

## 2023-10-15 DIAGNOSIS — I739 Peripheral vascular disease, unspecified: Secondary | ICD-10-CM | POA: Diagnosis not present

## 2023-10-16 DIAGNOSIS — F0390 Unspecified dementia without behavioral disturbance: Secondary | ICD-10-CM | POA: Diagnosis not present

## 2023-10-16 DIAGNOSIS — I482 Chronic atrial fibrillation, unspecified: Secondary | ICD-10-CM | POA: Diagnosis not present

## 2023-10-16 DIAGNOSIS — I890 Lymphedema, not elsewhere classified: Secondary | ICD-10-CM | POA: Diagnosis not present

## 2023-10-16 DIAGNOSIS — F0393 Unspecified dementia, unspecified severity, with mood disturbance: Secondary | ICD-10-CM | POA: Diagnosis not present

## 2023-10-16 DIAGNOSIS — Z7901 Long term (current) use of anticoagulants: Secondary | ICD-10-CM | POA: Diagnosis not present

## 2023-10-16 DIAGNOSIS — F4321 Adjustment disorder with depressed mood: Secondary | ICD-10-CM | POA: Diagnosis not present

## 2023-10-16 DIAGNOSIS — R600 Localized edema: Secondary | ICD-10-CM | POA: Diagnosis not present

## 2023-10-16 DIAGNOSIS — I739 Peripheral vascular disease, unspecified: Secondary | ICD-10-CM | POA: Diagnosis not present

## 2023-10-16 DIAGNOSIS — L97811 Non-pressure chronic ulcer of other part of right lower leg limited to breakdown of skin: Secondary | ICD-10-CM | POA: Diagnosis not present

## 2023-10-16 DIAGNOSIS — I87321 Chronic venous hypertension (idiopathic) with inflammation of right lower extremity: Secondary | ICD-10-CM | POA: Diagnosis not present

## 2023-10-16 DIAGNOSIS — F0394 Unspecified dementia, unspecified severity, with anxiety: Secondary | ICD-10-CM | POA: Diagnosis not present

## 2023-10-16 DIAGNOSIS — G8929 Other chronic pain: Secondary | ICD-10-CM | POA: Diagnosis not present

## 2023-10-16 DIAGNOSIS — E559 Vitamin D deficiency, unspecified: Secondary | ICD-10-CM | POA: Diagnosis not present

## 2023-10-16 DIAGNOSIS — E782 Mixed hyperlipidemia: Secondary | ICD-10-CM | POA: Diagnosis not present

## 2023-10-16 DIAGNOSIS — F32A Depression, unspecified: Secondary | ICD-10-CM | POA: Diagnosis not present

## 2023-10-16 DIAGNOSIS — E785 Hyperlipidemia, unspecified: Secondary | ICD-10-CM | POA: Diagnosis not present

## 2023-10-17 ENCOUNTER — Ambulatory Visit (HOSPITAL_BASED_OUTPATIENT_CLINIC_OR_DEPARTMENT_OTHER): Admitting: Internal Medicine

## 2023-10-17 DIAGNOSIS — F4321 Adjustment disorder with depressed mood: Secondary | ICD-10-CM | POA: Diagnosis not present

## 2023-10-17 DIAGNOSIS — F32A Depression, unspecified: Secondary | ICD-10-CM | POA: Diagnosis not present

## 2023-10-17 DIAGNOSIS — E782 Mixed hyperlipidemia: Secondary | ICD-10-CM | POA: Diagnosis not present

## 2023-10-17 DIAGNOSIS — I890 Lymphedema, not elsewhere classified: Secondary | ICD-10-CM | POA: Diagnosis not present

## 2023-10-17 DIAGNOSIS — L97811 Non-pressure chronic ulcer of other part of right lower leg limited to breakdown of skin: Secondary | ICD-10-CM | POA: Diagnosis not present

## 2023-10-17 DIAGNOSIS — G8929 Other chronic pain: Secondary | ICD-10-CM | POA: Diagnosis not present

## 2023-10-17 DIAGNOSIS — F0394 Unspecified dementia, unspecified severity, with anxiety: Secondary | ICD-10-CM | POA: Diagnosis not present

## 2023-10-17 DIAGNOSIS — I87321 Chronic venous hypertension (idiopathic) with inflammation of right lower extremity: Secondary | ICD-10-CM | POA: Diagnosis not present

## 2023-10-17 DIAGNOSIS — Z7901 Long term (current) use of anticoagulants: Secondary | ICD-10-CM | POA: Diagnosis not present

## 2023-10-17 DIAGNOSIS — R600 Localized edema: Secondary | ICD-10-CM | POA: Diagnosis not present

## 2023-10-17 DIAGNOSIS — I739 Peripheral vascular disease, unspecified: Secondary | ICD-10-CM | POA: Diagnosis not present

## 2023-10-17 DIAGNOSIS — E559 Vitamin D deficiency, unspecified: Secondary | ICD-10-CM | POA: Diagnosis not present

## 2023-10-17 DIAGNOSIS — F0393 Unspecified dementia, unspecified severity, with mood disturbance: Secondary | ICD-10-CM | POA: Diagnosis not present

## 2023-10-17 DIAGNOSIS — I482 Chronic atrial fibrillation, unspecified: Secondary | ICD-10-CM | POA: Diagnosis not present

## 2023-10-19 DIAGNOSIS — I890 Lymphedema, not elsewhere classified: Secondary | ICD-10-CM | POA: Diagnosis not present

## 2023-10-19 DIAGNOSIS — F0394 Unspecified dementia, unspecified severity, with anxiety: Secondary | ICD-10-CM | POA: Diagnosis not present

## 2023-10-19 DIAGNOSIS — I87321 Chronic venous hypertension (idiopathic) with inflammation of right lower extremity: Secondary | ICD-10-CM | POA: Diagnosis not present

## 2023-10-19 DIAGNOSIS — F4321 Adjustment disorder with depressed mood: Secondary | ICD-10-CM | POA: Diagnosis not present

## 2023-10-19 DIAGNOSIS — R600 Localized edema: Secondary | ICD-10-CM | POA: Diagnosis not present

## 2023-10-19 DIAGNOSIS — F32A Depression, unspecified: Secondary | ICD-10-CM | POA: Diagnosis not present

## 2023-10-19 DIAGNOSIS — L97811 Non-pressure chronic ulcer of other part of right lower leg limited to breakdown of skin: Secondary | ICD-10-CM | POA: Diagnosis not present

## 2023-10-19 DIAGNOSIS — F0393 Unspecified dementia, unspecified severity, with mood disturbance: Secondary | ICD-10-CM | POA: Diagnosis not present

## 2023-10-19 DIAGNOSIS — E559 Vitamin D deficiency, unspecified: Secondary | ICD-10-CM | POA: Diagnosis not present

## 2023-10-19 DIAGNOSIS — E782 Mixed hyperlipidemia: Secondary | ICD-10-CM | POA: Diagnosis not present

## 2023-10-19 DIAGNOSIS — G8929 Other chronic pain: Secondary | ICD-10-CM | POA: Diagnosis not present

## 2023-10-19 DIAGNOSIS — Z7901 Long term (current) use of anticoagulants: Secondary | ICD-10-CM | POA: Diagnosis not present

## 2023-10-19 DIAGNOSIS — I482 Chronic atrial fibrillation, unspecified: Secondary | ICD-10-CM | POA: Diagnosis not present

## 2023-10-19 DIAGNOSIS — I739 Peripheral vascular disease, unspecified: Secondary | ICD-10-CM | POA: Diagnosis not present

## 2023-10-22 DIAGNOSIS — Z7901 Long term (current) use of anticoagulants: Secondary | ICD-10-CM | POA: Diagnosis not present

## 2023-10-22 DIAGNOSIS — I739 Peripheral vascular disease, unspecified: Secondary | ICD-10-CM | POA: Diagnosis not present

## 2023-10-22 DIAGNOSIS — L97811 Non-pressure chronic ulcer of other part of right lower leg limited to breakdown of skin: Secondary | ICD-10-CM | POA: Diagnosis not present

## 2023-10-22 DIAGNOSIS — E559 Vitamin D deficiency, unspecified: Secondary | ICD-10-CM | POA: Diagnosis not present

## 2023-10-22 DIAGNOSIS — F0394 Unspecified dementia, unspecified severity, with anxiety: Secondary | ICD-10-CM | POA: Diagnosis not present

## 2023-10-22 DIAGNOSIS — F4321 Adjustment disorder with depressed mood: Secondary | ICD-10-CM | POA: Diagnosis not present

## 2023-10-22 DIAGNOSIS — I482 Chronic atrial fibrillation, unspecified: Secondary | ICD-10-CM | POA: Diagnosis not present

## 2023-10-22 DIAGNOSIS — G8929 Other chronic pain: Secondary | ICD-10-CM | POA: Diagnosis not present

## 2023-10-22 DIAGNOSIS — F32A Depression, unspecified: Secondary | ICD-10-CM | POA: Diagnosis not present

## 2023-10-22 DIAGNOSIS — F0393 Unspecified dementia, unspecified severity, with mood disturbance: Secondary | ICD-10-CM | POA: Diagnosis not present

## 2023-10-22 DIAGNOSIS — I890 Lymphedema, not elsewhere classified: Secondary | ICD-10-CM | POA: Diagnosis not present

## 2023-10-22 DIAGNOSIS — E782 Mixed hyperlipidemia: Secondary | ICD-10-CM | POA: Diagnosis not present

## 2023-10-22 DIAGNOSIS — I87321 Chronic venous hypertension (idiopathic) with inflammation of right lower extremity: Secondary | ICD-10-CM | POA: Diagnosis not present

## 2023-10-22 DIAGNOSIS — R600 Localized edema: Secondary | ICD-10-CM | POA: Diagnosis not present

## 2023-10-24 DIAGNOSIS — F0393 Unspecified dementia, unspecified severity, with mood disturbance: Secondary | ICD-10-CM | POA: Diagnosis not present

## 2023-10-24 DIAGNOSIS — I482 Chronic atrial fibrillation, unspecified: Secondary | ICD-10-CM | POA: Diagnosis not present

## 2023-10-24 DIAGNOSIS — E782 Mixed hyperlipidemia: Secondary | ICD-10-CM | POA: Diagnosis not present

## 2023-10-24 DIAGNOSIS — R600 Localized edema: Secondary | ICD-10-CM | POA: Diagnosis not present

## 2023-10-24 DIAGNOSIS — L97811 Non-pressure chronic ulcer of other part of right lower leg limited to breakdown of skin: Secondary | ICD-10-CM | POA: Diagnosis not present

## 2023-10-24 DIAGNOSIS — I739 Peripheral vascular disease, unspecified: Secondary | ICD-10-CM | POA: Diagnosis not present

## 2023-10-24 DIAGNOSIS — E559 Vitamin D deficiency, unspecified: Secondary | ICD-10-CM | POA: Diagnosis not present

## 2023-10-24 DIAGNOSIS — G8929 Other chronic pain: Secondary | ICD-10-CM | POA: Diagnosis not present

## 2023-10-24 DIAGNOSIS — F0394 Unspecified dementia, unspecified severity, with anxiety: Secondary | ICD-10-CM | POA: Diagnosis not present

## 2023-10-24 DIAGNOSIS — F4321 Adjustment disorder with depressed mood: Secondary | ICD-10-CM | POA: Diagnosis not present

## 2023-10-24 DIAGNOSIS — F32A Depression, unspecified: Secondary | ICD-10-CM | POA: Diagnosis not present

## 2023-10-24 DIAGNOSIS — I890 Lymphedema, not elsewhere classified: Secondary | ICD-10-CM | POA: Diagnosis not present

## 2023-10-24 DIAGNOSIS — I87321 Chronic venous hypertension (idiopathic) with inflammation of right lower extremity: Secondary | ICD-10-CM | POA: Diagnosis not present

## 2023-10-24 DIAGNOSIS — Z7901 Long term (current) use of anticoagulants: Secondary | ICD-10-CM | POA: Diagnosis not present

## 2023-10-25 ENCOUNTER — Ambulatory Visit (HOSPITAL_BASED_OUTPATIENT_CLINIC_OR_DEPARTMENT_OTHER): Admitting: Internal Medicine

## 2023-10-25 DIAGNOSIS — E782 Mixed hyperlipidemia: Secondary | ICD-10-CM | POA: Diagnosis not present

## 2023-10-25 DIAGNOSIS — I482 Chronic atrial fibrillation, unspecified: Secondary | ICD-10-CM | POA: Diagnosis not present

## 2023-10-25 DIAGNOSIS — F32A Depression, unspecified: Secondary | ICD-10-CM | POA: Diagnosis not present

## 2023-10-25 DIAGNOSIS — I87321 Chronic venous hypertension (idiopathic) with inflammation of right lower extremity: Secondary | ICD-10-CM | POA: Diagnosis not present

## 2023-10-25 DIAGNOSIS — F0394 Unspecified dementia, unspecified severity, with anxiety: Secondary | ICD-10-CM | POA: Diagnosis not present

## 2023-10-25 DIAGNOSIS — I739 Peripheral vascular disease, unspecified: Secondary | ICD-10-CM | POA: Diagnosis not present

## 2023-10-25 DIAGNOSIS — F4321 Adjustment disorder with depressed mood: Secondary | ICD-10-CM | POA: Diagnosis not present

## 2023-10-25 DIAGNOSIS — I890 Lymphedema, not elsewhere classified: Secondary | ICD-10-CM | POA: Diagnosis not present

## 2023-10-25 DIAGNOSIS — R600 Localized edema: Secondary | ICD-10-CM | POA: Diagnosis not present

## 2023-10-25 DIAGNOSIS — F0393 Unspecified dementia, unspecified severity, with mood disturbance: Secondary | ICD-10-CM | POA: Diagnosis not present

## 2023-10-25 DIAGNOSIS — G8929 Other chronic pain: Secondary | ICD-10-CM | POA: Diagnosis not present

## 2023-10-25 DIAGNOSIS — L97811 Non-pressure chronic ulcer of other part of right lower leg limited to breakdown of skin: Secondary | ICD-10-CM | POA: Diagnosis not present

## 2023-10-25 DIAGNOSIS — E559 Vitamin D deficiency, unspecified: Secondary | ICD-10-CM | POA: Diagnosis not present

## 2023-10-25 DIAGNOSIS — Z7901 Long term (current) use of anticoagulants: Secondary | ICD-10-CM | POA: Diagnosis not present

## 2023-10-27 DIAGNOSIS — I482 Chronic atrial fibrillation, unspecified: Secondary | ICD-10-CM | POA: Diagnosis not present

## 2023-10-27 DIAGNOSIS — F0393 Unspecified dementia, unspecified severity, with mood disturbance: Secondary | ICD-10-CM | POA: Diagnosis not present

## 2023-10-27 DIAGNOSIS — F32A Depression, unspecified: Secondary | ICD-10-CM | POA: Diagnosis not present

## 2023-10-27 DIAGNOSIS — I739 Peripheral vascular disease, unspecified: Secondary | ICD-10-CM | POA: Diagnosis not present

## 2023-10-27 DIAGNOSIS — F4321 Adjustment disorder with depressed mood: Secondary | ICD-10-CM | POA: Diagnosis not present

## 2023-10-27 DIAGNOSIS — F0394 Unspecified dementia, unspecified severity, with anxiety: Secondary | ICD-10-CM | POA: Diagnosis not present

## 2023-10-27 DIAGNOSIS — R600 Localized edema: Secondary | ICD-10-CM | POA: Diagnosis not present

## 2023-10-27 DIAGNOSIS — G8929 Other chronic pain: Secondary | ICD-10-CM | POA: Diagnosis not present

## 2023-10-27 DIAGNOSIS — I890 Lymphedema, not elsewhere classified: Secondary | ICD-10-CM | POA: Diagnosis not present

## 2023-10-27 DIAGNOSIS — Z7901 Long term (current) use of anticoagulants: Secondary | ICD-10-CM | POA: Diagnosis not present

## 2023-10-27 DIAGNOSIS — L97811 Non-pressure chronic ulcer of other part of right lower leg limited to breakdown of skin: Secondary | ICD-10-CM | POA: Diagnosis not present

## 2023-10-27 DIAGNOSIS — E782 Mixed hyperlipidemia: Secondary | ICD-10-CM | POA: Diagnosis not present

## 2023-10-27 DIAGNOSIS — I87321 Chronic venous hypertension (idiopathic) with inflammation of right lower extremity: Secondary | ICD-10-CM | POA: Diagnosis not present

## 2023-10-27 DIAGNOSIS — E559 Vitamin D deficiency, unspecified: Secondary | ICD-10-CM | POA: Diagnosis not present

## 2023-10-29 DIAGNOSIS — E782 Mixed hyperlipidemia: Secondary | ICD-10-CM | POA: Diagnosis not present

## 2023-10-29 DIAGNOSIS — Z7901 Long term (current) use of anticoagulants: Secondary | ICD-10-CM | POA: Diagnosis not present

## 2023-10-29 DIAGNOSIS — F32A Depression, unspecified: Secondary | ICD-10-CM | POA: Diagnosis not present

## 2023-10-29 DIAGNOSIS — F0394 Unspecified dementia, unspecified severity, with anxiety: Secondary | ICD-10-CM | POA: Diagnosis not present

## 2023-10-29 DIAGNOSIS — G8929 Other chronic pain: Secondary | ICD-10-CM | POA: Diagnosis not present

## 2023-10-29 DIAGNOSIS — I87321 Chronic venous hypertension (idiopathic) with inflammation of right lower extremity: Secondary | ICD-10-CM | POA: Diagnosis not present

## 2023-10-29 DIAGNOSIS — F0393 Unspecified dementia, unspecified severity, with mood disturbance: Secondary | ICD-10-CM | POA: Diagnosis not present

## 2023-10-29 DIAGNOSIS — F4321 Adjustment disorder with depressed mood: Secondary | ICD-10-CM | POA: Diagnosis not present

## 2023-10-29 DIAGNOSIS — I890 Lymphedema, not elsewhere classified: Secondary | ICD-10-CM | POA: Diagnosis not present

## 2023-10-29 DIAGNOSIS — R600 Localized edema: Secondary | ICD-10-CM | POA: Diagnosis not present

## 2023-10-29 DIAGNOSIS — I482 Chronic atrial fibrillation, unspecified: Secondary | ICD-10-CM | POA: Diagnosis not present

## 2023-10-29 DIAGNOSIS — I739 Peripheral vascular disease, unspecified: Secondary | ICD-10-CM | POA: Diagnosis not present

## 2023-10-29 DIAGNOSIS — E559 Vitamin D deficiency, unspecified: Secondary | ICD-10-CM | POA: Diagnosis not present

## 2023-10-29 DIAGNOSIS — L97811 Non-pressure chronic ulcer of other part of right lower leg limited to breakdown of skin: Secondary | ICD-10-CM | POA: Diagnosis not present

## 2023-10-31 DIAGNOSIS — F4321 Adjustment disorder with depressed mood: Secondary | ICD-10-CM | POA: Diagnosis not present

## 2023-10-31 DIAGNOSIS — I87321 Chronic venous hypertension (idiopathic) with inflammation of right lower extremity: Secondary | ICD-10-CM | POA: Diagnosis not present

## 2023-10-31 DIAGNOSIS — I739 Peripheral vascular disease, unspecified: Secondary | ICD-10-CM | POA: Diagnosis not present

## 2023-10-31 DIAGNOSIS — F32A Depression, unspecified: Secondary | ICD-10-CM | POA: Diagnosis not present

## 2023-10-31 DIAGNOSIS — E782 Mixed hyperlipidemia: Secondary | ICD-10-CM | POA: Diagnosis not present

## 2023-10-31 DIAGNOSIS — R600 Localized edema: Secondary | ICD-10-CM | POA: Diagnosis not present

## 2023-10-31 DIAGNOSIS — I482 Chronic atrial fibrillation, unspecified: Secondary | ICD-10-CM | POA: Diagnosis not present

## 2023-10-31 DIAGNOSIS — E559 Vitamin D deficiency, unspecified: Secondary | ICD-10-CM | POA: Diagnosis not present

## 2023-10-31 DIAGNOSIS — F0393 Unspecified dementia, unspecified severity, with mood disturbance: Secondary | ICD-10-CM | POA: Diagnosis not present

## 2023-10-31 DIAGNOSIS — F0394 Unspecified dementia, unspecified severity, with anxiety: Secondary | ICD-10-CM | POA: Diagnosis not present

## 2023-10-31 DIAGNOSIS — I890 Lymphedema, not elsewhere classified: Secondary | ICD-10-CM | POA: Diagnosis not present

## 2023-10-31 DIAGNOSIS — Z7901 Long term (current) use of anticoagulants: Secondary | ICD-10-CM | POA: Diagnosis not present

## 2023-10-31 DIAGNOSIS — L97811 Non-pressure chronic ulcer of other part of right lower leg limited to breakdown of skin: Secondary | ICD-10-CM | POA: Diagnosis not present

## 2023-10-31 DIAGNOSIS — G8929 Other chronic pain: Secondary | ICD-10-CM | POA: Diagnosis not present

## 2023-11-02 DIAGNOSIS — F32A Depression, unspecified: Secondary | ICD-10-CM | POA: Diagnosis not present

## 2023-11-02 DIAGNOSIS — F4321 Adjustment disorder with depressed mood: Secondary | ICD-10-CM | POA: Diagnosis not present

## 2023-11-02 DIAGNOSIS — E782 Mixed hyperlipidemia: Secondary | ICD-10-CM | POA: Diagnosis not present

## 2023-11-02 DIAGNOSIS — E559 Vitamin D deficiency, unspecified: Secondary | ICD-10-CM | POA: Diagnosis not present

## 2023-11-02 DIAGNOSIS — I87321 Chronic venous hypertension (idiopathic) with inflammation of right lower extremity: Secondary | ICD-10-CM | POA: Diagnosis not present

## 2023-11-02 DIAGNOSIS — F0393 Unspecified dementia, unspecified severity, with mood disturbance: Secondary | ICD-10-CM | POA: Diagnosis not present

## 2023-11-02 DIAGNOSIS — F0394 Unspecified dementia, unspecified severity, with anxiety: Secondary | ICD-10-CM | POA: Diagnosis not present

## 2023-11-02 DIAGNOSIS — L97811 Non-pressure chronic ulcer of other part of right lower leg limited to breakdown of skin: Secondary | ICD-10-CM | POA: Diagnosis not present

## 2023-11-02 DIAGNOSIS — Z7901 Long term (current) use of anticoagulants: Secondary | ICD-10-CM | POA: Diagnosis not present

## 2023-11-02 DIAGNOSIS — I739 Peripheral vascular disease, unspecified: Secondary | ICD-10-CM | POA: Diagnosis not present

## 2023-11-02 DIAGNOSIS — I890 Lymphedema, not elsewhere classified: Secondary | ICD-10-CM | POA: Diagnosis not present

## 2023-11-02 DIAGNOSIS — G8929 Other chronic pain: Secondary | ICD-10-CM | POA: Diagnosis not present

## 2023-11-02 DIAGNOSIS — I482 Chronic atrial fibrillation, unspecified: Secondary | ICD-10-CM | POA: Diagnosis not present

## 2023-11-02 DIAGNOSIS — R600 Localized edema: Secondary | ICD-10-CM | POA: Diagnosis not present

## 2023-11-05 DIAGNOSIS — F0394 Unspecified dementia, unspecified severity, with anxiety: Secondary | ICD-10-CM | POA: Diagnosis not present

## 2023-11-05 DIAGNOSIS — Z7901 Long term (current) use of anticoagulants: Secondary | ICD-10-CM | POA: Diagnosis not present

## 2023-11-05 DIAGNOSIS — F32A Depression, unspecified: Secondary | ICD-10-CM | POA: Diagnosis not present

## 2023-11-05 DIAGNOSIS — G8929 Other chronic pain: Secondary | ICD-10-CM | POA: Diagnosis not present

## 2023-11-05 DIAGNOSIS — I890 Lymphedema, not elsewhere classified: Secondary | ICD-10-CM | POA: Diagnosis not present

## 2023-11-05 DIAGNOSIS — L97811 Non-pressure chronic ulcer of other part of right lower leg limited to breakdown of skin: Secondary | ICD-10-CM | POA: Diagnosis not present

## 2023-11-05 DIAGNOSIS — E782 Mixed hyperlipidemia: Secondary | ICD-10-CM | POA: Diagnosis not present

## 2023-11-05 DIAGNOSIS — R600 Localized edema: Secondary | ICD-10-CM | POA: Diagnosis not present

## 2023-11-05 DIAGNOSIS — F4321 Adjustment disorder with depressed mood: Secondary | ICD-10-CM | POA: Diagnosis not present

## 2023-11-05 DIAGNOSIS — I87321 Chronic venous hypertension (idiopathic) with inflammation of right lower extremity: Secondary | ICD-10-CM | POA: Diagnosis not present

## 2023-11-05 DIAGNOSIS — I482 Chronic atrial fibrillation, unspecified: Secondary | ICD-10-CM | POA: Diagnosis not present

## 2023-11-05 DIAGNOSIS — I739 Peripheral vascular disease, unspecified: Secondary | ICD-10-CM | POA: Diagnosis not present

## 2023-11-05 DIAGNOSIS — F0393 Unspecified dementia, unspecified severity, with mood disturbance: Secondary | ICD-10-CM | POA: Diagnosis not present

## 2023-11-05 DIAGNOSIS — E559 Vitamin D deficiency, unspecified: Secondary | ICD-10-CM | POA: Diagnosis not present

## 2023-11-06 DIAGNOSIS — F32A Depression, unspecified: Secondary | ICD-10-CM | POA: Diagnosis not present

## 2023-11-06 DIAGNOSIS — I87321 Chronic venous hypertension (idiopathic) with inflammation of right lower extremity: Secondary | ICD-10-CM | POA: Diagnosis not present

## 2023-11-06 DIAGNOSIS — R600 Localized edema: Secondary | ICD-10-CM | POA: Diagnosis not present

## 2023-11-06 DIAGNOSIS — I890 Lymphedema, not elsewhere classified: Secondary | ICD-10-CM | POA: Diagnosis not present

## 2023-11-06 DIAGNOSIS — F0394 Unspecified dementia, unspecified severity, with anxiety: Secondary | ICD-10-CM | POA: Diagnosis not present

## 2023-11-06 DIAGNOSIS — I739 Peripheral vascular disease, unspecified: Secondary | ICD-10-CM | POA: Diagnosis not present

## 2023-11-06 DIAGNOSIS — I482 Chronic atrial fibrillation, unspecified: Secondary | ICD-10-CM | POA: Diagnosis not present

## 2023-11-06 DIAGNOSIS — E559 Vitamin D deficiency, unspecified: Secondary | ICD-10-CM | POA: Diagnosis not present

## 2023-11-06 DIAGNOSIS — E782 Mixed hyperlipidemia: Secondary | ICD-10-CM | POA: Diagnosis not present

## 2023-11-06 DIAGNOSIS — F0393 Unspecified dementia, unspecified severity, with mood disturbance: Secondary | ICD-10-CM | POA: Diagnosis not present

## 2023-11-06 DIAGNOSIS — L97811 Non-pressure chronic ulcer of other part of right lower leg limited to breakdown of skin: Secondary | ICD-10-CM | POA: Diagnosis not present

## 2023-11-06 DIAGNOSIS — F4321 Adjustment disorder with depressed mood: Secondary | ICD-10-CM | POA: Diagnosis not present

## 2023-11-06 DIAGNOSIS — G8929 Other chronic pain: Secondary | ICD-10-CM | POA: Diagnosis not present

## 2023-11-06 DIAGNOSIS — Z7901 Long term (current) use of anticoagulants: Secondary | ICD-10-CM | POA: Diagnosis not present

## 2023-11-07 DIAGNOSIS — F0394 Unspecified dementia, unspecified severity, with anxiety: Secondary | ICD-10-CM | POA: Diagnosis not present

## 2023-11-07 DIAGNOSIS — G8929 Other chronic pain: Secondary | ICD-10-CM | POA: Diagnosis not present

## 2023-11-07 DIAGNOSIS — I482 Chronic atrial fibrillation, unspecified: Secondary | ICD-10-CM | POA: Diagnosis not present

## 2023-11-07 DIAGNOSIS — I739 Peripheral vascular disease, unspecified: Secondary | ICD-10-CM | POA: Diagnosis not present

## 2023-11-07 DIAGNOSIS — R600 Localized edema: Secondary | ICD-10-CM | POA: Diagnosis not present

## 2023-11-07 DIAGNOSIS — F4321 Adjustment disorder with depressed mood: Secondary | ICD-10-CM | POA: Diagnosis not present

## 2023-11-07 DIAGNOSIS — I890 Lymphedema, not elsewhere classified: Secondary | ICD-10-CM | POA: Diagnosis not present

## 2023-11-07 DIAGNOSIS — L97811 Non-pressure chronic ulcer of other part of right lower leg limited to breakdown of skin: Secondary | ICD-10-CM | POA: Diagnosis not present

## 2023-11-07 DIAGNOSIS — E782 Mixed hyperlipidemia: Secondary | ICD-10-CM | POA: Diagnosis not present

## 2023-11-07 DIAGNOSIS — Z7901 Long term (current) use of anticoagulants: Secondary | ICD-10-CM | POA: Diagnosis not present

## 2023-11-07 DIAGNOSIS — F0393 Unspecified dementia, unspecified severity, with mood disturbance: Secondary | ICD-10-CM | POA: Diagnosis not present

## 2023-11-07 DIAGNOSIS — E559 Vitamin D deficiency, unspecified: Secondary | ICD-10-CM | POA: Diagnosis not present

## 2023-11-07 DIAGNOSIS — F32A Depression, unspecified: Secondary | ICD-10-CM | POA: Diagnosis not present

## 2023-11-07 DIAGNOSIS — I87321 Chronic venous hypertension (idiopathic) with inflammation of right lower extremity: Secondary | ICD-10-CM | POA: Diagnosis not present

## 2023-11-08 ENCOUNTER — Encounter (HOSPITAL_BASED_OUTPATIENT_CLINIC_OR_DEPARTMENT_OTHER): Attending: Internal Medicine | Admitting: Internal Medicine

## 2023-11-08 DIAGNOSIS — F0390 Unspecified dementia without behavioral disturbance: Secondary | ICD-10-CM | POA: Insufficient documentation

## 2023-11-08 DIAGNOSIS — I87313 Chronic venous hypertension (idiopathic) with ulcer of bilateral lower extremity: Secondary | ICD-10-CM | POA: Diagnosis not present

## 2023-11-08 DIAGNOSIS — I87312 Chronic venous hypertension (idiopathic) with ulcer of left lower extremity: Secondary | ICD-10-CM | POA: Diagnosis not present

## 2023-11-08 DIAGNOSIS — I890 Lymphedema, not elsewhere classified: Secondary | ICD-10-CM | POA: Diagnosis not present

## 2023-11-08 DIAGNOSIS — L97812 Non-pressure chronic ulcer of other part of right lower leg with fat layer exposed: Secondary | ICD-10-CM | POA: Insufficient documentation

## 2023-11-08 DIAGNOSIS — I87311 Chronic venous hypertension (idiopathic) with ulcer of right lower extremity: Secondary | ICD-10-CM | POA: Diagnosis not present

## 2023-11-08 DIAGNOSIS — L97822 Non-pressure chronic ulcer of other part of left lower leg with fat layer exposed: Secondary | ICD-10-CM | POA: Diagnosis not present

## 2023-11-12 DIAGNOSIS — L97811 Non-pressure chronic ulcer of other part of right lower leg limited to breakdown of skin: Secondary | ICD-10-CM | POA: Diagnosis not present

## 2023-11-12 DIAGNOSIS — F411 Generalized anxiety disorder: Secondary | ICD-10-CM | POA: Diagnosis not present

## 2023-11-12 DIAGNOSIS — F331 Major depressive disorder, recurrent, moderate: Secondary | ICD-10-CM | POA: Diagnosis not present

## 2023-11-12 DIAGNOSIS — F0394 Unspecified dementia, unspecified severity, with anxiety: Secondary | ICD-10-CM | POA: Diagnosis not present

## 2023-11-12 DIAGNOSIS — I739 Peripheral vascular disease, unspecified: Secondary | ICD-10-CM | POA: Diagnosis not present

## 2023-11-12 DIAGNOSIS — G300 Alzheimer's disease with early onset: Secondary | ICD-10-CM | POA: Diagnosis not present

## 2023-11-12 DIAGNOSIS — E559 Vitamin D deficiency, unspecified: Secondary | ICD-10-CM | POA: Diagnosis not present

## 2023-11-12 DIAGNOSIS — I890 Lymphedema, not elsewhere classified: Secondary | ICD-10-CM | POA: Diagnosis not present

## 2023-11-12 DIAGNOSIS — F0393 Unspecified dementia, unspecified severity, with mood disturbance: Secondary | ICD-10-CM | POA: Diagnosis not present

## 2023-11-12 DIAGNOSIS — Z7901 Long term (current) use of anticoagulants: Secondary | ICD-10-CM | POA: Diagnosis not present

## 2023-11-12 DIAGNOSIS — G8929 Other chronic pain: Secondary | ICD-10-CM | POA: Diagnosis not present

## 2023-11-12 DIAGNOSIS — R600 Localized edema: Secondary | ICD-10-CM | POA: Diagnosis not present

## 2023-11-12 DIAGNOSIS — I482 Chronic atrial fibrillation, unspecified: Secondary | ICD-10-CM | POA: Diagnosis not present

## 2023-11-12 DIAGNOSIS — F4321 Adjustment disorder with depressed mood: Secondary | ICD-10-CM | POA: Diagnosis not present

## 2023-11-12 DIAGNOSIS — I87321 Chronic venous hypertension (idiopathic) with inflammation of right lower extremity: Secondary | ICD-10-CM | POA: Diagnosis not present

## 2023-11-12 DIAGNOSIS — F32A Depression, unspecified: Secondary | ICD-10-CM | POA: Diagnosis not present

## 2023-11-12 DIAGNOSIS — E782 Mixed hyperlipidemia: Secondary | ICD-10-CM | POA: Diagnosis not present

## 2023-11-13 DIAGNOSIS — L97828 Non-pressure chronic ulcer of other part of left lower leg with other specified severity: Secondary | ICD-10-CM | POA: Diagnosis not present

## 2023-11-13 DIAGNOSIS — E782 Mixed hyperlipidemia: Secondary | ICD-10-CM | POA: Diagnosis not present

## 2023-11-13 DIAGNOSIS — I482 Chronic atrial fibrillation, unspecified: Secondary | ICD-10-CM | POA: Diagnosis not present

## 2023-11-13 DIAGNOSIS — G300 Alzheimer's disease with early onset: Secondary | ICD-10-CM | POA: Diagnosis not present

## 2023-11-13 DIAGNOSIS — I87321 Chronic venous hypertension (idiopathic) with inflammation of right lower extremity: Secondary | ICD-10-CM | POA: Diagnosis not present

## 2023-11-13 DIAGNOSIS — I890 Lymphedema, not elsewhere classified: Secondary | ICD-10-CM | POA: Diagnosis not present

## 2023-11-13 DIAGNOSIS — F32A Depression, unspecified: Secondary | ICD-10-CM | POA: Diagnosis not present

## 2023-11-13 DIAGNOSIS — Z7901 Long term (current) use of anticoagulants: Secondary | ICD-10-CM | POA: Diagnosis not present

## 2023-11-13 DIAGNOSIS — G8929 Other chronic pain: Secondary | ICD-10-CM | POA: Diagnosis not present

## 2023-11-13 DIAGNOSIS — F4321 Adjustment disorder with depressed mood: Secondary | ICD-10-CM | POA: Diagnosis not present

## 2023-11-13 DIAGNOSIS — L97811 Non-pressure chronic ulcer of other part of right lower leg limited to breakdown of skin: Secondary | ICD-10-CM | POA: Diagnosis not present

## 2023-11-13 DIAGNOSIS — I872 Venous insufficiency (chronic) (peripheral): Secondary | ICD-10-CM | POA: Diagnosis not present

## 2023-11-13 DIAGNOSIS — F0394 Unspecified dementia, unspecified severity, with anxiety: Secondary | ICD-10-CM | POA: Diagnosis not present

## 2023-11-13 DIAGNOSIS — R600 Localized edema: Secondary | ICD-10-CM | POA: Diagnosis not present

## 2023-11-13 DIAGNOSIS — F0393 Unspecified dementia, unspecified severity, with mood disturbance: Secondary | ICD-10-CM | POA: Diagnosis not present

## 2023-11-13 DIAGNOSIS — I739 Peripheral vascular disease, unspecified: Secondary | ICD-10-CM | POA: Diagnosis not present

## 2023-11-13 DIAGNOSIS — E559 Vitamin D deficiency, unspecified: Secondary | ICD-10-CM | POA: Diagnosis not present

## 2023-11-14 DIAGNOSIS — F0394 Unspecified dementia, unspecified severity, with anxiety: Secondary | ICD-10-CM | POA: Diagnosis not present

## 2023-11-14 DIAGNOSIS — L97811 Non-pressure chronic ulcer of other part of right lower leg limited to breakdown of skin: Secondary | ICD-10-CM | POA: Diagnosis not present

## 2023-11-14 DIAGNOSIS — Z7901 Long term (current) use of anticoagulants: Secondary | ICD-10-CM | POA: Diagnosis not present

## 2023-11-14 DIAGNOSIS — I87321 Chronic venous hypertension (idiopathic) with inflammation of right lower extremity: Secondary | ICD-10-CM | POA: Diagnosis not present

## 2023-11-14 DIAGNOSIS — F0393 Unspecified dementia, unspecified severity, with mood disturbance: Secondary | ICD-10-CM | POA: Diagnosis not present

## 2023-11-14 DIAGNOSIS — I890 Lymphedema, not elsewhere classified: Secondary | ICD-10-CM | POA: Diagnosis not present

## 2023-11-14 DIAGNOSIS — E782 Mixed hyperlipidemia: Secondary | ICD-10-CM | POA: Diagnosis not present

## 2023-11-14 DIAGNOSIS — R600 Localized edema: Secondary | ICD-10-CM | POA: Diagnosis not present

## 2023-11-14 DIAGNOSIS — G8929 Other chronic pain: Secondary | ICD-10-CM | POA: Diagnosis not present

## 2023-11-14 DIAGNOSIS — F32A Depression, unspecified: Secondary | ICD-10-CM | POA: Diagnosis not present

## 2023-11-14 DIAGNOSIS — I482 Chronic atrial fibrillation, unspecified: Secondary | ICD-10-CM | POA: Diagnosis not present

## 2023-11-14 DIAGNOSIS — E559 Vitamin D deficiency, unspecified: Secondary | ICD-10-CM | POA: Diagnosis not present

## 2023-11-14 DIAGNOSIS — I739 Peripheral vascular disease, unspecified: Secondary | ICD-10-CM | POA: Diagnosis not present

## 2023-11-14 DIAGNOSIS — F4321 Adjustment disorder with depressed mood: Secondary | ICD-10-CM | POA: Diagnosis not present

## 2023-11-16 DIAGNOSIS — L97811 Non-pressure chronic ulcer of other part of right lower leg limited to breakdown of skin: Secondary | ICD-10-CM | POA: Diagnosis not present

## 2023-11-16 DIAGNOSIS — I739 Peripheral vascular disease, unspecified: Secondary | ICD-10-CM | POA: Diagnosis not present

## 2023-11-16 DIAGNOSIS — I87321 Chronic venous hypertension (idiopathic) with inflammation of right lower extremity: Secondary | ICD-10-CM | POA: Diagnosis not present

## 2023-11-16 DIAGNOSIS — G8929 Other chronic pain: Secondary | ICD-10-CM | POA: Diagnosis not present

## 2023-11-16 DIAGNOSIS — I890 Lymphedema, not elsewhere classified: Secondary | ICD-10-CM | POA: Diagnosis not present

## 2023-11-16 DIAGNOSIS — F0394 Unspecified dementia, unspecified severity, with anxiety: Secondary | ICD-10-CM | POA: Diagnosis not present

## 2023-11-16 DIAGNOSIS — F32A Depression, unspecified: Secondary | ICD-10-CM | POA: Diagnosis not present

## 2023-11-16 DIAGNOSIS — R600 Localized edema: Secondary | ICD-10-CM | POA: Diagnosis not present

## 2023-11-16 DIAGNOSIS — E559 Vitamin D deficiency, unspecified: Secondary | ICD-10-CM | POA: Diagnosis not present

## 2023-11-16 DIAGNOSIS — F4321 Adjustment disorder with depressed mood: Secondary | ICD-10-CM | POA: Diagnosis not present

## 2023-11-16 DIAGNOSIS — Z7901 Long term (current) use of anticoagulants: Secondary | ICD-10-CM | POA: Diagnosis not present

## 2023-11-16 DIAGNOSIS — F0393 Unspecified dementia, unspecified severity, with mood disturbance: Secondary | ICD-10-CM | POA: Diagnosis not present

## 2023-11-16 DIAGNOSIS — I482 Chronic atrial fibrillation, unspecified: Secondary | ICD-10-CM | POA: Diagnosis not present

## 2023-11-16 DIAGNOSIS — E782 Mixed hyperlipidemia: Secondary | ICD-10-CM | POA: Diagnosis not present

## 2023-11-19 DIAGNOSIS — G8929 Other chronic pain: Secondary | ICD-10-CM | POA: Diagnosis not present

## 2023-11-19 DIAGNOSIS — E782 Mixed hyperlipidemia: Secondary | ICD-10-CM | POA: Diagnosis not present

## 2023-11-19 DIAGNOSIS — I739 Peripheral vascular disease, unspecified: Secondary | ICD-10-CM | POA: Diagnosis not present

## 2023-11-19 DIAGNOSIS — Z7901 Long term (current) use of anticoagulants: Secondary | ICD-10-CM | POA: Diagnosis not present

## 2023-11-19 DIAGNOSIS — F0393 Unspecified dementia, unspecified severity, with mood disturbance: Secondary | ICD-10-CM | POA: Diagnosis not present

## 2023-11-19 DIAGNOSIS — I87321 Chronic venous hypertension (idiopathic) with inflammation of right lower extremity: Secondary | ICD-10-CM | POA: Diagnosis not present

## 2023-11-19 DIAGNOSIS — F0394 Unspecified dementia, unspecified severity, with anxiety: Secondary | ICD-10-CM | POA: Diagnosis not present

## 2023-11-19 DIAGNOSIS — I890 Lymphedema, not elsewhere classified: Secondary | ICD-10-CM | POA: Diagnosis not present

## 2023-11-19 DIAGNOSIS — L97811 Non-pressure chronic ulcer of other part of right lower leg limited to breakdown of skin: Secondary | ICD-10-CM | POA: Diagnosis not present

## 2023-11-19 DIAGNOSIS — I482 Chronic atrial fibrillation, unspecified: Secondary | ICD-10-CM | POA: Diagnosis not present

## 2023-11-19 DIAGNOSIS — F4321 Adjustment disorder with depressed mood: Secondary | ICD-10-CM | POA: Diagnosis not present

## 2023-11-19 DIAGNOSIS — F32A Depression, unspecified: Secondary | ICD-10-CM | POA: Diagnosis not present

## 2023-11-19 DIAGNOSIS — R600 Localized edema: Secondary | ICD-10-CM | POA: Diagnosis not present

## 2023-11-19 DIAGNOSIS — E559 Vitamin D deficiency, unspecified: Secondary | ICD-10-CM | POA: Diagnosis not present

## 2023-11-20 DIAGNOSIS — R600 Localized edema: Secondary | ICD-10-CM | POA: Diagnosis not present

## 2023-11-20 DIAGNOSIS — I87321 Chronic venous hypertension (idiopathic) with inflammation of right lower extremity: Secondary | ICD-10-CM | POA: Diagnosis not present

## 2023-11-20 DIAGNOSIS — I739 Peripheral vascular disease, unspecified: Secondary | ICD-10-CM | POA: Diagnosis not present

## 2023-11-20 DIAGNOSIS — F4321 Adjustment disorder with depressed mood: Secondary | ICD-10-CM | POA: Diagnosis not present

## 2023-11-20 DIAGNOSIS — I482 Chronic atrial fibrillation, unspecified: Secondary | ICD-10-CM | POA: Diagnosis not present

## 2023-11-20 DIAGNOSIS — L97811 Non-pressure chronic ulcer of other part of right lower leg limited to breakdown of skin: Secondary | ICD-10-CM | POA: Diagnosis not present

## 2023-11-20 DIAGNOSIS — F0394 Unspecified dementia, unspecified severity, with anxiety: Secondary | ICD-10-CM | POA: Diagnosis not present

## 2023-11-20 DIAGNOSIS — F0393 Unspecified dementia, unspecified severity, with mood disturbance: Secondary | ICD-10-CM | POA: Diagnosis not present

## 2023-11-20 DIAGNOSIS — G8929 Other chronic pain: Secondary | ICD-10-CM | POA: Diagnosis not present

## 2023-11-20 DIAGNOSIS — Z7901 Long term (current) use of anticoagulants: Secondary | ICD-10-CM | POA: Diagnosis not present

## 2023-11-20 DIAGNOSIS — F32A Depression, unspecified: Secondary | ICD-10-CM | POA: Diagnosis not present

## 2023-11-20 DIAGNOSIS — I890 Lymphedema, not elsewhere classified: Secondary | ICD-10-CM | POA: Diagnosis not present

## 2023-11-20 DIAGNOSIS — E559 Vitamin D deficiency, unspecified: Secondary | ICD-10-CM | POA: Diagnosis not present

## 2023-11-20 DIAGNOSIS — E782 Mixed hyperlipidemia: Secondary | ICD-10-CM | POA: Diagnosis not present

## 2023-11-21 DIAGNOSIS — F0393 Unspecified dementia, unspecified severity, with mood disturbance: Secondary | ICD-10-CM | POA: Diagnosis not present

## 2023-11-21 DIAGNOSIS — I890 Lymphedema, not elsewhere classified: Secondary | ICD-10-CM | POA: Diagnosis not present

## 2023-11-21 DIAGNOSIS — R600 Localized edema: Secondary | ICD-10-CM | POA: Diagnosis not present

## 2023-11-21 DIAGNOSIS — Z7901 Long term (current) use of anticoagulants: Secondary | ICD-10-CM | POA: Diagnosis not present

## 2023-11-21 DIAGNOSIS — E559 Vitamin D deficiency, unspecified: Secondary | ICD-10-CM | POA: Diagnosis not present

## 2023-11-21 DIAGNOSIS — E782 Mixed hyperlipidemia: Secondary | ICD-10-CM | POA: Diagnosis not present

## 2023-11-21 DIAGNOSIS — F32A Depression, unspecified: Secondary | ICD-10-CM | POA: Diagnosis not present

## 2023-11-21 DIAGNOSIS — I482 Chronic atrial fibrillation, unspecified: Secondary | ICD-10-CM | POA: Diagnosis not present

## 2023-11-21 DIAGNOSIS — I739 Peripheral vascular disease, unspecified: Secondary | ICD-10-CM | POA: Diagnosis not present

## 2023-11-21 DIAGNOSIS — F4321 Adjustment disorder with depressed mood: Secondary | ICD-10-CM | POA: Diagnosis not present

## 2023-11-21 DIAGNOSIS — L97811 Non-pressure chronic ulcer of other part of right lower leg limited to breakdown of skin: Secondary | ICD-10-CM | POA: Diagnosis not present

## 2023-11-21 DIAGNOSIS — G8929 Other chronic pain: Secondary | ICD-10-CM | POA: Diagnosis not present

## 2023-11-21 DIAGNOSIS — F0394 Unspecified dementia, unspecified severity, with anxiety: Secondary | ICD-10-CM | POA: Diagnosis not present

## 2023-11-21 DIAGNOSIS — I87321 Chronic venous hypertension (idiopathic) with inflammation of right lower extremity: Secondary | ICD-10-CM | POA: Diagnosis not present

## 2023-11-23 DIAGNOSIS — F32A Depression, unspecified: Secondary | ICD-10-CM | POA: Diagnosis not present

## 2023-11-23 DIAGNOSIS — L97811 Non-pressure chronic ulcer of other part of right lower leg limited to breakdown of skin: Secondary | ICD-10-CM | POA: Diagnosis not present

## 2023-11-23 DIAGNOSIS — Z7901 Long term (current) use of anticoagulants: Secondary | ICD-10-CM | POA: Diagnosis not present

## 2023-11-23 DIAGNOSIS — I739 Peripheral vascular disease, unspecified: Secondary | ICD-10-CM | POA: Diagnosis not present

## 2023-11-23 DIAGNOSIS — R600 Localized edema: Secondary | ICD-10-CM | POA: Diagnosis not present

## 2023-11-23 DIAGNOSIS — F0393 Unspecified dementia, unspecified severity, with mood disturbance: Secondary | ICD-10-CM | POA: Diagnosis not present

## 2023-11-23 DIAGNOSIS — E782 Mixed hyperlipidemia: Secondary | ICD-10-CM | POA: Diagnosis not present

## 2023-11-23 DIAGNOSIS — F4321 Adjustment disorder with depressed mood: Secondary | ICD-10-CM | POA: Diagnosis not present

## 2023-11-23 DIAGNOSIS — I482 Chronic atrial fibrillation, unspecified: Secondary | ICD-10-CM | POA: Diagnosis not present

## 2023-11-23 DIAGNOSIS — E559 Vitamin D deficiency, unspecified: Secondary | ICD-10-CM | POA: Diagnosis not present

## 2023-11-23 DIAGNOSIS — I890 Lymphedema, not elsewhere classified: Secondary | ICD-10-CM | POA: Diagnosis not present

## 2023-11-23 DIAGNOSIS — F0394 Unspecified dementia, unspecified severity, with anxiety: Secondary | ICD-10-CM | POA: Diagnosis not present

## 2023-11-23 DIAGNOSIS — G8929 Other chronic pain: Secondary | ICD-10-CM | POA: Diagnosis not present

## 2023-11-23 DIAGNOSIS — I87321 Chronic venous hypertension (idiopathic) with inflammation of right lower extremity: Secondary | ICD-10-CM | POA: Diagnosis not present

## 2023-11-26 DIAGNOSIS — I739 Peripheral vascular disease, unspecified: Secondary | ICD-10-CM | POA: Diagnosis not present

## 2023-11-26 DIAGNOSIS — R600 Localized edema: Secondary | ICD-10-CM | POA: Diagnosis not present

## 2023-11-26 DIAGNOSIS — F0393 Unspecified dementia, unspecified severity, with mood disturbance: Secondary | ICD-10-CM | POA: Diagnosis not present

## 2023-11-26 DIAGNOSIS — F4321 Adjustment disorder with depressed mood: Secondary | ICD-10-CM | POA: Diagnosis not present

## 2023-11-26 DIAGNOSIS — I890 Lymphedema, not elsewhere classified: Secondary | ICD-10-CM | POA: Diagnosis not present

## 2023-11-26 DIAGNOSIS — E559 Vitamin D deficiency, unspecified: Secondary | ICD-10-CM | POA: Diagnosis not present

## 2023-11-26 DIAGNOSIS — I482 Chronic atrial fibrillation, unspecified: Secondary | ICD-10-CM | POA: Diagnosis not present

## 2023-11-26 DIAGNOSIS — E782 Mixed hyperlipidemia: Secondary | ICD-10-CM | POA: Diagnosis not present

## 2023-11-26 DIAGNOSIS — G8929 Other chronic pain: Secondary | ICD-10-CM | POA: Diagnosis not present

## 2023-11-26 DIAGNOSIS — Z7901 Long term (current) use of anticoagulants: Secondary | ICD-10-CM | POA: Diagnosis not present

## 2023-11-26 DIAGNOSIS — F32A Depression, unspecified: Secondary | ICD-10-CM | POA: Diagnosis not present

## 2023-11-26 DIAGNOSIS — F0394 Unspecified dementia, unspecified severity, with anxiety: Secondary | ICD-10-CM | POA: Diagnosis not present

## 2023-11-26 DIAGNOSIS — I87321 Chronic venous hypertension (idiopathic) with inflammation of right lower extremity: Secondary | ICD-10-CM | POA: Diagnosis not present

## 2023-11-26 DIAGNOSIS — L97811 Non-pressure chronic ulcer of other part of right lower leg limited to breakdown of skin: Secondary | ICD-10-CM | POA: Diagnosis not present

## 2023-11-27 DIAGNOSIS — F32A Depression, unspecified: Secondary | ICD-10-CM | POA: Diagnosis not present

## 2023-11-27 DIAGNOSIS — L97811 Non-pressure chronic ulcer of other part of right lower leg limited to breakdown of skin: Secondary | ICD-10-CM | POA: Diagnosis not present

## 2023-11-27 DIAGNOSIS — I87321 Chronic venous hypertension (idiopathic) with inflammation of right lower extremity: Secondary | ICD-10-CM | POA: Diagnosis not present

## 2023-11-27 DIAGNOSIS — E782 Mixed hyperlipidemia: Secondary | ICD-10-CM | POA: Diagnosis not present

## 2023-11-27 DIAGNOSIS — I890 Lymphedema, not elsewhere classified: Secondary | ICD-10-CM | POA: Diagnosis not present

## 2023-11-27 DIAGNOSIS — I482 Chronic atrial fibrillation, unspecified: Secondary | ICD-10-CM | POA: Diagnosis not present

## 2023-11-27 DIAGNOSIS — F0394 Unspecified dementia, unspecified severity, with anxiety: Secondary | ICD-10-CM | POA: Diagnosis not present

## 2023-11-27 DIAGNOSIS — I739 Peripheral vascular disease, unspecified: Secondary | ICD-10-CM | POA: Diagnosis not present

## 2023-11-27 DIAGNOSIS — F4321 Adjustment disorder with depressed mood: Secondary | ICD-10-CM | POA: Diagnosis not present

## 2023-11-27 DIAGNOSIS — G8929 Other chronic pain: Secondary | ICD-10-CM | POA: Diagnosis not present

## 2023-11-27 DIAGNOSIS — F0393 Unspecified dementia, unspecified severity, with mood disturbance: Secondary | ICD-10-CM | POA: Diagnosis not present

## 2023-11-27 DIAGNOSIS — R600 Localized edema: Secondary | ICD-10-CM | POA: Diagnosis not present

## 2023-11-27 DIAGNOSIS — Z7901 Long term (current) use of anticoagulants: Secondary | ICD-10-CM | POA: Diagnosis not present

## 2023-11-27 DIAGNOSIS — E559 Vitamin D deficiency, unspecified: Secondary | ICD-10-CM | POA: Diagnosis not present

## 2023-11-28 DIAGNOSIS — F32A Depression, unspecified: Secondary | ICD-10-CM | POA: Diagnosis not present

## 2023-11-28 DIAGNOSIS — G8929 Other chronic pain: Secondary | ICD-10-CM | POA: Diagnosis not present

## 2023-11-28 DIAGNOSIS — F0394 Unspecified dementia, unspecified severity, with anxiety: Secondary | ICD-10-CM | POA: Diagnosis not present

## 2023-11-28 DIAGNOSIS — E559 Vitamin D deficiency, unspecified: Secondary | ICD-10-CM | POA: Diagnosis not present

## 2023-11-28 DIAGNOSIS — I87321 Chronic venous hypertension (idiopathic) with inflammation of right lower extremity: Secondary | ICD-10-CM | POA: Diagnosis not present

## 2023-11-28 DIAGNOSIS — F0393 Unspecified dementia, unspecified severity, with mood disturbance: Secondary | ICD-10-CM | POA: Diagnosis not present

## 2023-11-28 DIAGNOSIS — L97811 Non-pressure chronic ulcer of other part of right lower leg limited to breakdown of skin: Secondary | ICD-10-CM | POA: Diagnosis not present

## 2023-11-28 DIAGNOSIS — I482 Chronic atrial fibrillation, unspecified: Secondary | ICD-10-CM | POA: Diagnosis not present

## 2023-11-28 DIAGNOSIS — F4321 Adjustment disorder with depressed mood: Secondary | ICD-10-CM | POA: Diagnosis not present

## 2023-11-28 DIAGNOSIS — E782 Mixed hyperlipidemia: Secondary | ICD-10-CM | POA: Diagnosis not present

## 2023-11-28 DIAGNOSIS — R600 Localized edema: Secondary | ICD-10-CM | POA: Diagnosis not present

## 2023-11-28 DIAGNOSIS — I890 Lymphedema, not elsewhere classified: Secondary | ICD-10-CM | POA: Diagnosis not present

## 2023-11-28 DIAGNOSIS — Z7901 Long term (current) use of anticoagulants: Secondary | ICD-10-CM | POA: Diagnosis not present

## 2023-11-28 DIAGNOSIS — I739 Peripheral vascular disease, unspecified: Secondary | ICD-10-CM | POA: Diagnosis not present

## 2023-11-30 DIAGNOSIS — Z7901 Long term (current) use of anticoagulants: Secondary | ICD-10-CM | POA: Diagnosis not present

## 2023-11-30 DIAGNOSIS — E782 Mixed hyperlipidemia: Secondary | ICD-10-CM | POA: Diagnosis not present

## 2023-11-30 DIAGNOSIS — I890 Lymphedema, not elsewhere classified: Secondary | ICD-10-CM | POA: Diagnosis not present

## 2023-11-30 DIAGNOSIS — I482 Chronic atrial fibrillation, unspecified: Secondary | ICD-10-CM | POA: Diagnosis not present

## 2023-11-30 DIAGNOSIS — G8929 Other chronic pain: Secondary | ICD-10-CM | POA: Diagnosis not present

## 2023-11-30 DIAGNOSIS — F0394 Unspecified dementia, unspecified severity, with anxiety: Secondary | ICD-10-CM | POA: Diagnosis not present

## 2023-11-30 DIAGNOSIS — I87321 Chronic venous hypertension (idiopathic) with inflammation of right lower extremity: Secondary | ICD-10-CM | POA: Diagnosis not present

## 2023-11-30 DIAGNOSIS — I739 Peripheral vascular disease, unspecified: Secondary | ICD-10-CM | POA: Diagnosis not present

## 2023-11-30 DIAGNOSIS — F4321 Adjustment disorder with depressed mood: Secondary | ICD-10-CM | POA: Diagnosis not present

## 2023-11-30 DIAGNOSIS — E559 Vitamin D deficiency, unspecified: Secondary | ICD-10-CM | POA: Diagnosis not present

## 2023-11-30 DIAGNOSIS — R600 Localized edema: Secondary | ICD-10-CM | POA: Diagnosis not present

## 2023-11-30 DIAGNOSIS — L97811 Non-pressure chronic ulcer of other part of right lower leg limited to breakdown of skin: Secondary | ICD-10-CM | POA: Diagnosis not present

## 2023-11-30 DIAGNOSIS — F32A Depression, unspecified: Secondary | ICD-10-CM | POA: Diagnosis not present

## 2023-11-30 DIAGNOSIS — F0393 Unspecified dementia, unspecified severity, with mood disturbance: Secondary | ICD-10-CM | POA: Diagnosis not present

## 2023-12-03 DIAGNOSIS — E559 Vitamin D deficiency, unspecified: Secondary | ICD-10-CM | POA: Diagnosis not present

## 2023-12-03 DIAGNOSIS — F0393 Unspecified dementia, unspecified severity, with mood disturbance: Secondary | ICD-10-CM | POA: Diagnosis not present

## 2023-12-03 DIAGNOSIS — F4321 Adjustment disorder with depressed mood: Secondary | ICD-10-CM | POA: Diagnosis not present

## 2023-12-03 DIAGNOSIS — I482 Chronic atrial fibrillation, unspecified: Secondary | ICD-10-CM | POA: Diagnosis not present

## 2023-12-03 DIAGNOSIS — L97811 Non-pressure chronic ulcer of other part of right lower leg limited to breakdown of skin: Secondary | ICD-10-CM | POA: Diagnosis not present

## 2023-12-03 DIAGNOSIS — G8929 Other chronic pain: Secondary | ICD-10-CM | POA: Diagnosis not present

## 2023-12-03 DIAGNOSIS — I890 Lymphedema, not elsewhere classified: Secondary | ICD-10-CM | POA: Diagnosis not present

## 2023-12-03 DIAGNOSIS — I87321 Chronic venous hypertension (idiopathic) with inflammation of right lower extremity: Secondary | ICD-10-CM | POA: Diagnosis not present

## 2023-12-03 DIAGNOSIS — F0394 Unspecified dementia, unspecified severity, with anxiety: Secondary | ICD-10-CM | POA: Diagnosis not present

## 2023-12-03 DIAGNOSIS — E782 Mixed hyperlipidemia: Secondary | ICD-10-CM | POA: Diagnosis not present

## 2023-12-03 DIAGNOSIS — R600 Localized edema: Secondary | ICD-10-CM | POA: Diagnosis not present

## 2023-12-03 DIAGNOSIS — Z7901 Long term (current) use of anticoagulants: Secondary | ICD-10-CM | POA: Diagnosis not present

## 2023-12-03 DIAGNOSIS — F32A Depression, unspecified: Secondary | ICD-10-CM | POA: Diagnosis not present

## 2023-12-03 DIAGNOSIS — I739 Peripheral vascular disease, unspecified: Secondary | ICD-10-CM | POA: Diagnosis not present

## 2023-12-04 DIAGNOSIS — I482 Chronic atrial fibrillation, unspecified: Secondary | ICD-10-CM | POA: Diagnosis not present

## 2023-12-04 DIAGNOSIS — Z7901 Long term (current) use of anticoagulants: Secondary | ICD-10-CM | POA: Diagnosis not present

## 2023-12-04 DIAGNOSIS — F0394 Unspecified dementia, unspecified severity, with anxiety: Secondary | ICD-10-CM | POA: Diagnosis not present

## 2023-12-04 DIAGNOSIS — R600 Localized edema: Secondary | ICD-10-CM | POA: Diagnosis not present

## 2023-12-04 DIAGNOSIS — E782 Mixed hyperlipidemia: Secondary | ICD-10-CM | POA: Diagnosis not present

## 2023-12-04 DIAGNOSIS — I739 Peripheral vascular disease, unspecified: Secondary | ICD-10-CM | POA: Diagnosis not present

## 2023-12-04 DIAGNOSIS — G8929 Other chronic pain: Secondary | ICD-10-CM | POA: Diagnosis not present

## 2023-12-04 DIAGNOSIS — L97811 Non-pressure chronic ulcer of other part of right lower leg limited to breakdown of skin: Secondary | ICD-10-CM | POA: Diagnosis not present

## 2023-12-04 DIAGNOSIS — I890 Lymphedema, not elsewhere classified: Secondary | ICD-10-CM | POA: Diagnosis not present

## 2023-12-04 DIAGNOSIS — F32A Depression, unspecified: Secondary | ICD-10-CM | POA: Diagnosis not present

## 2023-12-04 DIAGNOSIS — F0393 Unspecified dementia, unspecified severity, with mood disturbance: Secondary | ICD-10-CM | POA: Diagnosis not present

## 2023-12-04 DIAGNOSIS — E559 Vitamin D deficiency, unspecified: Secondary | ICD-10-CM | POA: Diagnosis not present

## 2023-12-04 DIAGNOSIS — F4321 Adjustment disorder with depressed mood: Secondary | ICD-10-CM | POA: Diagnosis not present

## 2023-12-04 DIAGNOSIS — I87321 Chronic venous hypertension (idiopathic) with inflammation of right lower extremity: Secondary | ICD-10-CM | POA: Diagnosis not present

## 2023-12-05 DIAGNOSIS — F0393 Unspecified dementia, unspecified severity, with mood disturbance: Secondary | ICD-10-CM | POA: Diagnosis not present

## 2023-12-05 DIAGNOSIS — I87321 Chronic venous hypertension (idiopathic) with inflammation of right lower extremity: Secondary | ICD-10-CM | POA: Diagnosis not present

## 2023-12-05 DIAGNOSIS — G8929 Other chronic pain: Secondary | ICD-10-CM | POA: Diagnosis not present

## 2023-12-05 DIAGNOSIS — I739 Peripheral vascular disease, unspecified: Secondary | ICD-10-CM | POA: Diagnosis not present

## 2023-12-05 DIAGNOSIS — E559 Vitamin D deficiency, unspecified: Secondary | ICD-10-CM | POA: Diagnosis not present

## 2023-12-05 DIAGNOSIS — I890 Lymphedema, not elsewhere classified: Secondary | ICD-10-CM | POA: Diagnosis not present

## 2023-12-05 DIAGNOSIS — R600 Localized edema: Secondary | ICD-10-CM | POA: Diagnosis not present

## 2023-12-05 DIAGNOSIS — F32A Depression, unspecified: Secondary | ICD-10-CM | POA: Diagnosis not present

## 2023-12-05 DIAGNOSIS — F4321 Adjustment disorder with depressed mood: Secondary | ICD-10-CM | POA: Diagnosis not present

## 2023-12-05 DIAGNOSIS — F0394 Unspecified dementia, unspecified severity, with anxiety: Secondary | ICD-10-CM | POA: Diagnosis not present

## 2023-12-05 DIAGNOSIS — I482 Chronic atrial fibrillation, unspecified: Secondary | ICD-10-CM | POA: Diagnosis not present

## 2023-12-05 DIAGNOSIS — Z7901 Long term (current) use of anticoagulants: Secondary | ICD-10-CM | POA: Diagnosis not present

## 2023-12-05 DIAGNOSIS — E782 Mixed hyperlipidemia: Secondary | ICD-10-CM | POA: Diagnosis not present

## 2023-12-05 DIAGNOSIS — L97811 Non-pressure chronic ulcer of other part of right lower leg limited to breakdown of skin: Secondary | ICD-10-CM | POA: Diagnosis not present

## 2023-12-07 DIAGNOSIS — I482 Chronic atrial fibrillation, unspecified: Secondary | ICD-10-CM | POA: Diagnosis not present

## 2023-12-07 DIAGNOSIS — E782 Mixed hyperlipidemia: Secondary | ICD-10-CM | POA: Diagnosis not present

## 2023-12-07 DIAGNOSIS — L97811 Non-pressure chronic ulcer of other part of right lower leg limited to breakdown of skin: Secondary | ICD-10-CM | POA: Diagnosis not present

## 2023-12-07 DIAGNOSIS — G8929 Other chronic pain: Secondary | ICD-10-CM | POA: Diagnosis not present

## 2023-12-07 DIAGNOSIS — R600 Localized edema: Secondary | ICD-10-CM | POA: Diagnosis not present

## 2023-12-07 DIAGNOSIS — I890 Lymphedema, not elsewhere classified: Secondary | ICD-10-CM | POA: Diagnosis not present

## 2023-12-07 DIAGNOSIS — E559 Vitamin D deficiency, unspecified: Secondary | ICD-10-CM | POA: Diagnosis not present

## 2023-12-07 DIAGNOSIS — F0393 Unspecified dementia, unspecified severity, with mood disturbance: Secondary | ICD-10-CM | POA: Diagnosis not present

## 2023-12-07 DIAGNOSIS — I739 Peripheral vascular disease, unspecified: Secondary | ICD-10-CM | POA: Diagnosis not present

## 2023-12-07 DIAGNOSIS — Z7901 Long term (current) use of anticoagulants: Secondary | ICD-10-CM | POA: Diagnosis not present

## 2023-12-07 DIAGNOSIS — I87321 Chronic venous hypertension (idiopathic) with inflammation of right lower extremity: Secondary | ICD-10-CM | POA: Diagnosis not present

## 2023-12-07 DIAGNOSIS — F0394 Unspecified dementia, unspecified severity, with anxiety: Secondary | ICD-10-CM | POA: Diagnosis not present

## 2023-12-07 DIAGNOSIS — F32A Depression, unspecified: Secondary | ICD-10-CM | POA: Diagnosis not present

## 2023-12-07 DIAGNOSIS — F4321 Adjustment disorder with depressed mood: Secondary | ICD-10-CM | POA: Diagnosis not present

## 2023-12-10 DIAGNOSIS — F0394 Unspecified dementia, unspecified severity, with anxiety: Secondary | ICD-10-CM | POA: Diagnosis not present

## 2023-12-10 DIAGNOSIS — E559 Vitamin D deficiency, unspecified: Secondary | ICD-10-CM | POA: Diagnosis not present

## 2023-12-10 DIAGNOSIS — E782 Mixed hyperlipidemia: Secondary | ICD-10-CM | POA: Diagnosis not present

## 2023-12-10 DIAGNOSIS — I890 Lymphedema, not elsewhere classified: Secondary | ICD-10-CM | POA: Diagnosis not present

## 2023-12-10 DIAGNOSIS — F0393 Unspecified dementia, unspecified severity, with mood disturbance: Secondary | ICD-10-CM | POA: Diagnosis not present

## 2023-12-10 DIAGNOSIS — I87321 Chronic venous hypertension (idiopathic) with inflammation of right lower extremity: Secondary | ICD-10-CM | POA: Diagnosis not present

## 2023-12-10 DIAGNOSIS — L97811 Non-pressure chronic ulcer of other part of right lower leg limited to breakdown of skin: Secondary | ICD-10-CM | POA: Diagnosis not present

## 2023-12-10 DIAGNOSIS — R600 Localized edema: Secondary | ICD-10-CM | POA: Diagnosis not present

## 2023-12-10 DIAGNOSIS — G8929 Other chronic pain: Secondary | ICD-10-CM | POA: Diagnosis not present

## 2023-12-10 DIAGNOSIS — F32A Depression, unspecified: Secondary | ICD-10-CM | POA: Diagnosis not present

## 2023-12-10 DIAGNOSIS — I739 Peripheral vascular disease, unspecified: Secondary | ICD-10-CM | POA: Diagnosis not present

## 2023-12-10 DIAGNOSIS — Z7901 Long term (current) use of anticoagulants: Secondary | ICD-10-CM | POA: Diagnosis not present

## 2023-12-10 DIAGNOSIS — I482 Chronic atrial fibrillation, unspecified: Secondary | ICD-10-CM | POA: Diagnosis not present

## 2023-12-10 DIAGNOSIS — F4321 Adjustment disorder with depressed mood: Secondary | ICD-10-CM | POA: Diagnosis not present

## 2023-12-12 DIAGNOSIS — G8929 Other chronic pain: Secondary | ICD-10-CM | POA: Diagnosis not present

## 2023-12-12 DIAGNOSIS — L97811 Non-pressure chronic ulcer of other part of right lower leg limited to breakdown of skin: Secondary | ICD-10-CM | POA: Diagnosis not present

## 2023-12-12 DIAGNOSIS — R600 Localized edema: Secondary | ICD-10-CM | POA: Diagnosis not present

## 2023-12-12 DIAGNOSIS — Z7901 Long term (current) use of anticoagulants: Secondary | ICD-10-CM | POA: Diagnosis not present

## 2023-12-12 DIAGNOSIS — I87321 Chronic venous hypertension (idiopathic) with inflammation of right lower extremity: Secondary | ICD-10-CM | POA: Diagnosis not present

## 2023-12-12 DIAGNOSIS — E782 Mixed hyperlipidemia: Secondary | ICD-10-CM | POA: Diagnosis not present

## 2023-12-12 DIAGNOSIS — I482 Chronic atrial fibrillation, unspecified: Secondary | ICD-10-CM | POA: Diagnosis not present

## 2023-12-12 DIAGNOSIS — F4321 Adjustment disorder with depressed mood: Secondary | ICD-10-CM | POA: Diagnosis not present

## 2023-12-12 DIAGNOSIS — F32A Depression, unspecified: Secondary | ICD-10-CM | POA: Diagnosis not present

## 2023-12-12 DIAGNOSIS — E559 Vitamin D deficiency, unspecified: Secondary | ICD-10-CM | POA: Diagnosis not present

## 2023-12-12 DIAGNOSIS — F0394 Unspecified dementia, unspecified severity, with anxiety: Secondary | ICD-10-CM | POA: Diagnosis not present

## 2023-12-12 DIAGNOSIS — I890 Lymphedema, not elsewhere classified: Secondary | ICD-10-CM | POA: Diagnosis not present

## 2023-12-12 DIAGNOSIS — F0393 Unspecified dementia, unspecified severity, with mood disturbance: Secondary | ICD-10-CM | POA: Diagnosis not present

## 2023-12-12 DIAGNOSIS — I739 Peripheral vascular disease, unspecified: Secondary | ICD-10-CM | POA: Diagnosis not present

## 2023-12-13 ENCOUNTER — Encounter (HOSPITAL_BASED_OUTPATIENT_CLINIC_OR_DEPARTMENT_OTHER): Admitting: Internal Medicine

## 2023-12-14 DIAGNOSIS — I482 Chronic atrial fibrillation, unspecified: Secondary | ICD-10-CM | POA: Diagnosis not present

## 2023-12-14 DIAGNOSIS — I890 Lymphedema, not elsewhere classified: Secondary | ICD-10-CM | POA: Diagnosis not present

## 2023-12-14 DIAGNOSIS — I87321 Chronic venous hypertension (idiopathic) with inflammation of right lower extremity: Secondary | ICD-10-CM | POA: Diagnosis not present

## 2023-12-14 DIAGNOSIS — F0394 Unspecified dementia, unspecified severity, with anxiety: Secondary | ICD-10-CM | POA: Diagnosis not present

## 2023-12-14 DIAGNOSIS — L97811 Non-pressure chronic ulcer of other part of right lower leg limited to breakdown of skin: Secondary | ICD-10-CM | POA: Diagnosis not present

## 2023-12-14 DIAGNOSIS — E782 Mixed hyperlipidemia: Secondary | ICD-10-CM | POA: Diagnosis not present

## 2023-12-14 DIAGNOSIS — F0393 Unspecified dementia, unspecified severity, with mood disturbance: Secondary | ICD-10-CM | POA: Diagnosis not present

## 2023-12-14 DIAGNOSIS — R600 Localized edema: Secondary | ICD-10-CM | POA: Diagnosis not present

## 2023-12-14 DIAGNOSIS — I739 Peripheral vascular disease, unspecified: Secondary | ICD-10-CM | POA: Diagnosis not present

## 2023-12-14 DIAGNOSIS — F32A Depression, unspecified: Secondary | ICD-10-CM | POA: Diagnosis not present

## 2023-12-14 DIAGNOSIS — Z7901 Long term (current) use of anticoagulants: Secondary | ICD-10-CM | POA: Diagnosis not present

## 2023-12-14 DIAGNOSIS — G8929 Other chronic pain: Secondary | ICD-10-CM | POA: Diagnosis not present

## 2023-12-14 DIAGNOSIS — F4321 Adjustment disorder with depressed mood: Secondary | ICD-10-CM | POA: Diagnosis not present

## 2023-12-14 DIAGNOSIS — E559 Vitamin D deficiency, unspecified: Secondary | ICD-10-CM | POA: Diagnosis not present

## 2023-12-17 DIAGNOSIS — F0393 Unspecified dementia, unspecified severity, with mood disturbance: Secondary | ICD-10-CM | POA: Diagnosis not present

## 2023-12-17 DIAGNOSIS — Z7901 Long term (current) use of anticoagulants: Secondary | ICD-10-CM | POA: Diagnosis not present

## 2023-12-17 DIAGNOSIS — E782 Mixed hyperlipidemia: Secondary | ICD-10-CM | POA: Diagnosis not present

## 2023-12-17 DIAGNOSIS — I482 Chronic atrial fibrillation, unspecified: Secondary | ICD-10-CM | POA: Diagnosis not present

## 2023-12-17 DIAGNOSIS — F4321 Adjustment disorder with depressed mood: Secondary | ICD-10-CM | POA: Diagnosis not present

## 2023-12-17 DIAGNOSIS — F32A Depression, unspecified: Secondary | ICD-10-CM | POA: Diagnosis not present

## 2023-12-17 DIAGNOSIS — L97811 Non-pressure chronic ulcer of other part of right lower leg limited to breakdown of skin: Secondary | ICD-10-CM | POA: Diagnosis not present

## 2023-12-17 DIAGNOSIS — F0394 Unspecified dementia, unspecified severity, with anxiety: Secondary | ICD-10-CM | POA: Diagnosis not present

## 2023-12-17 DIAGNOSIS — I87321 Chronic venous hypertension (idiopathic) with inflammation of right lower extremity: Secondary | ICD-10-CM | POA: Diagnosis not present

## 2023-12-17 DIAGNOSIS — I890 Lymphedema, not elsewhere classified: Secondary | ICD-10-CM | POA: Diagnosis not present

## 2023-12-17 DIAGNOSIS — R600 Localized edema: Secondary | ICD-10-CM | POA: Diagnosis not present

## 2023-12-17 DIAGNOSIS — E559 Vitamin D deficiency, unspecified: Secondary | ICD-10-CM | POA: Diagnosis not present

## 2023-12-17 DIAGNOSIS — I739 Peripheral vascular disease, unspecified: Secondary | ICD-10-CM | POA: Diagnosis not present

## 2023-12-17 DIAGNOSIS — G8929 Other chronic pain: Secondary | ICD-10-CM | POA: Diagnosis not present

## 2023-12-19 DIAGNOSIS — I482 Chronic atrial fibrillation, unspecified: Secondary | ICD-10-CM | POA: Diagnosis not present

## 2023-12-19 DIAGNOSIS — I739 Peripheral vascular disease, unspecified: Secondary | ICD-10-CM | POA: Diagnosis not present

## 2023-12-19 DIAGNOSIS — F0393 Unspecified dementia, unspecified severity, with mood disturbance: Secondary | ICD-10-CM | POA: Diagnosis not present

## 2023-12-19 DIAGNOSIS — F4321 Adjustment disorder with depressed mood: Secondary | ICD-10-CM | POA: Diagnosis not present

## 2023-12-19 DIAGNOSIS — L97811 Non-pressure chronic ulcer of other part of right lower leg limited to breakdown of skin: Secondary | ICD-10-CM | POA: Diagnosis not present

## 2023-12-19 DIAGNOSIS — I890 Lymphedema, not elsewhere classified: Secondary | ICD-10-CM | POA: Diagnosis not present

## 2023-12-19 DIAGNOSIS — I87321 Chronic venous hypertension (idiopathic) with inflammation of right lower extremity: Secondary | ICD-10-CM | POA: Diagnosis not present

## 2023-12-19 DIAGNOSIS — R600 Localized edema: Secondary | ICD-10-CM | POA: Diagnosis not present

## 2023-12-19 DIAGNOSIS — F32A Depression, unspecified: Secondary | ICD-10-CM | POA: Diagnosis not present

## 2023-12-19 DIAGNOSIS — Z7901 Long term (current) use of anticoagulants: Secondary | ICD-10-CM | POA: Diagnosis not present

## 2023-12-19 DIAGNOSIS — F0394 Unspecified dementia, unspecified severity, with anxiety: Secondary | ICD-10-CM | POA: Diagnosis not present

## 2023-12-19 DIAGNOSIS — E559 Vitamin D deficiency, unspecified: Secondary | ICD-10-CM | POA: Diagnosis not present

## 2023-12-19 DIAGNOSIS — G8929 Other chronic pain: Secondary | ICD-10-CM | POA: Diagnosis not present

## 2023-12-19 DIAGNOSIS — E782 Mixed hyperlipidemia: Secondary | ICD-10-CM | POA: Diagnosis not present

## 2023-12-21 DIAGNOSIS — R600 Localized edema: Secondary | ICD-10-CM | POA: Diagnosis not present

## 2023-12-21 DIAGNOSIS — I890 Lymphedema, not elsewhere classified: Secondary | ICD-10-CM | POA: Diagnosis not present

## 2023-12-21 DIAGNOSIS — L97811 Non-pressure chronic ulcer of other part of right lower leg limited to breakdown of skin: Secondary | ICD-10-CM | POA: Diagnosis not present

## 2023-12-21 DIAGNOSIS — E559 Vitamin D deficiency, unspecified: Secondary | ICD-10-CM | POA: Diagnosis not present

## 2023-12-21 DIAGNOSIS — F0394 Unspecified dementia, unspecified severity, with anxiety: Secondary | ICD-10-CM | POA: Diagnosis not present

## 2023-12-21 DIAGNOSIS — I739 Peripheral vascular disease, unspecified: Secondary | ICD-10-CM | POA: Diagnosis not present

## 2023-12-21 DIAGNOSIS — G8929 Other chronic pain: Secondary | ICD-10-CM | POA: Diagnosis not present

## 2023-12-21 DIAGNOSIS — E782 Mixed hyperlipidemia: Secondary | ICD-10-CM | POA: Diagnosis not present

## 2023-12-21 DIAGNOSIS — F0393 Unspecified dementia, unspecified severity, with mood disturbance: Secondary | ICD-10-CM | POA: Diagnosis not present

## 2023-12-21 DIAGNOSIS — Z7901 Long term (current) use of anticoagulants: Secondary | ICD-10-CM | POA: Diagnosis not present

## 2023-12-21 DIAGNOSIS — I87321 Chronic venous hypertension (idiopathic) with inflammation of right lower extremity: Secondary | ICD-10-CM | POA: Diagnosis not present

## 2023-12-21 DIAGNOSIS — F32A Depression, unspecified: Secondary | ICD-10-CM | POA: Diagnosis not present

## 2023-12-21 DIAGNOSIS — I482 Chronic atrial fibrillation, unspecified: Secondary | ICD-10-CM | POA: Diagnosis not present

## 2023-12-21 DIAGNOSIS — F4321 Adjustment disorder with depressed mood: Secondary | ICD-10-CM | POA: Diagnosis not present

## 2023-12-24 ENCOUNTER — Ambulatory Visit: Admitting: Podiatry

## 2023-12-24 DIAGNOSIS — F32A Depression, unspecified: Secondary | ICD-10-CM | POA: Diagnosis not present

## 2023-12-24 DIAGNOSIS — L97811 Non-pressure chronic ulcer of other part of right lower leg limited to breakdown of skin: Secondary | ICD-10-CM | POA: Diagnosis not present

## 2023-12-24 DIAGNOSIS — F0393 Unspecified dementia, unspecified severity, with mood disturbance: Secondary | ICD-10-CM | POA: Diagnosis not present

## 2023-12-24 DIAGNOSIS — G8929 Other chronic pain: Secondary | ICD-10-CM | POA: Diagnosis not present

## 2023-12-24 DIAGNOSIS — R600 Localized edema: Secondary | ICD-10-CM | POA: Diagnosis not present

## 2023-12-24 DIAGNOSIS — F4321 Adjustment disorder with depressed mood: Secondary | ICD-10-CM | POA: Diagnosis not present

## 2023-12-24 DIAGNOSIS — I482 Chronic atrial fibrillation, unspecified: Secondary | ICD-10-CM | POA: Diagnosis not present

## 2023-12-24 DIAGNOSIS — Z7901 Long term (current) use of anticoagulants: Secondary | ICD-10-CM | POA: Diagnosis not present

## 2023-12-24 DIAGNOSIS — E559 Vitamin D deficiency, unspecified: Secondary | ICD-10-CM | POA: Diagnosis not present

## 2023-12-24 DIAGNOSIS — E782 Mixed hyperlipidemia: Secondary | ICD-10-CM | POA: Diagnosis not present

## 2023-12-24 DIAGNOSIS — I739 Peripheral vascular disease, unspecified: Secondary | ICD-10-CM | POA: Diagnosis not present

## 2023-12-24 DIAGNOSIS — I890 Lymphedema, not elsewhere classified: Secondary | ICD-10-CM | POA: Diagnosis not present

## 2023-12-24 DIAGNOSIS — I87321 Chronic venous hypertension (idiopathic) with inflammation of right lower extremity: Secondary | ICD-10-CM | POA: Diagnosis not present

## 2023-12-24 DIAGNOSIS — F0394 Unspecified dementia, unspecified severity, with anxiety: Secondary | ICD-10-CM | POA: Diagnosis not present

## 2023-12-26 DIAGNOSIS — F0394 Unspecified dementia, unspecified severity, with anxiety: Secondary | ICD-10-CM | POA: Diagnosis not present

## 2023-12-26 DIAGNOSIS — F32A Depression, unspecified: Secondary | ICD-10-CM | POA: Diagnosis not present

## 2023-12-26 DIAGNOSIS — Z7901 Long term (current) use of anticoagulants: Secondary | ICD-10-CM | POA: Diagnosis not present

## 2023-12-26 DIAGNOSIS — I87321 Chronic venous hypertension (idiopathic) with inflammation of right lower extremity: Secondary | ICD-10-CM | POA: Diagnosis not present

## 2023-12-26 DIAGNOSIS — I890 Lymphedema, not elsewhere classified: Secondary | ICD-10-CM | POA: Diagnosis not present

## 2023-12-26 DIAGNOSIS — F4321 Adjustment disorder with depressed mood: Secondary | ICD-10-CM | POA: Diagnosis not present

## 2023-12-26 DIAGNOSIS — E559 Vitamin D deficiency, unspecified: Secondary | ICD-10-CM | POA: Diagnosis not present

## 2023-12-26 DIAGNOSIS — F0393 Unspecified dementia, unspecified severity, with mood disturbance: Secondary | ICD-10-CM | POA: Diagnosis not present

## 2023-12-26 DIAGNOSIS — G8929 Other chronic pain: Secondary | ICD-10-CM | POA: Diagnosis not present

## 2023-12-26 DIAGNOSIS — I739 Peripheral vascular disease, unspecified: Secondary | ICD-10-CM | POA: Diagnosis not present

## 2023-12-26 DIAGNOSIS — L97811 Non-pressure chronic ulcer of other part of right lower leg limited to breakdown of skin: Secondary | ICD-10-CM | POA: Diagnosis not present

## 2023-12-26 DIAGNOSIS — R600 Localized edema: Secondary | ICD-10-CM | POA: Diagnosis not present

## 2023-12-26 DIAGNOSIS — E782 Mixed hyperlipidemia: Secondary | ICD-10-CM | POA: Diagnosis not present

## 2023-12-26 DIAGNOSIS — I482 Chronic atrial fibrillation, unspecified: Secondary | ICD-10-CM | POA: Diagnosis not present

## 2023-12-27 DIAGNOSIS — R21 Rash and other nonspecific skin eruption: Secondary | ICD-10-CM | POA: Diagnosis not present

## 2023-12-27 DIAGNOSIS — B029 Zoster without complications: Secondary | ICD-10-CM | POA: Diagnosis not present

## 2023-12-28 DIAGNOSIS — I87321 Chronic venous hypertension (idiopathic) with inflammation of right lower extremity: Secondary | ICD-10-CM | POA: Diagnosis not present

## 2023-12-28 DIAGNOSIS — L97811 Non-pressure chronic ulcer of other part of right lower leg limited to breakdown of skin: Secondary | ICD-10-CM | POA: Diagnosis not present

## 2023-12-28 DIAGNOSIS — F0393 Unspecified dementia, unspecified severity, with mood disturbance: Secondary | ICD-10-CM | POA: Diagnosis not present

## 2023-12-28 DIAGNOSIS — F0394 Unspecified dementia, unspecified severity, with anxiety: Secondary | ICD-10-CM | POA: Diagnosis not present

## 2023-12-28 DIAGNOSIS — I739 Peripheral vascular disease, unspecified: Secondary | ICD-10-CM | POA: Diagnosis not present

## 2023-12-28 DIAGNOSIS — E782 Mixed hyperlipidemia: Secondary | ICD-10-CM | POA: Diagnosis not present

## 2023-12-28 DIAGNOSIS — R600 Localized edema: Secondary | ICD-10-CM | POA: Diagnosis not present

## 2023-12-28 DIAGNOSIS — G8929 Other chronic pain: Secondary | ICD-10-CM | POA: Diagnosis not present

## 2023-12-28 DIAGNOSIS — I482 Chronic atrial fibrillation, unspecified: Secondary | ICD-10-CM | POA: Diagnosis not present

## 2023-12-28 DIAGNOSIS — I890 Lymphedema, not elsewhere classified: Secondary | ICD-10-CM | POA: Diagnosis not present

## 2023-12-28 DIAGNOSIS — Z7901 Long term (current) use of anticoagulants: Secondary | ICD-10-CM | POA: Diagnosis not present

## 2023-12-28 DIAGNOSIS — E559 Vitamin D deficiency, unspecified: Secondary | ICD-10-CM | POA: Diagnosis not present

## 2023-12-28 DIAGNOSIS — F32A Depression, unspecified: Secondary | ICD-10-CM | POA: Diagnosis not present

## 2023-12-28 DIAGNOSIS — F4321 Adjustment disorder with depressed mood: Secondary | ICD-10-CM | POA: Diagnosis not present

## 2023-12-31 DIAGNOSIS — I890 Lymphedema, not elsewhere classified: Secondary | ICD-10-CM | POA: Diagnosis not present

## 2023-12-31 DIAGNOSIS — F0394 Unspecified dementia, unspecified severity, with anxiety: Secondary | ICD-10-CM | POA: Diagnosis not present

## 2023-12-31 DIAGNOSIS — F4321 Adjustment disorder with depressed mood: Secondary | ICD-10-CM | POA: Diagnosis not present

## 2023-12-31 DIAGNOSIS — E559 Vitamin D deficiency, unspecified: Secondary | ICD-10-CM | POA: Diagnosis not present

## 2023-12-31 DIAGNOSIS — R600 Localized edema: Secondary | ICD-10-CM | POA: Diagnosis not present

## 2023-12-31 DIAGNOSIS — I739 Peripheral vascular disease, unspecified: Secondary | ICD-10-CM | POA: Diagnosis not present

## 2023-12-31 DIAGNOSIS — F0393 Unspecified dementia, unspecified severity, with mood disturbance: Secondary | ICD-10-CM | POA: Diagnosis not present

## 2023-12-31 DIAGNOSIS — I87321 Chronic venous hypertension (idiopathic) with inflammation of right lower extremity: Secondary | ICD-10-CM | POA: Diagnosis not present

## 2023-12-31 DIAGNOSIS — E782 Mixed hyperlipidemia: Secondary | ICD-10-CM | POA: Diagnosis not present

## 2023-12-31 DIAGNOSIS — Z7901 Long term (current) use of anticoagulants: Secondary | ICD-10-CM | POA: Diagnosis not present

## 2023-12-31 DIAGNOSIS — G8929 Other chronic pain: Secondary | ICD-10-CM | POA: Diagnosis not present

## 2023-12-31 DIAGNOSIS — I482 Chronic atrial fibrillation, unspecified: Secondary | ICD-10-CM | POA: Diagnosis not present

## 2023-12-31 DIAGNOSIS — L97811 Non-pressure chronic ulcer of other part of right lower leg limited to breakdown of skin: Secondary | ICD-10-CM | POA: Diagnosis not present

## 2023-12-31 DIAGNOSIS — F32A Depression, unspecified: Secondary | ICD-10-CM | POA: Diagnosis not present

## 2024-01-01 DIAGNOSIS — F0393 Unspecified dementia, unspecified severity, with mood disturbance: Secondary | ICD-10-CM | POA: Diagnosis not present

## 2024-01-01 DIAGNOSIS — R600 Localized edema: Secondary | ICD-10-CM | POA: Diagnosis not present

## 2024-01-01 DIAGNOSIS — I87321 Chronic venous hypertension (idiopathic) with inflammation of right lower extremity: Secondary | ICD-10-CM | POA: Diagnosis not present

## 2024-01-01 DIAGNOSIS — I739 Peripheral vascular disease, unspecified: Secondary | ICD-10-CM | POA: Diagnosis not present

## 2024-01-01 DIAGNOSIS — F4321 Adjustment disorder with depressed mood: Secondary | ICD-10-CM | POA: Diagnosis not present

## 2024-01-01 DIAGNOSIS — L97811 Non-pressure chronic ulcer of other part of right lower leg limited to breakdown of skin: Secondary | ICD-10-CM | POA: Diagnosis not present

## 2024-01-01 DIAGNOSIS — Z7901 Long term (current) use of anticoagulants: Secondary | ICD-10-CM | POA: Diagnosis not present

## 2024-01-01 DIAGNOSIS — E782 Mixed hyperlipidemia: Secondary | ICD-10-CM | POA: Diagnosis not present

## 2024-01-01 DIAGNOSIS — I482 Chronic atrial fibrillation, unspecified: Secondary | ICD-10-CM | POA: Diagnosis not present

## 2024-01-01 DIAGNOSIS — E559 Vitamin D deficiency, unspecified: Secondary | ICD-10-CM | POA: Diagnosis not present

## 2024-01-01 DIAGNOSIS — F32A Depression, unspecified: Secondary | ICD-10-CM | POA: Diagnosis not present

## 2024-01-01 DIAGNOSIS — I890 Lymphedema, not elsewhere classified: Secondary | ICD-10-CM | POA: Diagnosis not present

## 2024-01-01 DIAGNOSIS — F0394 Unspecified dementia, unspecified severity, with anxiety: Secondary | ICD-10-CM | POA: Diagnosis not present

## 2024-01-01 DIAGNOSIS — G8929 Other chronic pain: Secondary | ICD-10-CM | POA: Diagnosis not present

## 2024-01-03 DIAGNOSIS — I739 Peripheral vascular disease, unspecified: Secondary | ICD-10-CM | POA: Diagnosis not present

## 2024-01-03 DIAGNOSIS — I872 Venous insufficiency (chronic) (peripheral): Secondary | ICD-10-CM | POA: Diagnosis not present

## 2024-01-03 DIAGNOSIS — G8929 Other chronic pain: Secondary | ICD-10-CM | POA: Diagnosis not present

## 2024-01-03 DIAGNOSIS — I890 Lymphedema, not elsewhere classified: Secondary | ICD-10-CM | POA: Diagnosis not present

## 2024-01-03 DIAGNOSIS — B029 Zoster without complications: Secondary | ICD-10-CM | POA: Diagnosis not present

## 2024-01-04 DIAGNOSIS — R600 Localized edema: Secondary | ICD-10-CM | POA: Diagnosis not present

## 2024-01-04 DIAGNOSIS — I482 Chronic atrial fibrillation, unspecified: Secondary | ICD-10-CM | POA: Diagnosis not present

## 2024-01-04 DIAGNOSIS — L97811 Non-pressure chronic ulcer of other part of right lower leg limited to breakdown of skin: Secondary | ICD-10-CM | POA: Diagnosis not present

## 2024-01-04 DIAGNOSIS — F4321 Adjustment disorder with depressed mood: Secondary | ICD-10-CM | POA: Diagnosis not present

## 2024-01-04 DIAGNOSIS — I739 Peripheral vascular disease, unspecified: Secondary | ICD-10-CM | POA: Diagnosis not present

## 2024-01-04 DIAGNOSIS — F32A Depression, unspecified: Secondary | ICD-10-CM | POA: Diagnosis not present

## 2024-01-04 DIAGNOSIS — E559 Vitamin D deficiency, unspecified: Secondary | ICD-10-CM | POA: Diagnosis not present

## 2024-01-04 DIAGNOSIS — E782 Mixed hyperlipidemia: Secondary | ICD-10-CM | POA: Diagnosis not present

## 2024-01-04 DIAGNOSIS — G8929 Other chronic pain: Secondary | ICD-10-CM | POA: Diagnosis not present

## 2024-01-04 DIAGNOSIS — F0393 Unspecified dementia, unspecified severity, with mood disturbance: Secondary | ICD-10-CM | POA: Diagnosis not present

## 2024-01-04 DIAGNOSIS — Z7901 Long term (current) use of anticoagulants: Secondary | ICD-10-CM | POA: Diagnosis not present

## 2024-01-04 DIAGNOSIS — I890 Lymphedema, not elsewhere classified: Secondary | ICD-10-CM | POA: Diagnosis not present

## 2024-01-04 DIAGNOSIS — I87321 Chronic venous hypertension (idiopathic) with inflammation of right lower extremity: Secondary | ICD-10-CM | POA: Diagnosis not present

## 2024-01-04 DIAGNOSIS — F0394 Unspecified dementia, unspecified severity, with anxiety: Secondary | ICD-10-CM | POA: Diagnosis not present

## 2024-01-06 DIAGNOSIS — E559 Vitamin D deficiency, unspecified: Secondary | ICD-10-CM | POA: Diagnosis not present

## 2024-01-06 DIAGNOSIS — I739 Peripheral vascular disease, unspecified: Secondary | ICD-10-CM | POA: Diagnosis not present

## 2024-01-06 DIAGNOSIS — R600 Localized edema: Secondary | ICD-10-CM | POA: Diagnosis not present

## 2024-01-06 DIAGNOSIS — F4321 Adjustment disorder with depressed mood: Secondary | ICD-10-CM | POA: Diagnosis not present

## 2024-01-06 DIAGNOSIS — F0393 Unspecified dementia, unspecified severity, with mood disturbance: Secondary | ICD-10-CM | POA: Diagnosis not present

## 2024-01-06 DIAGNOSIS — I890 Lymphedema, not elsewhere classified: Secondary | ICD-10-CM | POA: Diagnosis not present

## 2024-01-06 DIAGNOSIS — F0394 Unspecified dementia, unspecified severity, with anxiety: Secondary | ICD-10-CM | POA: Diagnosis not present

## 2024-01-06 DIAGNOSIS — L97811 Non-pressure chronic ulcer of other part of right lower leg limited to breakdown of skin: Secondary | ICD-10-CM | POA: Diagnosis not present

## 2024-01-06 DIAGNOSIS — F32A Depression, unspecified: Secondary | ICD-10-CM | POA: Diagnosis not present

## 2024-01-06 DIAGNOSIS — G8929 Other chronic pain: Secondary | ICD-10-CM | POA: Diagnosis not present

## 2024-01-06 DIAGNOSIS — I482 Chronic atrial fibrillation, unspecified: Secondary | ICD-10-CM | POA: Diagnosis not present

## 2024-01-06 DIAGNOSIS — I87321 Chronic venous hypertension (idiopathic) with inflammation of right lower extremity: Secondary | ICD-10-CM | POA: Diagnosis not present

## 2024-01-06 DIAGNOSIS — Z7901 Long term (current) use of anticoagulants: Secondary | ICD-10-CM | POA: Diagnosis not present

## 2024-01-07 DIAGNOSIS — F32A Depression, unspecified: Secondary | ICD-10-CM | POA: Diagnosis not present

## 2024-01-07 DIAGNOSIS — G8929 Other chronic pain: Secondary | ICD-10-CM | POA: Diagnosis not present

## 2024-01-07 DIAGNOSIS — F4321 Adjustment disorder with depressed mood: Secondary | ICD-10-CM | POA: Diagnosis not present

## 2024-01-07 DIAGNOSIS — I739 Peripheral vascular disease, unspecified: Secondary | ICD-10-CM | POA: Diagnosis not present

## 2024-01-07 DIAGNOSIS — E559 Vitamin D deficiency, unspecified: Secondary | ICD-10-CM | POA: Diagnosis not present

## 2024-01-07 DIAGNOSIS — R600 Localized edema: Secondary | ICD-10-CM | POA: Diagnosis not present

## 2024-01-07 DIAGNOSIS — E782 Mixed hyperlipidemia: Secondary | ICD-10-CM | POA: Diagnosis not present

## 2024-01-07 DIAGNOSIS — I482 Chronic atrial fibrillation, unspecified: Secondary | ICD-10-CM | POA: Diagnosis not present

## 2024-01-07 DIAGNOSIS — I890 Lymphedema, not elsewhere classified: Secondary | ICD-10-CM | POA: Diagnosis not present

## 2024-01-07 DIAGNOSIS — F0393 Unspecified dementia, unspecified severity, with mood disturbance: Secondary | ICD-10-CM | POA: Diagnosis not present

## 2024-01-07 DIAGNOSIS — Z7901 Long term (current) use of anticoagulants: Secondary | ICD-10-CM | POA: Diagnosis not present

## 2024-01-07 DIAGNOSIS — I87321 Chronic venous hypertension (idiopathic) with inflammation of right lower extremity: Secondary | ICD-10-CM | POA: Diagnosis not present

## 2024-01-07 DIAGNOSIS — F0394 Unspecified dementia, unspecified severity, with anxiety: Secondary | ICD-10-CM | POA: Diagnosis not present

## 2024-01-07 DIAGNOSIS — L97811 Non-pressure chronic ulcer of other part of right lower leg limited to breakdown of skin: Secondary | ICD-10-CM | POA: Diagnosis not present

## 2024-01-08 ENCOUNTER — Other Ambulatory Visit: Payer: Self-pay

## 2024-01-08 ENCOUNTER — Encounter (HOSPITAL_COMMUNITY): Payer: Self-pay

## 2024-01-08 ENCOUNTER — Emergency Department (HOSPITAL_COMMUNITY)
Admission: EM | Admit: 2024-01-08 | Discharge: 2024-01-08 | Discharge status: Against Medical Advice | Attending: Nurse Practitioner | Admitting: Nurse Practitioner

## 2024-01-08 DIAGNOSIS — F0390 Unspecified dementia without behavioral disturbance: Secondary | ICD-10-CM | POA: Insufficient documentation

## 2024-01-08 DIAGNOSIS — Z5321 Procedure and treatment not carried out due to patient leaving prior to being seen by health care provider: Secondary | ICD-10-CM | POA: Diagnosis not present

## 2024-01-08 DIAGNOSIS — I10 Essential (primary) hypertension: Secondary | ICD-10-CM | POA: Diagnosis not present

## 2024-01-08 DIAGNOSIS — L089 Local infection of the skin and subcutaneous tissue, unspecified: Secondary | ICD-10-CM | POA: Diagnosis not present

## 2024-01-08 DIAGNOSIS — I890 Lymphedema, not elsewhere classified: Secondary | ICD-10-CM | POA: Insufficient documentation

## 2024-01-08 HISTORY — DX: Unspecified dementia, unspecified severity, without behavioral disturbance, psychotic disturbance, mood disturbance, and anxiety: F03.90

## 2024-01-08 LAB — BASIC METABOLIC PANEL WITH GFR
Anion gap: 12 (ref 5–15)
BUN: 16 mg/dL (ref 8–23)
CO2: 27 mmol/L (ref 22–32)
Calcium: 9.5 mg/dL (ref 8.9–10.3)
Chloride: 101 mmol/L (ref 98–111)
Creatinine, Ser: 1.12 mg/dL (ref 0.61–1.24)
GFR, Estimated: >60 mL/min (ref >60)
Glucose, Bld: 110 mg/dL — ABNORMAL HIGH (ref 70–99)
Potassium: 4.3 mmol/L (ref 3.5–5.1)
Sodium: 141 mmol/L (ref 135–145)

## 2024-01-08 LAB — CBC WITH DIFFERENTIAL/PLATELET
Abs Immature Granulocytes: 0.02 K/uL (ref 0.00–0.07)
Basophils Absolute: 0.1 K/uL (ref 0.0–0.1)
Basophils Relative: 1 %
Eosinophils Absolute: 0.2 K/uL (ref 0.0–0.5)
Eosinophils Relative: 3 %
HCT: 47.3 % (ref 39.0–52.0)
Hemoglobin: 14.7 g/dL (ref 13.0–17.0)
Immature Granulocytes: 0 %
Lymphocytes Relative: 23 %
Lymphs Abs: 1.4 K/uL (ref 0.7–4.0)
MCH: 27.9 pg (ref 26.0–34.0)
MCHC: 31.1 g/dL (ref 30.0–36.0)
MCV: 89.8 fL (ref 80.0–100.0)
Monocytes Absolute: 0.5 K/uL (ref 0.1–1.0)
Monocytes Relative: 8 %
Neutro Abs: 4.1 K/uL (ref 1.7–7.7)
Neutrophils Relative %: 65 %
Platelets: 200 K/uL (ref 150–400)
RBC: 5.27 MIL/uL (ref 4.22–5.81)
RDW: 16.3 % — ABNORMAL HIGH (ref 11.5–15.5)
WBC: 6.3 K/uL (ref 4.0–10.5)
nRBC: 0 % (ref 0.0–0.2)

## 2024-01-08 NOTE — ED Triage Notes (Signed)
 Patient BIB [facility] from [facility]. Patient seen 01/06/24 with maggots in both feet. Staff at facility was trying to clean the wound and found 2 larva eggs today. Has dementia.

## 2024-01-08 NOTE — ED Provider Triage Note (Signed)
 Emergency Medicine Provider Triage Evaluation Note  [name] [name] , a 83 y.o. male  was evaluated in triage.  Pt presents from [facility] with family. Patient with history of lymphedema of lower extremities. Wound care visits daily. Recent shingles infection. Wound care noted several maggots on toes two days ago. Additional larvae noted again today on toes. Patient sent to ED for evaluation  Review of Systems  Positive: Dementia. Lymphedema.  Negative: Fever. Chills.   Physical Exam  BP (!) 140/98   Pulse 84   Temp 97.9 F (36.6 C) (Oral)   Resp 19   Ht 5' 11 (1.803 m)   Wt 96 kg   SpO2 100%   BMI 29.52 kg/m  Gen:   Awake, no distress   Resp:  Normal effort  MSK:   In wheelchair. Legs wrapped Other:  Injected sclera. Onychomycosis appearance of toenails  Medical Decision Making  Medically screening exam initiated at 3:50 PM.  Appropriate orders placed.  Mitchael Luckey was informed that the remainder of the evaluation will be completed by another provider, this initial triage assessment does not replace that evaluation, and the importance of remaining in the ED until their evaluation is complete.     Claudene Lenis, NP 01/08/24 [phone]

## 2024-01-08 NOTE — ED Notes (Signed)
 PT left due to wait time. Triage RN made aware.

## 2024-01-09 DIAGNOSIS — G8929 Other chronic pain: Secondary | ICD-10-CM | POA: Diagnosis not present

## 2024-01-09 DIAGNOSIS — I739 Peripheral vascular disease, unspecified: Secondary | ICD-10-CM | POA: Diagnosis not present

## 2024-01-09 DIAGNOSIS — E559 Vitamin D deficiency, unspecified: Secondary | ICD-10-CM | POA: Diagnosis not present

## 2024-01-09 DIAGNOSIS — I482 Chronic atrial fibrillation, unspecified: Secondary | ICD-10-CM | POA: Diagnosis not present

## 2024-01-09 DIAGNOSIS — F0393 Unspecified dementia, unspecified severity, with mood disturbance: Secondary | ICD-10-CM | POA: Diagnosis not present

## 2024-01-09 DIAGNOSIS — I87321 Chronic venous hypertension (idiopathic) with inflammation of right lower extremity: Secondary | ICD-10-CM | POA: Diagnosis not present

## 2024-01-09 DIAGNOSIS — I890 Lymphedema, not elsewhere classified: Secondary | ICD-10-CM | POA: Diagnosis not present

## 2024-01-09 DIAGNOSIS — F0394 Unspecified dementia, unspecified severity, with anxiety: Secondary | ICD-10-CM | POA: Diagnosis not present

## 2024-01-09 DIAGNOSIS — L97811 Non-pressure chronic ulcer of other part of right lower leg limited to breakdown of skin: Secondary | ICD-10-CM | POA: Diagnosis not present

## 2024-01-09 DIAGNOSIS — F32A Depression, unspecified: Secondary | ICD-10-CM | POA: Diagnosis not present

## 2024-01-09 DIAGNOSIS — F4321 Adjustment disorder with depressed mood: Secondary | ICD-10-CM | POA: Diagnosis not present

## 2024-01-09 DIAGNOSIS — R600 Localized edema: Secondary | ICD-10-CM | POA: Diagnosis not present

## 2024-01-09 DIAGNOSIS — Z7901 Long term (current) use of anticoagulants: Secondary | ICD-10-CM | POA: Diagnosis not present

## 2024-01-09 DIAGNOSIS — R21 Rash and other nonspecific skin eruption: Secondary | ICD-10-CM | POA: Diagnosis not present

## 2024-01-09 DIAGNOSIS — E782 Mixed hyperlipidemia: Secondary | ICD-10-CM | POA: Diagnosis not present

## 2024-01-10 DIAGNOSIS — H10023 Other mucopurulent conjunctivitis, bilateral: Secondary | ICD-10-CM | POA: Diagnosis not present

## 2024-01-11 DIAGNOSIS — I87321 Chronic venous hypertension (idiopathic) with inflammation of right lower extremity: Secondary | ICD-10-CM | POA: Diagnosis not present

## 2024-01-11 DIAGNOSIS — F0393 Unspecified dementia, unspecified severity, with mood disturbance: Secondary | ICD-10-CM | POA: Diagnosis not present

## 2024-01-11 DIAGNOSIS — L97811 Non-pressure chronic ulcer of other part of right lower leg limited to breakdown of skin: Secondary | ICD-10-CM | POA: Diagnosis not present

## 2024-01-11 DIAGNOSIS — M79675 Pain in left toe(s): Secondary | ICD-10-CM | POA: Diagnosis not present

## 2024-01-11 DIAGNOSIS — I739 Peripheral vascular disease, unspecified: Secondary | ICD-10-CM | POA: Diagnosis not present

## 2024-01-11 DIAGNOSIS — I890 Lymphedema, not elsewhere classified: Secondary | ICD-10-CM | POA: Diagnosis not present

## 2024-01-11 DIAGNOSIS — Z7901 Long term (current) use of anticoagulants: Secondary | ICD-10-CM | POA: Diagnosis not present

## 2024-01-11 DIAGNOSIS — E559 Vitamin D deficiency, unspecified: Secondary | ICD-10-CM | POA: Diagnosis not present

## 2024-01-11 DIAGNOSIS — F0394 Unspecified dementia, unspecified severity, with anxiety: Secondary | ICD-10-CM | POA: Diagnosis not present

## 2024-01-11 DIAGNOSIS — R600 Localized edema: Secondary | ICD-10-CM | POA: Diagnosis not present

## 2024-01-11 DIAGNOSIS — F4321 Adjustment disorder with depressed mood: Secondary | ICD-10-CM | POA: Diagnosis not present

## 2024-01-11 DIAGNOSIS — E782 Mixed hyperlipidemia: Secondary | ICD-10-CM | POA: Diagnosis not present

## 2024-01-11 DIAGNOSIS — I482 Chronic atrial fibrillation, unspecified: Secondary | ICD-10-CM | POA: Diagnosis not present

## 2024-01-11 DIAGNOSIS — G8929 Other chronic pain: Secondary | ICD-10-CM | POA: Diagnosis not present

## 2024-01-11 DIAGNOSIS — F32A Depression, unspecified: Secondary | ICD-10-CM | POA: Diagnosis not present

## 2024-01-12 ENCOUNTER — Emergency Department (HOSPITAL_BASED_OUTPATIENT_CLINIC_OR_DEPARTMENT_OTHER)
Admission: EM | Admit: 2024-01-12 | Discharge: 2024-01-12 | Disposition: A | Discharge status: Home / Self Care | Attending: Emergency Medicine | Admitting: Emergency Medicine

## 2024-01-12 ENCOUNTER — Encounter (HOSPITAL_BASED_OUTPATIENT_CLINIC_OR_DEPARTMENT_OTHER): Payer: Self-pay | Admitting: Emergency Medicine

## 2024-01-12 ENCOUNTER — Other Ambulatory Visit: Payer: Self-pay

## 2024-01-12 DIAGNOSIS — I4891 Unspecified atrial fibrillation: Secondary | ICD-10-CM | POA: Insufficient documentation

## 2024-01-12 DIAGNOSIS — I890 Lymphedema, not elsewhere classified: Secondary | ICD-10-CM | POA: Insufficient documentation

## 2024-01-12 DIAGNOSIS — Z4800 Encounter for change or removal of nonsurgical wound dressing: Secondary | ICD-10-CM | POA: Diagnosis not present

## 2024-01-12 DIAGNOSIS — Z7901 Long term (current) use of anticoagulants: Secondary | ICD-10-CM | POA: Diagnosis not present

## 2024-01-12 DIAGNOSIS — F0390 Unspecified dementia without behavioral disturbance: Secondary | ICD-10-CM | POA: Insufficient documentation

## 2024-01-12 DIAGNOSIS — L03116 Cellulitis of left lower limb: Secondary | ICD-10-CM | POA: Insufficient documentation

## 2024-01-12 DIAGNOSIS — I2510 Atherosclerotic heart disease of native coronary artery without angina pectoris: Secondary | ICD-10-CM | POA: Insufficient documentation

## 2024-01-12 DIAGNOSIS — L97521 Non-pressure chronic ulcer of other part of left foot limited to breakdown of skin: Secondary | ICD-10-CM | POA: Diagnosis not present

## 2024-01-12 DIAGNOSIS — Z5189 Encounter for other specified aftercare: Secondary | ICD-10-CM

## 2024-01-12 LAB — CBC WITH DIFFERENTIAL/PLATELET
Abs Immature Granulocytes: 0.02 K/uL (ref 0.00–0.07)
Basophils Absolute: 0 K/uL (ref 0.0–0.1)
Basophils Relative: 1 %
Eosinophils Absolute: 0.2 K/uL (ref 0.0–0.5)
Eosinophils Relative: 4 %
HCT: 42.6 % (ref 39.0–52.0)
Hemoglobin: 14 g/dL (ref 13.0–17.0)
Immature Granulocytes: 0 %
Lymphocytes Relative: 20 %
Lymphs Abs: 1.1 K/uL (ref 0.7–4.0)
MCH: 29 pg (ref 26.0–34.0)
MCHC: 32.9 g/dL (ref 30.0–36.0)
MCV: 88.2 fL (ref 80.0–100.0)
Monocytes Absolute: 0.4 K/uL (ref 0.1–1.0)
Monocytes Relative: 8 %
Neutro Abs: 3.6 K/uL (ref 1.7–7.7)
Neutrophils Relative %: 67 %
Platelets: 160 K/uL (ref 150–400)
RBC: 4.83 MIL/uL (ref 4.22–5.81)
RDW: 16.2 % — ABNORMAL HIGH (ref 11.5–15.5)
WBC: 5.3 K/uL (ref 4.0–10.5)
nRBC: 0 % (ref 0.0–0.2)

## 2024-01-12 LAB — COMPREHENSIVE METABOLIC PANEL WITH GFR
ALT: 6 U/L (ref 0–44)
AST: 20 U/L (ref 15–41)
Albumin: 3.9 g/dL (ref 3.5–5.0)
Alkaline Phosphatase: 89 U/L (ref 38–126)
Anion gap: 12 (ref 5–15)
BUN: 19 mg/dL (ref 8–23)
CO2: 28 mmol/L (ref 22–32)
Calcium: 9.2 mg/dL (ref 8.9–10.3)
Chloride: 103 mmol/L (ref 98–111)
Creatinine, Ser: 1.29 mg/dL — ABNORMAL HIGH (ref 0.61–1.24)
GFR, Estimated: 55 mL/min — ABNORMAL LOW (ref >60)
Glucose, Bld: 104 mg/dL — ABNORMAL HIGH (ref 70–99)
Potassium: 3.5 mmol/L (ref 3.5–5.1)
Sodium: 143 mmol/L (ref 135–145)
Total Bilirubin: 0.8 mg/dL (ref 0.0–1.2)
Total Protein: 7.1 g/dL (ref 6.5–8.1)

## 2024-01-12 LAB — LACTIC ACID, PLASMA: Lactic Acid, Venous: 1.1 mmol/L (ref 0.5–1.9)

## 2024-01-12 MED ORDER — CEPHALEXIN 500 MG PO CAPS
500.0000 mg | ORAL_CAPSULE | Freq: Three times a day (TID) | ORAL | 0 refills | Status: DC
Start: 2024-01-12 — End: 2024-01-12

## 2024-01-12 MED ORDER — CEPHALEXIN 500 MG PO CAPS: 500.0000 mg | ORAL_CAPSULE | Freq: Three times a day (TID) | ORAL | 0 refills | Status: AC

## 2024-01-12 NOTE — ED Provider Notes (Signed)
 [place] EMERGENCY DEPARTMENT AT [facility] [place] Provider Note   CSN: [id number] Arrival date & time: 01/12/24  1221     Patient presents with: Foot Swelling and Wound Check   [name] [name] is a 83 y.o. male.   HPI      83 year old male with a history of coronary artery disease, dementia, hyperlipidemia, atrial fibrillation on Xarelto , who presents with concern for foot wound.    Has had lymphedema that has been followed by team of specialists for some time.Home health coming for over 2 years Lives in Assisted Living [name] [name] is private nurse who comes to see him, says more red and swollen over the last few days.   September 7th noticed maggots They came to ED but the wait was long and they left Today the home health nurse [name] [name] again saw possible maggots and wanted him evaluated in ED Daughter reports his legs have been waxing and waning over years They are using bactroban, triamcinolone .  No fever, no nausea or vomiting.. no chest pain. Family notes he was under quarantine when he had shingles and might have not been receiving as much care/hygiene. Previously he did have sepsis   Past Medical History:  Diagnosis Date   CAD (coronary artery disease)    LHC 11/06: pLAD 90, pD1 30, oD2 25 with inf branch 60; septal perf ostial 95; prox MOM 25, RCA 25, oPL2 95, EF 65 >> PCI:  BMS to proximal LAD    Dementia (HCC)    FH: CAD (coronary artery disease)    History of echocardiogram    Echo 3/17:  Mod LVH, EF 55-60, no RWMA, mild AI, mild to mod RAE   History of tobacco abuse    Hyperlipidemia    Persistent atrial fibrillation (HCC)    Xarelto  for a/c    Prior to Admission medications   Medication Sig Start Date End Date Taking? Authorizing Provider  acetaminophen  (TYLENOL ) 500 MG tablet Take 1,000 mg by mouth daily.    [provider]  apixaban  (ELIQUIS ) 5 MG TABS tablet Take 1 tablet (5 mg total) by mouth 2 (two) times daily. 08/18/21   Drusilla Sabas RAMAN, MD   atenolol  (TENORMIN ) 25 MG tablet Take 25 mg by mouth daily as needed (if HR > 99 bpm afib).    [provider]  atorvastatin  (LIPITOR) 10 MG tablet Take 10 mg by mouth daily. 09/02/21   [provider]  cephALEXin  (KEFLEX ) 500 MG capsule Take 1 capsule (500 mg total) by mouth 3 (three) times daily for 7 days. 01/12/24 01/19/24  Dreama Longs, MD  cetirizine (ZYRTEC) 10 MG chewable tablet Chew 10 mg by mouth daily.    [provider]  miconazole (MICOTIN) 2 % powder Apply topically as needed for itching.    [provider]  mupirocin ointment (BACTROBAN) 2 % 1 Application 2 (two) times daily.    [provider]  spironolactone (ALDACTONE) 50 MG tablet Take 50 mg by mouth daily. 10/09/22   [provider]  torsemide (DEMADEX) 20 MG tablet Take 20 mg by mouth daily.    [provider]  triamcinolone  (KENALOG ) 0.025 % ointment Apply topically as directed. 10/19/21   [provider]  vitamin C (ASCORBIC ACID ) 500 MG tablet Take 500 mg by mouth daily.    [provider]  Vitamin D, Ergocalciferol, (DRISDOL) 1.25 MG (50000 UNIT) CAPS capsule Take 50,000 Units by mouth once a week. 10/17/21   [provider]  Allergies: Patient has no known allergies.    Review of Systems  Updated Vital Signs BP (!) 152/116   Pulse 86   Temp 98.1 F (36.7 C) (Oral)   Resp 17   Ht 5' 11 (1.803 m)   Wt 83 kg   SpO2 98%   BMI 25.52 kg/m   Physical Exam Vitals and nursing note reviewed.  Constitutional:      General: He is not in acute distress.    Appearance: Normal appearance. He is not ill-appearing, toxic-appearing or diaphoretic.  HENT:     Head: Normocephalic.  Eyes:     Conjunctiva/sclera: Conjunctivae normal.  Cardiovascular:     Rate and Rhythm: Normal rate and regular rhythm.     Pulses: Normal pulses.  Pulmonary:     Effort: Pulmonary effort is normal. No respiratory distress.  Musculoskeletal:         General: No deformity or signs of injury.     Cervical back: No rigidity.  Skin:    General: Skin is warm and dry.     Coloration: Skin is not jaundiced or pale.     Comments: See photos. Bilateral LE with changes from venous stasis, areas of erythema, skin thieckennig. 1.5cm ulcer in crease of left great toe   Neurological:     General: No focal deficit present.     Mental Status: He is alert and oriented to person, place, and time.     (all labs ordered are listed, but only abnormal results are displayed) Labs Reviewed  CBC WITH DIFFERENTIAL/PLATELET - Abnormal; Notable for the following components:      Result Value   RDW 16.2 (*)    All other components within normal limits  COMPREHENSIVE METABOLIC PANEL WITH GFR - Abnormal; Notable for the following components:   Glucose, Bld 104 (*)    Creatinine, Ser 1.29 (*)    GFR, Estimated 55 (*)    All other components within normal limits  CULTURE, BLOOD (ROUTINE X 2)  CULTURE, BLOOD (ROUTINE X 2)  LACTIC ACID, PLASMA    EKG: None  Radiology: No results found.   Procedures   Medications Ordered in the ED - No data to display                                          83 year old male with a history of coronary artery disease, dementia, hyperlipidemia, atrial fibrillation on Xarelto , who presents with concern for foot wound.    Has chronic bilateral changes from lymphedema and venous stasis. Normal pulses. Doubt dvt given he is on anticoagulation.  Left foot has a little more erythema, will treat with keflex  for possible cellulitis. Labs completed and evaluated by me show no leukocytosis, no anemia, normal lactic acid, no electrolyte abnormalities.  Cr mildly elevated from prior-recommend follow up.    At this time, do not see maggots in the wound. Wound is small, no clear signs of infection, no sign of compartment syndrome or necrotizing fasciitis.  Given the foot is more swollen and red, will treat with keflex  for  possible developing cellulitis.  Do not see indication at this time for admission to the hospital or debridement of wounds in the OR.  Recommend continued elevation, compression, empiric abx prescribed and follow up MOnday with wound doctor. Patient discharged in stable condition with understanding of reasons to return.  Final diagnoses:  Visit for wound check  Cellulitis of left lower extremity  Skin ulcer of left great toe, limited to breakdown of skin [facility])  Lymphedema    ED Discharge Orders          Ordered    cephALEXin  (KEFLEX ) 500 MG capsule  3 times daily,   Status:  Discontinued        01/12/24 1436    cephALEXin  (KEFLEX ) 500 MG capsule  3 times daily        01/12/24 1437               Dreama Longs, MD 01/12/24 2337

## 2024-01-12 NOTE — ED Triage Notes (Addendum)
 Patient arrives with daughter (POA) from [facility] living facility. Facility reports patient has maggots in both foot. Chronic wounds on both feet and sees wound care. Reports staff has been cleaning wounds but may have not been as consistent due to recent quarantine for shingles.

## 2024-01-12 NOTE — Discharge Instructions (Addendum)
 Take the antibiotics as prescribed. You do not have signs of sepsis today.  I do not see signs of maggots on your current exam.  Please follow up closely as scheduled with your wound care provider.

## 2024-01-12 NOTE — ED Notes (Signed)
 Legs re-wrapped with cast padding and coband. Dtr states wound care will see pt tomorrow morning. Dispo home.

## 2024-01-14 ENCOUNTER — Encounter (HOSPITAL_BASED_OUTPATIENT_CLINIC_OR_DEPARTMENT_OTHER): Attending: Internal Medicine | Admitting: Internal Medicine

## 2024-01-14 DIAGNOSIS — L97822 Non-pressure chronic ulcer of other part of left lower leg with fat layer exposed: Secondary | ICD-10-CM | POA: Diagnosis not present

## 2024-01-14 DIAGNOSIS — F0390 Unspecified dementia without behavioral disturbance: Secondary | ICD-10-CM | POA: Diagnosis not present

## 2024-01-14 DIAGNOSIS — I87313 Chronic venous hypertension (idiopathic) with ulcer of bilateral lower extremity: Secondary | ICD-10-CM | POA: Insufficient documentation

## 2024-01-14 DIAGNOSIS — I87312 Chronic venous hypertension (idiopathic) with ulcer of left lower extremity: Secondary | ICD-10-CM | POA: Diagnosis not present

## 2024-01-14 DIAGNOSIS — L97812 Non-pressure chronic ulcer of other part of right lower leg with fat layer exposed: Secondary | ICD-10-CM | POA: Diagnosis not present

## 2024-01-14 DIAGNOSIS — I890 Lymphedema, not elsewhere classified: Secondary | ICD-10-CM | POA: Diagnosis not present

## 2024-01-14 DIAGNOSIS — I87311 Chronic venous hypertension (idiopathic) with ulcer of right lower extremity: Secondary | ICD-10-CM

## 2024-01-16 DIAGNOSIS — F0394 Unspecified dementia, unspecified severity, with anxiety: Secondary | ICD-10-CM | POA: Diagnosis not present

## 2024-01-16 DIAGNOSIS — F32A Depression, unspecified: Secondary | ICD-10-CM | POA: Diagnosis not present

## 2024-01-16 DIAGNOSIS — I87321 Chronic venous hypertension (idiopathic) with inflammation of right lower extremity: Secondary | ICD-10-CM | POA: Diagnosis not present

## 2024-01-16 DIAGNOSIS — E782 Mixed hyperlipidemia: Secondary | ICD-10-CM | POA: Diagnosis not present

## 2024-01-16 DIAGNOSIS — R600 Localized edema: Secondary | ICD-10-CM | POA: Diagnosis not present

## 2024-01-16 DIAGNOSIS — E559 Vitamin D deficiency, unspecified: Secondary | ICD-10-CM | POA: Diagnosis not present

## 2024-01-16 DIAGNOSIS — F0393 Unspecified dementia, unspecified severity, with mood disturbance: Secondary | ICD-10-CM | POA: Diagnosis not present

## 2024-01-16 DIAGNOSIS — I482 Chronic atrial fibrillation, unspecified: Secondary | ICD-10-CM | POA: Diagnosis not present

## 2024-01-16 DIAGNOSIS — I890 Lymphedema, not elsewhere classified: Secondary | ICD-10-CM | POA: Diagnosis not present

## 2024-01-16 DIAGNOSIS — F4321 Adjustment disorder with depressed mood: Secondary | ICD-10-CM | POA: Diagnosis not present

## 2024-01-16 DIAGNOSIS — Z7901 Long term (current) use of anticoagulants: Secondary | ICD-10-CM | POA: Diagnosis not present

## 2024-01-16 DIAGNOSIS — L97811 Non-pressure chronic ulcer of other part of right lower leg limited to breakdown of skin: Secondary | ICD-10-CM | POA: Diagnosis not present

## 2024-01-16 DIAGNOSIS — G8929 Other chronic pain: Secondary | ICD-10-CM | POA: Diagnosis not present

## 2024-01-16 DIAGNOSIS — I739 Peripheral vascular disease, unspecified: Secondary | ICD-10-CM | POA: Diagnosis not present

## 2024-01-17 ENCOUNTER — Encounter (HOSPITAL_BASED_OUTPATIENT_CLINIC_OR_DEPARTMENT_OTHER): Admitting: Internal Medicine

## 2024-01-17 LAB — CULTURE, BLOOD (ROUTINE X 2): Culture: NO GROWTH

## 2024-01-18 DIAGNOSIS — F32A Depression, unspecified: Secondary | ICD-10-CM | POA: Diagnosis not present

## 2024-01-18 DIAGNOSIS — E782 Mixed hyperlipidemia: Secondary | ICD-10-CM | POA: Diagnosis not present

## 2024-01-18 DIAGNOSIS — I482 Chronic atrial fibrillation, unspecified: Secondary | ICD-10-CM | POA: Diagnosis not present

## 2024-01-18 DIAGNOSIS — I87321 Chronic venous hypertension (idiopathic) with inflammation of right lower extremity: Secondary | ICD-10-CM | POA: Diagnosis not present

## 2024-01-18 DIAGNOSIS — F0393 Unspecified dementia, unspecified severity, with mood disturbance: Secondary | ICD-10-CM | POA: Diagnosis not present

## 2024-01-18 DIAGNOSIS — F4321 Adjustment disorder with depressed mood: Secondary | ICD-10-CM | POA: Diagnosis not present

## 2024-01-18 DIAGNOSIS — L97811 Non-pressure chronic ulcer of other part of right lower leg limited to breakdown of skin: Secondary | ICD-10-CM | POA: Diagnosis not present

## 2024-01-18 DIAGNOSIS — F0394 Unspecified dementia, unspecified severity, with anxiety: Secondary | ICD-10-CM | POA: Diagnosis not present

## 2024-01-18 DIAGNOSIS — R600 Localized edema: Secondary | ICD-10-CM | POA: Diagnosis not present

## 2024-01-18 DIAGNOSIS — I739 Peripheral vascular disease, unspecified: Secondary | ICD-10-CM | POA: Diagnosis not present

## 2024-01-18 DIAGNOSIS — I890 Lymphedema, not elsewhere classified: Secondary | ICD-10-CM | POA: Diagnosis not present

## 2024-01-18 DIAGNOSIS — Z7901 Long term (current) use of anticoagulants: Secondary | ICD-10-CM | POA: Diagnosis not present

## 2024-01-18 DIAGNOSIS — G8929 Other chronic pain: Secondary | ICD-10-CM | POA: Diagnosis not present

## 2024-01-18 DIAGNOSIS — E559 Vitamin D deficiency, unspecified: Secondary | ICD-10-CM | POA: Diagnosis not present

## 2024-01-21 DIAGNOSIS — R600 Localized edema: Secondary | ICD-10-CM | POA: Diagnosis not present

## 2024-01-21 DIAGNOSIS — E782 Mixed hyperlipidemia: Secondary | ICD-10-CM | POA: Diagnosis not present

## 2024-01-21 DIAGNOSIS — I87321 Chronic venous hypertension (idiopathic) with inflammation of right lower extremity: Secondary | ICD-10-CM | POA: Diagnosis not present

## 2024-01-21 DIAGNOSIS — I890 Lymphedema, not elsewhere classified: Secondary | ICD-10-CM | POA: Diagnosis not present

## 2024-01-21 DIAGNOSIS — Z7901 Long term (current) use of anticoagulants: Secondary | ICD-10-CM | POA: Diagnosis not present

## 2024-01-21 DIAGNOSIS — L97811 Non-pressure chronic ulcer of other part of right lower leg limited to breakdown of skin: Secondary | ICD-10-CM | POA: Diagnosis not present

## 2024-01-21 DIAGNOSIS — F0393 Unspecified dementia, unspecified severity, with mood disturbance: Secondary | ICD-10-CM | POA: Diagnosis not present

## 2024-01-21 DIAGNOSIS — F32A Depression, unspecified: Secondary | ICD-10-CM | POA: Diagnosis not present

## 2024-01-21 DIAGNOSIS — I739 Peripheral vascular disease, unspecified: Secondary | ICD-10-CM | POA: Diagnosis not present

## 2024-01-21 DIAGNOSIS — I482 Chronic atrial fibrillation, unspecified: Secondary | ICD-10-CM | POA: Diagnosis not present

## 2024-01-21 DIAGNOSIS — F4321 Adjustment disorder with depressed mood: Secondary | ICD-10-CM | POA: Diagnosis not present

## 2024-01-21 DIAGNOSIS — E559 Vitamin D deficiency, unspecified: Secondary | ICD-10-CM | POA: Diagnosis not present

## 2024-01-21 DIAGNOSIS — F0394 Unspecified dementia, unspecified severity, with anxiety: Secondary | ICD-10-CM | POA: Diagnosis not present

## 2024-01-21 DIAGNOSIS — G8929 Other chronic pain: Secondary | ICD-10-CM | POA: Diagnosis not present

## 2024-01-23 DIAGNOSIS — R600 Localized edema: Secondary | ICD-10-CM | POA: Diagnosis not present

## 2024-01-23 DIAGNOSIS — I87321 Chronic venous hypertension (idiopathic) with inflammation of right lower extremity: Secondary | ICD-10-CM | POA: Diagnosis not present

## 2024-01-23 DIAGNOSIS — L97811 Non-pressure chronic ulcer of other part of right lower leg limited to breakdown of skin: Secondary | ICD-10-CM | POA: Diagnosis not present

## 2024-01-23 DIAGNOSIS — I739 Peripheral vascular disease, unspecified: Secondary | ICD-10-CM | POA: Diagnosis not present

## 2024-01-23 DIAGNOSIS — F0394 Unspecified dementia, unspecified severity, with anxiety: Secondary | ICD-10-CM | POA: Diagnosis not present

## 2024-01-23 DIAGNOSIS — F32A Depression, unspecified: Secondary | ICD-10-CM | POA: Diagnosis not present

## 2024-01-23 DIAGNOSIS — I890 Lymphedema, not elsewhere classified: Secondary | ICD-10-CM | POA: Diagnosis not present

## 2024-01-23 DIAGNOSIS — I482 Chronic atrial fibrillation, unspecified: Secondary | ICD-10-CM | POA: Diagnosis not present

## 2024-01-23 DIAGNOSIS — E782 Mixed hyperlipidemia: Secondary | ICD-10-CM | POA: Diagnosis not present

## 2024-01-23 DIAGNOSIS — G8929 Other chronic pain: Secondary | ICD-10-CM | POA: Diagnosis not present

## 2024-01-23 DIAGNOSIS — Z7901 Long term (current) use of anticoagulants: Secondary | ICD-10-CM | POA: Diagnosis not present

## 2024-01-23 DIAGNOSIS — F4321 Adjustment disorder with depressed mood: Secondary | ICD-10-CM | POA: Diagnosis not present

## 2024-01-23 DIAGNOSIS — F0393 Unspecified dementia, unspecified severity, with mood disturbance: Secondary | ICD-10-CM | POA: Diagnosis not present

## 2024-01-23 DIAGNOSIS — E559 Vitamin D deficiency, unspecified: Secondary | ICD-10-CM | POA: Diagnosis not present

## 2024-01-24 ENCOUNTER — Encounter: Payer: Self-pay | Admitting: Podiatry

## 2024-01-24 ENCOUNTER — Encounter: Payer: Self-pay | Admitting: Cardiovascular Disease

## 2024-01-25 DIAGNOSIS — F4321 Adjustment disorder with depressed mood: Secondary | ICD-10-CM | POA: Diagnosis not present

## 2024-01-25 DIAGNOSIS — F0393 Unspecified dementia, unspecified severity, with mood disturbance: Secondary | ICD-10-CM | POA: Diagnosis not present

## 2024-01-25 DIAGNOSIS — I890 Lymphedema, not elsewhere classified: Secondary | ICD-10-CM | POA: Diagnosis not present

## 2024-01-25 DIAGNOSIS — I482 Chronic atrial fibrillation, unspecified: Secondary | ICD-10-CM | POA: Diagnosis not present

## 2024-01-25 DIAGNOSIS — E559 Vitamin D deficiency, unspecified: Secondary | ICD-10-CM | POA: Diagnosis not present

## 2024-01-25 DIAGNOSIS — G8929 Other chronic pain: Secondary | ICD-10-CM | POA: Diagnosis not present

## 2024-01-25 DIAGNOSIS — E782 Mixed hyperlipidemia: Secondary | ICD-10-CM | POA: Diagnosis not present

## 2024-01-25 DIAGNOSIS — F32A Depression, unspecified: Secondary | ICD-10-CM | POA: Diagnosis not present

## 2024-01-25 DIAGNOSIS — F0394 Unspecified dementia, unspecified severity, with anxiety: Secondary | ICD-10-CM | POA: Diagnosis not present

## 2024-01-25 DIAGNOSIS — I739 Peripheral vascular disease, unspecified: Secondary | ICD-10-CM | POA: Diagnosis not present

## 2024-01-25 DIAGNOSIS — L97811 Non-pressure chronic ulcer of other part of right lower leg limited to breakdown of skin: Secondary | ICD-10-CM | POA: Diagnosis not present

## 2024-01-25 DIAGNOSIS — R600 Localized edema: Secondary | ICD-10-CM | POA: Diagnosis not present

## 2024-01-25 DIAGNOSIS — Z7901 Long term (current) use of anticoagulants: Secondary | ICD-10-CM | POA: Diagnosis not present

## 2024-01-25 DIAGNOSIS — I87321 Chronic venous hypertension (idiopathic) with inflammation of right lower extremity: Secondary | ICD-10-CM | POA: Diagnosis not present

## 2024-01-28 ENCOUNTER — Encounter (HOSPITAL_BASED_OUTPATIENT_CLINIC_OR_DEPARTMENT_OTHER): Admitting: Internal Medicine

## 2024-01-28 DIAGNOSIS — I87312 Chronic venous hypertension (idiopathic) with ulcer of left lower extremity: Secondary | ICD-10-CM | POA: Diagnosis not present

## 2024-01-28 DIAGNOSIS — I87311 Chronic venous hypertension (idiopathic) with ulcer of right lower extremity: Secondary | ICD-10-CM

## 2024-01-28 DIAGNOSIS — L97812 Non-pressure chronic ulcer of other part of right lower leg with fat layer exposed: Secondary | ICD-10-CM

## 2024-01-28 DIAGNOSIS — L97822 Non-pressure chronic ulcer of other part of left lower leg with fat layer exposed: Secondary | ICD-10-CM | POA: Diagnosis not present

## 2024-01-28 DIAGNOSIS — I87313 Chronic venous hypertension (idiopathic) with ulcer of bilateral lower extremity: Secondary | ICD-10-CM | POA: Diagnosis not present

## 2024-01-28 DIAGNOSIS — F0390 Unspecified dementia without behavioral disturbance: Secondary | ICD-10-CM | POA: Diagnosis not present

## 2024-01-28 DIAGNOSIS — I890 Lymphedema, not elsewhere classified: Secondary | ICD-10-CM | POA: Diagnosis not present

## 2024-01-29 DIAGNOSIS — F4321 Adjustment disorder with depressed mood: Secondary | ICD-10-CM | POA: Diagnosis not present

## 2024-01-29 DIAGNOSIS — I890 Lymphedema, not elsewhere classified: Secondary | ICD-10-CM | POA: Diagnosis not present

## 2024-01-29 DIAGNOSIS — I87321 Chronic venous hypertension (idiopathic) with inflammation of right lower extremity: Secondary | ICD-10-CM | POA: Diagnosis not present

## 2024-01-29 DIAGNOSIS — F32A Depression, unspecified: Secondary | ICD-10-CM | POA: Diagnosis not present

## 2024-01-29 DIAGNOSIS — E782 Mixed hyperlipidemia: Secondary | ICD-10-CM | POA: Diagnosis not present

## 2024-01-29 DIAGNOSIS — I739 Peripheral vascular disease, unspecified: Secondary | ICD-10-CM | POA: Diagnosis not present

## 2024-01-29 DIAGNOSIS — Z7901 Long term (current) use of anticoagulants: Secondary | ICD-10-CM | POA: Diagnosis not present

## 2024-01-29 DIAGNOSIS — E559 Vitamin D deficiency, unspecified: Secondary | ICD-10-CM | POA: Diagnosis not present

## 2024-01-29 DIAGNOSIS — F0394 Unspecified dementia, unspecified severity, with anxiety: Secondary | ICD-10-CM | POA: Diagnosis not present

## 2024-01-29 DIAGNOSIS — I482 Chronic atrial fibrillation, unspecified: Secondary | ICD-10-CM | POA: Diagnosis not present

## 2024-01-29 DIAGNOSIS — R600 Localized edema: Secondary | ICD-10-CM | POA: Diagnosis not present

## 2024-01-29 DIAGNOSIS — F0390 Unspecified dementia without behavioral disturbance: Secondary | ICD-10-CM | POA: Diagnosis not present

## 2024-01-29 DIAGNOSIS — F0393 Unspecified dementia, unspecified severity, with mood disturbance: Secondary | ICD-10-CM | POA: Diagnosis not present

## 2024-01-29 DIAGNOSIS — L97811 Non-pressure chronic ulcer of other part of right lower leg limited to breakdown of skin: Secondary | ICD-10-CM | POA: Diagnosis not present

## 2024-01-29 DIAGNOSIS — G8929 Other chronic pain: Secondary | ICD-10-CM | POA: Diagnosis not present

## 2024-01-30 DIAGNOSIS — E559 Vitamin D deficiency, unspecified: Secondary | ICD-10-CM | POA: Diagnosis not present

## 2024-01-30 DIAGNOSIS — Z7901 Long term (current) use of anticoagulants: Secondary | ICD-10-CM | POA: Diagnosis not present

## 2024-01-30 DIAGNOSIS — R600 Localized edema: Secondary | ICD-10-CM | POA: Diagnosis not present

## 2024-01-30 DIAGNOSIS — I739 Peripheral vascular disease, unspecified: Secondary | ICD-10-CM | POA: Diagnosis not present

## 2024-01-30 DIAGNOSIS — L97811 Non-pressure chronic ulcer of other part of right lower leg limited to breakdown of skin: Secondary | ICD-10-CM | POA: Diagnosis not present

## 2024-01-30 DIAGNOSIS — F32A Depression, unspecified: Secondary | ICD-10-CM | POA: Diagnosis not present

## 2024-01-30 DIAGNOSIS — F0394 Unspecified dementia, unspecified severity, with anxiety: Secondary | ICD-10-CM | POA: Diagnosis not present

## 2024-01-30 DIAGNOSIS — G8929 Other chronic pain: Secondary | ICD-10-CM | POA: Diagnosis not present

## 2024-01-30 DIAGNOSIS — I87321 Chronic venous hypertension (idiopathic) with inflammation of right lower extremity: Secondary | ICD-10-CM | POA: Diagnosis not present

## 2024-01-30 DIAGNOSIS — F0393 Unspecified dementia, unspecified severity, with mood disturbance: Secondary | ICD-10-CM | POA: Diagnosis not present

## 2024-01-30 DIAGNOSIS — I890 Lymphedema, not elsewhere classified: Secondary | ICD-10-CM | POA: Diagnosis not present

## 2024-01-30 DIAGNOSIS — E782 Mixed hyperlipidemia: Secondary | ICD-10-CM | POA: Diagnosis not present

## 2024-01-30 DIAGNOSIS — F4321 Adjustment disorder with depressed mood: Secondary | ICD-10-CM | POA: Diagnosis not present

## 2024-01-30 DIAGNOSIS — I482 Chronic atrial fibrillation, unspecified: Secondary | ICD-10-CM | POA: Diagnosis not present

## 2024-01-31 DIAGNOSIS — E782 Mixed hyperlipidemia: Secondary | ICD-10-CM | POA: Diagnosis not present

## 2024-01-31 DIAGNOSIS — I87321 Chronic venous hypertension (idiopathic) with inflammation of right lower extremity: Secondary | ICD-10-CM | POA: Diagnosis not present

## 2024-01-31 DIAGNOSIS — I739 Peripheral vascular disease, unspecified: Secondary | ICD-10-CM | POA: Diagnosis not present

## 2024-01-31 DIAGNOSIS — I480 Paroxysmal atrial fibrillation: Secondary | ICD-10-CM | POA: Diagnosis not present

## 2024-02-01 DIAGNOSIS — I890 Lymphedema, not elsewhere classified: Secondary | ICD-10-CM | POA: Diagnosis not present

## 2024-02-01 DIAGNOSIS — I482 Chronic atrial fibrillation, unspecified: Secondary | ICD-10-CM | POA: Diagnosis not present

## 2024-02-01 DIAGNOSIS — F0394 Unspecified dementia, unspecified severity, with anxiety: Secondary | ICD-10-CM | POA: Diagnosis not present

## 2024-02-01 DIAGNOSIS — E559 Vitamin D deficiency, unspecified: Secondary | ICD-10-CM | POA: Diagnosis not present

## 2024-02-01 DIAGNOSIS — F0393 Unspecified dementia, unspecified severity, with mood disturbance: Secondary | ICD-10-CM | POA: Diagnosis not present

## 2024-02-01 DIAGNOSIS — R600 Localized edema: Secondary | ICD-10-CM | POA: Diagnosis not present

## 2024-02-01 DIAGNOSIS — G8929 Other chronic pain: Secondary | ICD-10-CM | POA: Diagnosis not present

## 2024-02-01 DIAGNOSIS — F32A Depression, unspecified: Secondary | ICD-10-CM | POA: Diagnosis not present

## 2024-02-01 DIAGNOSIS — L97811 Non-pressure chronic ulcer of other part of right lower leg limited to breakdown of skin: Secondary | ICD-10-CM | POA: Diagnosis not present

## 2024-02-01 DIAGNOSIS — E782 Mixed hyperlipidemia: Secondary | ICD-10-CM | POA: Diagnosis not present

## 2024-02-01 DIAGNOSIS — I87321 Chronic venous hypertension (idiopathic) with inflammation of right lower extremity: Secondary | ICD-10-CM | POA: Diagnosis not present

## 2024-02-01 DIAGNOSIS — F4321 Adjustment disorder with depressed mood: Secondary | ICD-10-CM | POA: Diagnosis not present

## 2024-02-01 DIAGNOSIS — I739 Peripheral vascular disease, unspecified: Secondary | ICD-10-CM | POA: Diagnosis not present

## 2024-02-01 DIAGNOSIS — Z7901 Long term (current) use of anticoagulants: Secondary | ICD-10-CM | POA: Diagnosis not present

## 2024-02-04 DIAGNOSIS — G8929 Other chronic pain: Secondary | ICD-10-CM | POA: Diagnosis not present

## 2024-02-04 DIAGNOSIS — R600 Localized edema: Secondary | ICD-10-CM | POA: Diagnosis not present

## 2024-02-04 DIAGNOSIS — L97811 Non-pressure chronic ulcer of other part of right lower leg limited to breakdown of skin: Secondary | ICD-10-CM | POA: Diagnosis not present

## 2024-02-04 DIAGNOSIS — Z7901 Long term (current) use of anticoagulants: Secondary | ICD-10-CM | POA: Diagnosis not present

## 2024-02-04 DIAGNOSIS — F0393 Unspecified dementia, unspecified severity, with mood disturbance: Secondary | ICD-10-CM | POA: Diagnosis not present

## 2024-02-04 DIAGNOSIS — I739 Peripheral vascular disease, unspecified: Secondary | ICD-10-CM | POA: Diagnosis not present

## 2024-02-04 DIAGNOSIS — E559 Vitamin D deficiency, unspecified: Secondary | ICD-10-CM | POA: Diagnosis not present

## 2024-02-04 DIAGNOSIS — F0394 Unspecified dementia, unspecified severity, with anxiety: Secondary | ICD-10-CM | POA: Diagnosis not present

## 2024-02-04 DIAGNOSIS — F4321 Adjustment disorder with depressed mood: Secondary | ICD-10-CM | POA: Diagnosis not present

## 2024-02-04 DIAGNOSIS — I890 Lymphedema, not elsewhere classified: Secondary | ICD-10-CM | POA: Diagnosis not present

## 2024-02-04 DIAGNOSIS — F32A Depression, unspecified: Secondary | ICD-10-CM | POA: Diagnosis not present

## 2024-02-04 DIAGNOSIS — E782 Mixed hyperlipidemia: Secondary | ICD-10-CM | POA: Diagnosis not present

## 2024-02-04 DIAGNOSIS — I482 Chronic atrial fibrillation, unspecified: Secondary | ICD-10-CM | POA: Diagnosis not present

## 2024-02-04 DIAGNOSIS — I87321 Chronic venous hypertension (idiopathic) with inflammation of right lower extremity: Secondary | ICD-10-CM | POA: Diagnosis not present

## 2024-02-05 DIAGNOSIS — I890 Lymphedema, not elsewhere classified: Secondary | ICD-10-CM | POA: Diagnosis not present

## 2024-02-05 DIAGNOSIS — L97811 Non-pressure chronic ulcer of other part of right lower leg limited to breakdown of skin: Secondary | ICD-10-CM | POA: Diagnosis not present

## 2024-02-05 DIAGNOSIS — F0394 Unspecified dementia, unspecified severity, with anxiety: Secondary | ICD-10-CM | POA: Diagnosis not present

## 2024-02-05 DIAGNOSIS — I87321 Chronic venous hypertension (idiopathic) with inflammation of right lower extremity: Secondary | ICD-10-CM | POA: Diagnosis not present

## 2024-02-05 DIAGNOSIS — E559 Vitamin D deficiency, unspecified: Secondary | ICD-10-CM | POA: Diagnosis not present

## 2024-02-05 DIAGNOSIS — I482 Chronic atrial fibrillation, unspecified: Secondary | ICD-10-CM | POA: Diagnosis not present

## 2024-02-05 DIAGNOSIS — F4321 Adjustment disorder with depressed mood: Secondary | ICD-10-CM | POA: Diagnosis not present

## 2024-02-05 DIAGNOSIS — Z7901 Long term (current) use of anticoagulants: Secondary | ICD-10-CM | POA: Diagnosis not present

## 2024-02-05 DIAGNOSIS — G8929 Other chronic pain: Secondary | ICD-10-CM | POA: Diagnosis not present

## 2024-02-05 DIAGNOSIS — F32A Depression, unspecified: Secondary | ICD-10-CM | POA: Diagnosis not present

## 2024-02-05 DIAGNOSIS — R600 Localized edema: Secondary | ICD-10-CM | POA: Diagnosis not present

## 2024-02-05 DIAGNOSIS — I739 Peripheral vascular disease, unspecified: Secondary | ICD-10-CM | POA: Diagnosis not present

## 2024-02-05 DIAGNOSIS — E782 Mixed hyperlipidemia: Secondary | ICD-10-CM | POA: Diagnosis not present

## 2024-02-05 DIAGNOSIS — F0393 Unspecified dementia, unspecified severity, with mood disturbance: Secondary | ICD-10-CM | POA: Diagnosis not present

## 2024-02-11 ENCOUNTER — Encounter (HOSPITAL_BASED_OUTPATIENT_CLINIC_OR_DEPARTMENT_OTHER): Admitting: Internal Medicine

## 2024-02-11 DIAGNOSIS — F32A Depression, unspecified: Secondary | ICD-10-CM | POA: Diagnosis not present

## 2024-02-11 DIAGNOSIS — G301 Alzheimer's disease with late onset: Secondary | ICD-10-CM | POA: Diagnosis not present

## 2024-02-11 DIAGNOSIS — E782 Mixed hyperlipidemia: Secondary | ICD-10-CM | POA: Diagnosis not present

## 2024-02-11 DIAGNOSIS — L97811 Non-pressure chronic ulcer of other part of right lower leg limited to breakdown of skin: Secondary | ICD-10-CM | POA: Diagnosis not present

## 2024-02-11 DIAGNOSIS — I87321 Chronic venous hypertension (idiopathic) with inflammation of right lower extremity: Secondary | ICD-10-CM | POA: Diagnosis not present

## 2024-02-11 DIAGNOSIS — I739 Peripheral vascular disease, unspecified: Secondary | ICD-10-CM | POA: Diagnosis not present

## 2024-02-11 DIAGNOSIS — F4321 Adjustment disorder with depressed mood: Secondary | ICD-10-CM | POA: Diagnosis not present

## 2024-02-11 DIAGNOSIS — F0394 Unspecified dementia, unspecified severity, with anxiety: Secondary | ICD-10-CM | POA: Diagnosis not present

## 2024-02-11 DIAGNOSIS — F331 Major depressive disorder, recurrent, moderate: Secondary | ICD-10-CM | POA: Diagnosis not present

## 2024-02-11 DIAGNOSIS — I482 Chronic atrial fibrillation, unspecified: Secondary | ICD-10-CM | POA: Diagnosis not present

## 2024-02-11 DIAGNOSIS — Z7901 Long term (current) use of anticoagulants: Secondary | ICD-10-CM | POA: Diagnosis not present

## 2024-02-11 DIAGNOSIS — G8929 Other chronic pain: Secondary | ICD-10-CM | POA: Diagnosis not present

## 2024-02-11 DIAGNOSIS — R600 Localized edema: Secondary | ICD-10-CM | POA: Diagnosis not present

## 2024-02-11 DIAGNOSIS — E559 Vitamin D deficiency, unspecified: Secondary | ICD-10-CM | POA: Diagnosis not present

## 2024-02-11 DIAGNOSIS — I890 Lymphedema, not elsewhere classified: Secondary | ICD-10-CM | POA: Diagnosis not present

## 2024-02-11 DIAGNOSIS — F02B0 Dementia in other diseases classified elsewhere, moderate, without behavioral disturbance, psychotic disturbance, mood disturbance, and anxiety: Secondary | ICD-10-CM | POA: Diagnosis not present

## 2024-02-11 DIAGNOSIS — F0393 Unspecified dementia, unspecified severity, with mood disturbance: Secondary | ICD-10-CM | POA: Diagnosis not present

## 2024-02-13 ENCOUNTER — Encounter (HOSPITAL_BASED_OUTPATIENT_CLINIC_OR_DEPARTMENT_OTHER): Attending: Internal Medicine | Admitting: Internal Medicine

## 2024-02-13 DIAGNOSIS — Z87891 Personal history of nicotine dependence: Secondary | ICD-10-CM | POA: Diagnosis not present

## 2024-02-13 DIAGNOSIS — L97822 Non-pressure chronic ulcer of other part of left lower leg with fat layer exposed: Secondary | ICD-10-CM | POA: Insufficient documentation

## 2024-02-13 DIAGNOSIS — I87312 Chronic venous hypertension (idiopathic) with ulcer of left lower extremity: Secondary | ICD-10-CM

## 2024-02-13 DIAGNOSIS — F0390 Unspecified dementia without behavioral disturbance: Secondary | ICD-10-CM | POA: Insufficient documentation

## 2024-02-13 DIAGNOSIS — I87313 Chronic venous hypertension (idiopathic) with ulcer of bilateral lower extremity: Secondary | ICD-10-CM | POA: Diagnosis not present

## 2024-02-13 DIAGNOSIS — I87311 Chronic venous hypertension (idiopathic) with ulcer of right lower extremity: Secondary | ICD-10-CM | POA: Diagnosis not present

## 2024-02-13 DIAGNOSIS — I890 Lymphedema, not elsewhere classified: Secondary | ICD-10-CM | POA: Diagnosis not present

## 2024-02-13 DIAGNOSIS — Z7901 Long term (current) use of anticoagulants: Secondary | ICD-10-CM | POA: Diagnosis not present

## 2024-02-13 DIAGNOSIS — L97812 Non-pressure chronic ulcer of other part of right lower leg with fat layer exposed: Secondary | ICD-10-CM | POA: Insufficient documentation

## 2024-02-13 DIAGNOSIS — I4891 Unspecified atrial fibrillation: Secondary | ICD-10-CM | POA: Insufficient documentation

## 2024-02-18 DIAGNOSIS — Z7901 Long term (current) use of anticoagulants: Secondary | ICD-10-CM | POA: Diagnosis not present

## 2024-02-18 DIAGNOSIS — F32A Depression, unspecified: Secondary | ICD-10-CM | POA: Diagnosis not present

## 2024-02-18 DIAGNOSIS — E559 Vitamin D deficiency, unspecified: Secondary | ICD-10-CM | POA: Diagnosis not present

## 2024-02-18 DIAGNOSIS — I739 Peripheral vascular disease, unspecified: Secondary | ICD-10-CM | POA: Diagnosis not present

## 2024-02-18 DIAGNOSIS — I890 Lymphedema, not elsewhere classified: Secondary | ICD-10-CM | POA: Diagnosis not present

## 2024-02-18 DIAGNOSIS — E782 Mixed hyperlipidemia: Secondary | ICD-10-CM | POA: Diagnosis not present

## 2024-02-18 DIAGNOSIS — I87321 Chronic venous hypertension (idiopathic) with inflammation of right lower extremity: Secondary | ICD-10-CM | POA: Diagnosis not present

## 2024-02-18 DIAGNOSIS — R600 Localized edema: Secondary | ICD-10-CM | POA: Diagnosis not present

## 2024-02-18 DIAGNOSIS — G8929 Other chronic pain: Secondary | ICD-10-CM | POA: Diagnosis not present

## 2024-02-18 DIAGNOSIS — F0394 Unspecified dementia, unspecified severity, with anxiety: Secondary | ICD-10-CM | POA: Diagnosis not present

## 2024-02-18 DIAGNOSIS — I482 Chronic atrial fibrillation, unspecified: Secondary | ICD-10-CM | POA: Diagnosis not present

## 2024-02-18 DIAGNOSIS — F0393 Unspecified dementia, unspecified severity, with mood disturbance: Secondary | ICD-10-CM | POA: Diagnosis not present

## 2024-02-18 DIAGNOSIS — L97811 Non-pressure chronic ulcer of other part of right lower leg limited to breakdown of skin: Secondary | ICD-10-CM | POA: Diagnosis not present

## 2024-02-18 DIAGNOSIS — F4321 Adjustment disorder with depressed mood: Secondary | ICD-10-CM | POA: Diagnosis not present

## 2024-02-21 DIAGNOSIS — I87321 Chronic venous hypertension (idiopathic) with inflammation of right lower extremity: Secondary | ICD-10-CM | POA: Diagnosis not present

## 2024-02-21 DIAGNOSIS — F0394 Unspecified dementia, unspecified severity, with anxiety: Secondary | ICD-10-CM | POA: Diagnosis not present

## 2024-02-21 DIAGNOSIS — G8929 Other chronic pain: Secondary | ICD-10-CM | POA: Diagnosis not present

## 2024-02-21 DIAGNOSIS — I739 Peripheral vascular disease, unspecified: Secondary | ICD-10-CM | POA: Diagnosis not present

## 2024-02-21 DIAGNOSIS — F32A Depression, unspecified: Secondary | ICD-10-CM | POA: Diagnosis not present

## 2024-02-21 DIAGNOSIS — Z7901 Long term (current) use of anticoagulants: Secondary | ICD-10-CM | POA: Diagnosis not present

## 2024-02-21 DIAGNOSIS — E782 Mixed hyperlipidemia: Secondary | ICD-10-CM | POA: Diagnosis not present

## 2024-02-21 DIAGNOSIS — F0393 Unspecified dementia, unspecified severity, with mood disturbance: Secondary | ICD-10-CM | POA: Diagnosis not present

## 2024-02-21 DIAGNOSIS — I890 Lymphedema, not elsewhere classified: Secondary | ICD-10-CM | POA: Diagnosis not present

## 2024-02-21 DIAGNOSIS — R600 Localized edema: Secondary | ICD-10-CM | POA: Diagnosis not present

## 2024-02-21 DIAGNOSIS — L97811 Non-pressure chronic ulcer of other part of right lower leg limited to breakdown of skin: Secondary | ICD-10-CM | POA: Diagnosis not present

## 2024-02-21 DIAGNOSIS — E559 Vitamin D deficiency, unspecified: Secondary | ICD-10-CM | POA: Diagnosis not present

## 2024-02-21 DIAGNOSIS — I482 Chronic atrial fibrillation, unspecified: Secondary | ICD-10-CM | POA: Diagnosis not present

## 2024-02-21 DIAGNOSIS — F4321 Adjustment disorder with depressed mood: Secondary | ICD-10-CM | POA: Diagnosis not present

## 2024-02-25 DIAGNOSIS — E782 Mixed hyperlipidemia: Secondary | ICD-10-CM | POA: Diagnosis not present

## 2024-02-25 DIAGNOSIS — L97811 Non-pressure chronic ulcer of other part of right lower leg limited to breakdown of skin: Secondary | ICD-10-CM | POA: Diagnosis not present

## 2024-02-25 DIAGNOSIS — I739 Peripheral vascular disease, unspecified: Secondary | ICD-10-CM | POA: Diagnosis not present

## 2024-02-25 DIAGNOSIS — Z7901 Long term (current) use of anticoagulants: Secondary | ICD-10-CM | POA: Diagnosis not present

## 2024-02-25 DIAGNOSIS — F0393 Unspecified dementia, unspecified severity, with mood disturbance: Secondary | ICD-10-CM | POA: Diagnosis not present

## 2024-02-25 DIAGNOSIS — F32A Depression, unspecified: Secondary | ICD-10-CM | POA: Diagnosis not present

## 2024-02-25 DIAGNOSIS — R600 Localized edema: Secondary | ICD-10-CM | POA: Diagnosis not present

## 2024-02-25 DIAGNOSIS — F0394 Unspecified dementia, unspecified severity, with anxiety: Secondary | ICD-10-CM | POA: Diagnosis not present

## 2024-02-25 DIAGNOSIS — E559 Vitamin D deficiency, unspecified: Secondary | ICD-10-CM | POA: Diagnosis not present

## 2024-02-25 DIAGNOSIS — I890 Lymphedema, not elsewhere classified: Secondary | ICD-10-CM | POA: Diagnosis not present

## 2024-02-25 DIAGNOSIS — G8929 Other chronic pain: Secondary | ICD-10-CM | POA: Diagnosis not present

## 2024-02-25 DIAGNOSIS — I482 Chronic atrial fibrillation, unspecified: Secondary | ICD-10-CM | POA: Diagnosis not present

## 2024-02-25 DIAGNOSIS — F4321 Adjustment disorder with depressed mood: Secondary | ICD-10-CM | POA: Diagnosis not present

## 2024-02-25 DIAGNOSIS — I87321 Chronic venous hypertension (idiopathic) with inflammation of right lower extremity: Secondary | ICD-10-CM | POA: Diagnosis not present

## 2024-02-27 ENCOUNTER — Encounter (HOSPITAL_BASED_OUTPATIENT_CLINIC_OR_DEPARTMENT_OTHER): Admitting: Internal Medicine

## 2024-02-28 DIAGNOSIS — F411 Generalized anxiety disorder: Secondary | ICD-10-CM | POA: Diagnosis not present

## 2024-02-28 DIAGNOSIS — I482 Chronic atrial fibrillation, unspecified: Secondary | ICD-10-CM | POA: Diagnosis not present

## 2024-02-28 DIAGNOSIS — R600 Localized edema: Secondary | ICD-10-CM | POA: Diagnosis not present

## 2024-02-28 DIAGNOSIS — L97811 Non-pressure chronic ulcer of other part of right lower leg limited to breakdown of skin: Secondary | ICD-10-CM | POA: Diagnosis not present

## 2024-02-28 DIAGNOSIS — F0390 Unspecified dementia without behavioral disturbance: Secondary | ICD-10-CM | POA: Diagnosis not present

## 2024-02-28 DIAGNOSIS — E782 Mixed hyperlipidemia: Secondary | ICD-10-CM | POA: Diagnosis not present

## 2024-02-28 DIAGNOSIS — I10 Essential (primary) hypertension: Secondary | ICD-10-CM | POA: Diagnosis not present

## 2024-02-28 DIAGNOSIS — F4321 Adjustment disorder with depressed mood: Secondary | ICD-10-CM | POA: Diagnosis not present

## 2024-02-28 DIAGNOSIS — Z7901 Long term (current) use of anticoagulants: Secondary | ICD-10-CM | POA: Diagnosis not present

## 2024-02-28 DIAGNOSIS — F32A Depression, unspecified: Secondary | ICD-10-CM | POA: Diagnosis not present

## 2024-02-28 DIAGNOSIS — I87321 Chronic venous hypertension (idiopathic) with inflammation of right lower extremity: Secondary | ICD-10-CM | POA: Diagnosis not present

## 2024-02-28 DIAGNOSIS — G309 Alzheimer's disease, unspecified: Secondary | ICD-10-CM | POA: Diagnosis not present

## 2024-02-28 DIAGNOSIS — E559 Vitamin D deficiency, unspecified: Secondary | ICD-10-CM | POA: Diagnosis not present

## 2024-02-28 DIAGNOSIS — F0393 Unspecified dementia, unspecified severity, with mood disturbance: Secondary | ICD-10-CM | POA: Diagnosis not present

## 2024-02-28 DIAGNOSIS — I890 Lymphedema, not elsewhere classified: Secondary | ICD-10-CM | POA: Diagnosis not present

## 2024-02-28 DIAGNOSIS — F0394 Unspecified dementia, unspecified severity, with anxiety: Secondary | ICD-10-CM | POA: Diagnosis not present

## 2024-02-28 DIAGNOSIS — I739 Peripheral vascular disease, unspecified: Secondary | ICD-10-CM | POA: Diagnosis not present

## 2024-02-28 DIAGNOSIS — G8929 Other chronic pain: Secondary | ICD-10-CM | POA: Diagnosis not present

## 2024-03-03 DIAGNOSIS — G8929 Other chronic pain: Secondary | ICD-10-CM | POA: Diagnosis not present

## 2024-03-03 DIAGNOSIS — R600 Localized edema: Secondary | ICD-10-CM | POA: Diagnosis not present

## 2024-03-03 DIAGNOSIS — I890 Lymphedema, not elsewhere classified: Secondary | ICD-10-CM | POA: Diagnosis not present

## 2024-03-03 DIAGNOSIS — F0394 Unspecified dementia, unspecified severity, with anxiety: Secondary | ICD-10-CM | POA: Diagnosis not present

## 2024-03-03 DIAGNOSIS — F0393 Unspecified dementia, unspecified severity, with mood disturbance: Secondary | ICD-10-CM | POA: Diagnosis not present

## 2024-03-03 DIAGNOSIS — I739 Peripheral vascular disease, unspecified: Secondary | ICD-10-CM | POA: Diagnosis not present

## 2024-03-03 DIAGNOSIS — F4321 Adjustment disorder with depressed mood: Secondary | ICD-10-CM | POA: Diagnosis not present

## 2024-03-03 DIAGNOSIS — I87321 Chronic venous hypertension (idiopathic) with inflammation of right lower extremity: Secondary | ICD-10-CM | POA: Diagnosis not present

## 2024-03-03 DIAGNOSIS — I482 Chronic atrial fibrillation, unspecified: Secondary | ICD-10-CM | POA: Diagnosis not present

## 2024-03-03 DIAGNOSIS — F32A Depression, unspecified: Secondary | ICD-10-CM | POA: Diagnosis not present

## 2024-03-03 DIAGNOSIS — E782 Mixed hyperlipidemia: Secondary | ICD-10-CM | POA: Diagnosis not present

## 2024-03-03 DIAGNOSIS — E559 Vitamin D deficiency, unspecified: Secondary | ICD-10-CM | POA: Diagnosis not present

## 2024-03-03 DIAGNOSIS — Z7901 Long term (current) use of anticoagulants: Secondary | ICD-10-CM | POA: Diagnosis not present

## 2024-03-03 DIAGNOSIS — L97811 Non-pressure chronic ulcer of other part of right lower leg limited to breakdown of skin: Secondary | ICD-10-CM | POA: Diagnosis not present

## 2024-03-06 DIAGNOSIS — F4321 Adjustment disorder with depressed mood: Secondary | ICD-10-CM | POA: Diagnosis not present

## 2024-03-06 DIAGNOSIS — L97811 Non-pressure chronic ulcer of other part of right lower leg limited to breakdown of skin: Secondary | ICD-10-CM | POA: Diagnosis not present

## 2024-03-06 DIAGNOSIS — F0394 Unspecified dementia, unspecified severity, with anxiety: Secondary | ICD-10-CM | POA: Diagnosis not present

## 2024-03-06 DIAGNOSIS — R600 Localized edema: Secondary | ICD-10-CM | POA: Diagnosis not present

## 2024-03-06 DIAGNOSIS — F0393 Unspecified dementia, unspecified severity, with mood disturbance: Secondary | ICD-10-CM | POA: Diagnosis not present

## 2024-03-06 DIAGNOSIS — I87321 Chronic venous hypertension (idiopathic) with inflammation of right lower extremity: Secondary | ICD-10-CM | POA: Diagnosis not present

## 2024-03-06 DIAGNOSIS — I739 Peripheral vascular disease, unspecified: Secondary | ICD-10-CM | POA: Diagnosis not present

## 2024-03-06 DIAGNOSIS — E782 Mixed hyperlipidemia: Secondary | ICD-10-CM | POA: Diagnosis not present

## 2024-03-06 DIAGNOSIS — F32A Depression, unspecified: Secondary | ICD-10-CM | POA: Diagnosis not present

## 2024-03-06 DIAGNOSIS — E559 Vitamin D deficiency, unspecified: Secondary | ICD-10-CM | POA: Diagnosis not present

## 2024-03-06 DIAGNOSIS — G8929 Other chronic pain: Secondary | ICD-10-CM | POA: Diagnosis not present

## 2024-03-06 DIAGNOSIS — I890 Lymphedema, not elsewhere classified: Secondary | ICD-10-CM | POA: Diagnosis not present

## 2024-03-06 DIAGNOSIS — I482 Chronic atrial fibrillation, unspecified: Secondary | ICD-10-CM | POA: Diagnosis not present

## 2024-03-06 DIAGNOSIS — Z7901 Long term (current) use of anticoagulants: Secondary | ICD-10-CM | POA: Diagnosis not present

## 2024-03-09 IMAGING — CR DG FOOT 2V*L*
2 series · 2 of 2 positions shown · non-contrast
Comparison: None.

CLINICAL DATA: Bilateral lower extremity wounds.

EXAM:
LEFT FOOT - 2 VIEW

[x foot lat left]
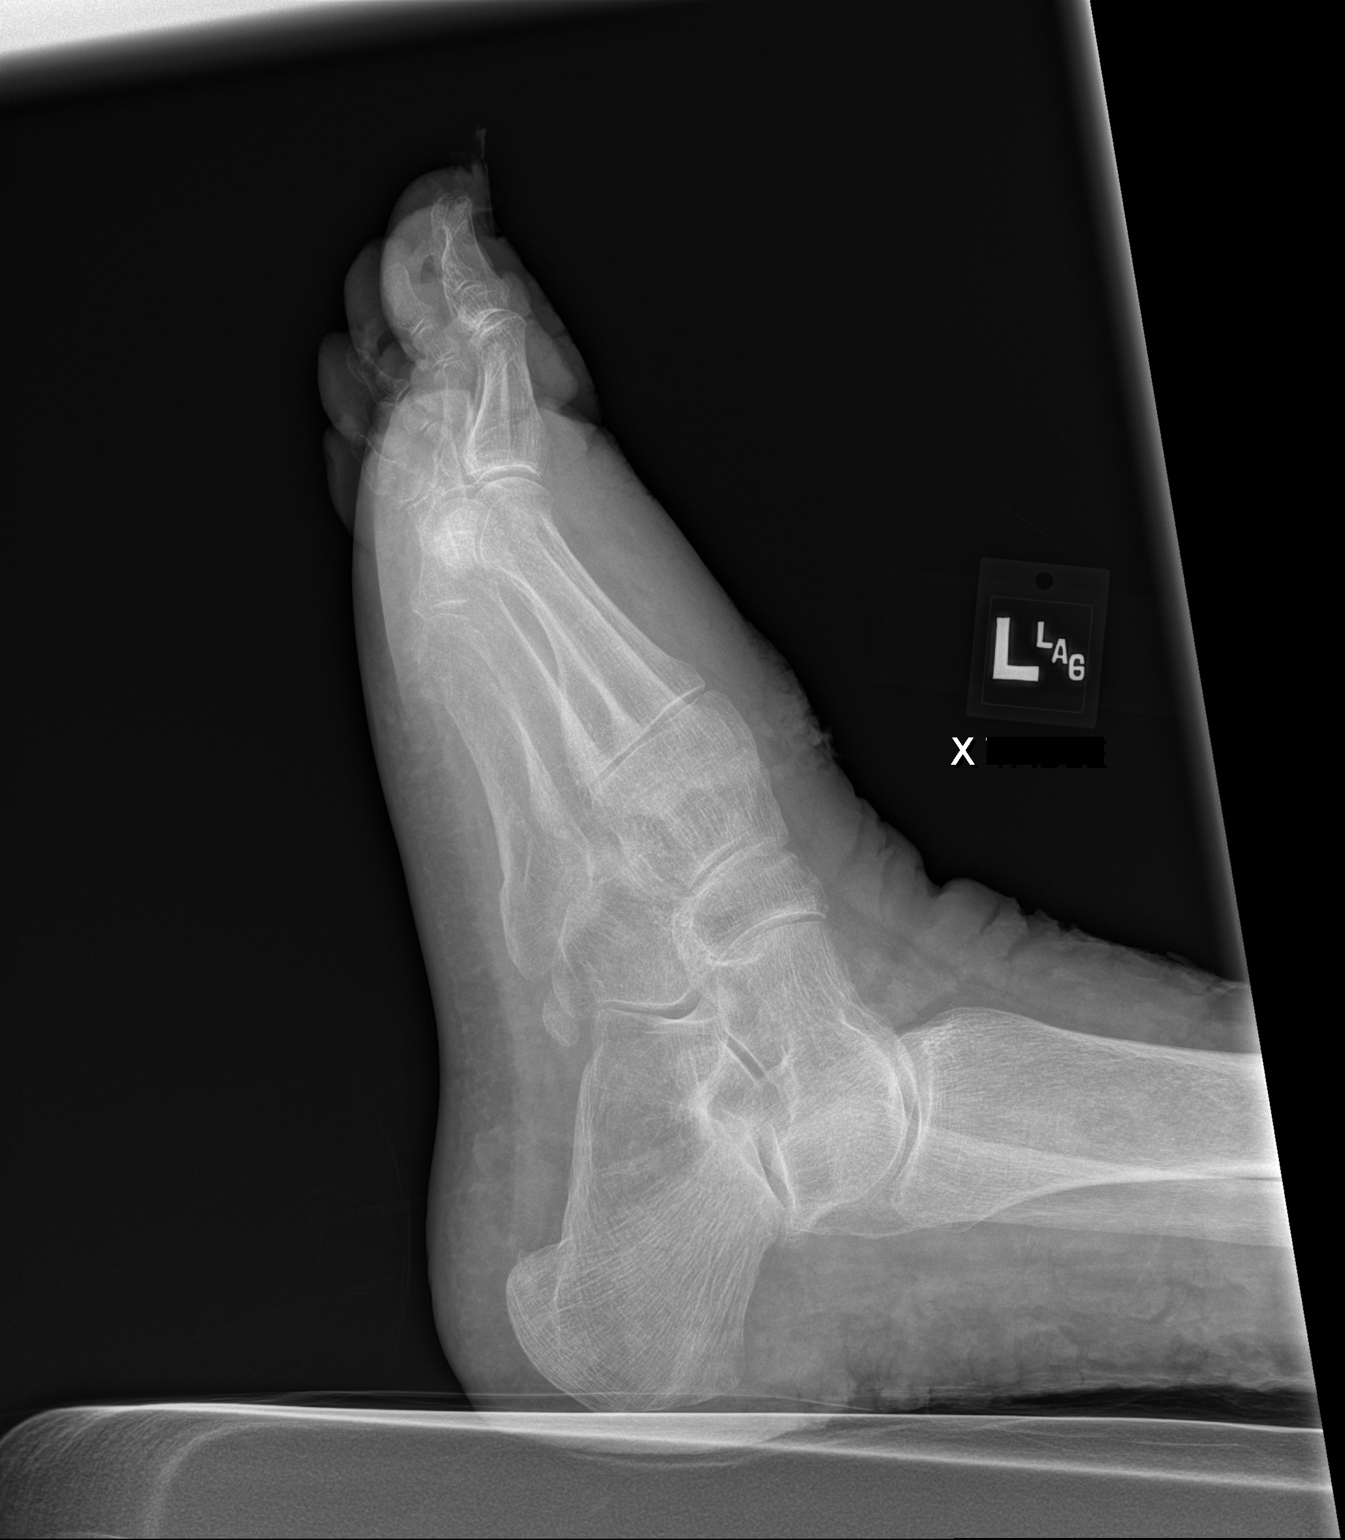

[x foot ap left]
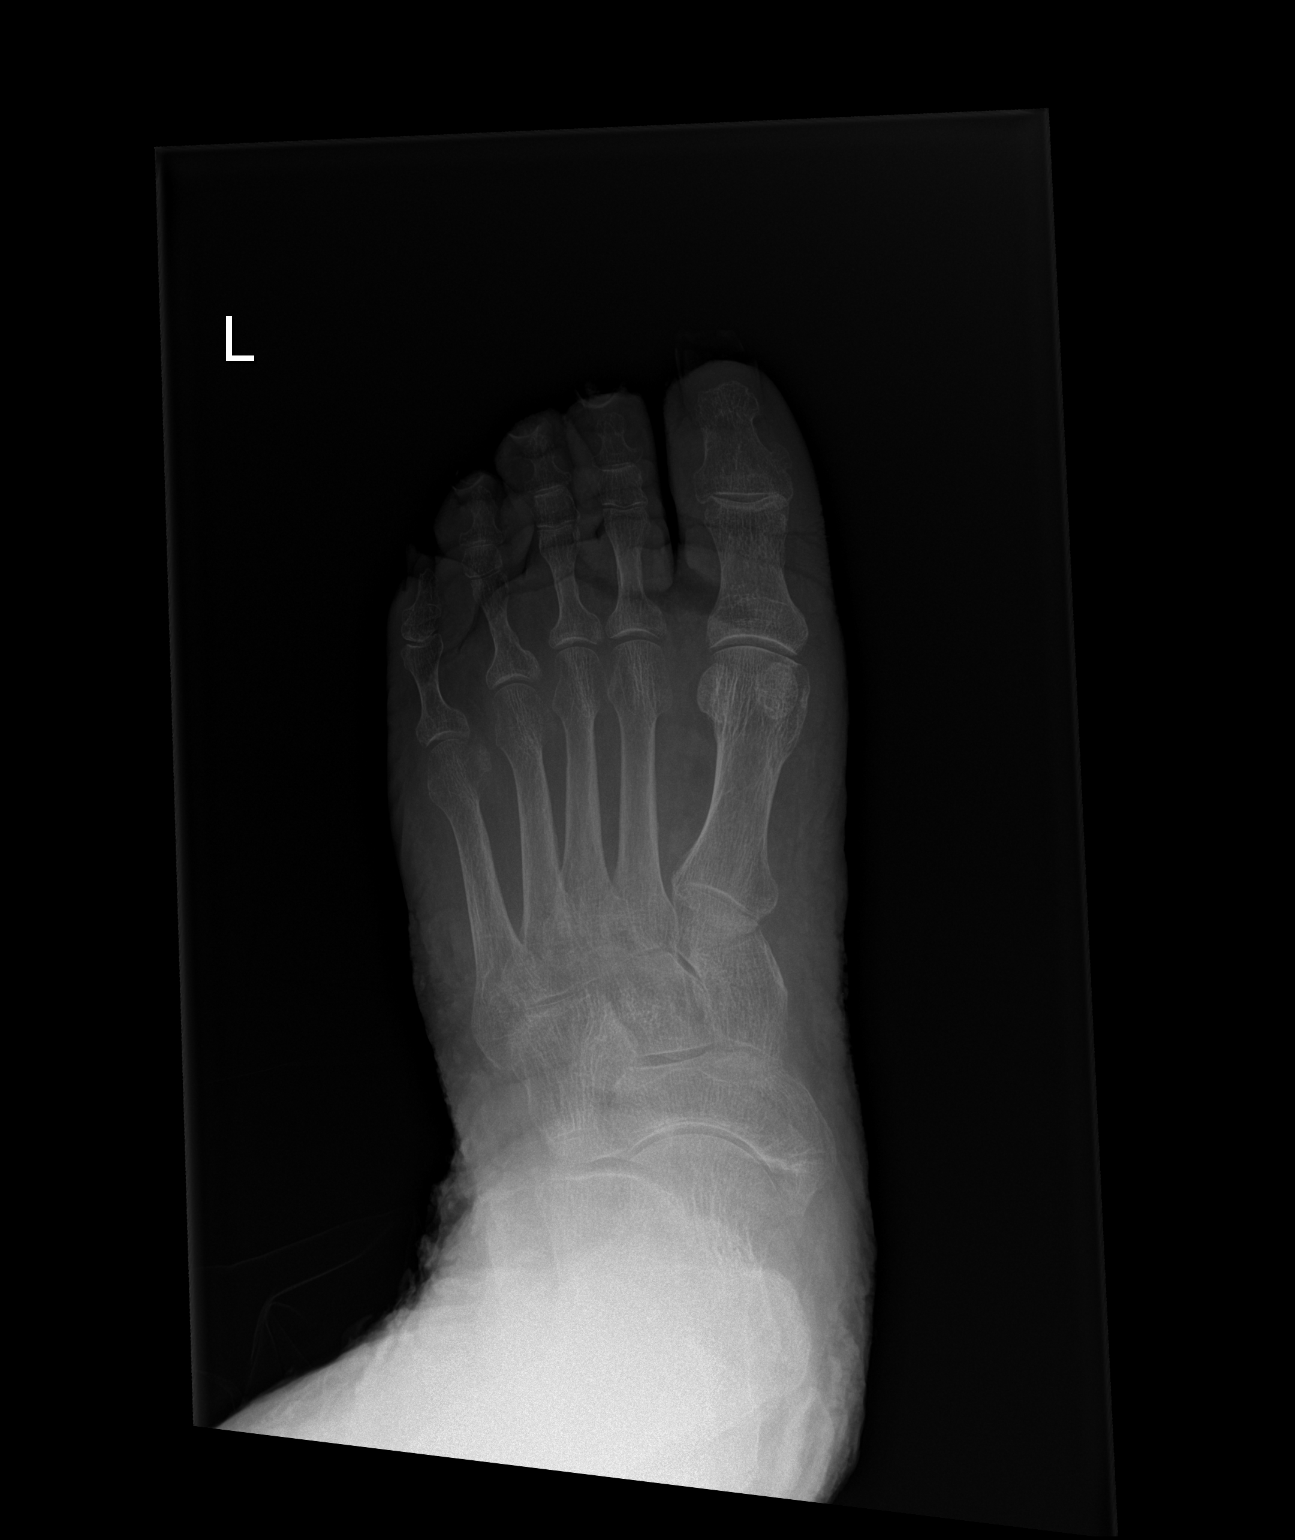

[2 of 2 positions shown; findings below may reference images not displayed]

FINDINGS: There is no evidence of fracture or dislocation. There is osseous
erosion or severe osteopenia of the tibial aspect of the fifth
metatarsal head. Marked skin thickening and subcutaneous soft
tissue edema prominent about the dorsum of the foot. There is
calcification of the nail beds.
IMPRESSION: Osseous erosion or severe osteopenia about the tibial aspect of the
fifth metatarsal head. In the presence of multiple skin wounds
osteomyelitis can not be excluded. Differential includes osteopenia
or metabolic disorders. Further evaluation with MRI examination
would be helpful.

## 2024-03-09 IMAGING — CR DG TIBIA/FIBULA 2V*R*
4 series · 4 of 4 positions shown · non-contrast
Comparison: None.

CLINICAL DATA: Bilateral lower extremity skin wounds.

EXAM:
RIGHT TIBIA AND FIBULA - 2 VIEW

[x tib-fib ap right]
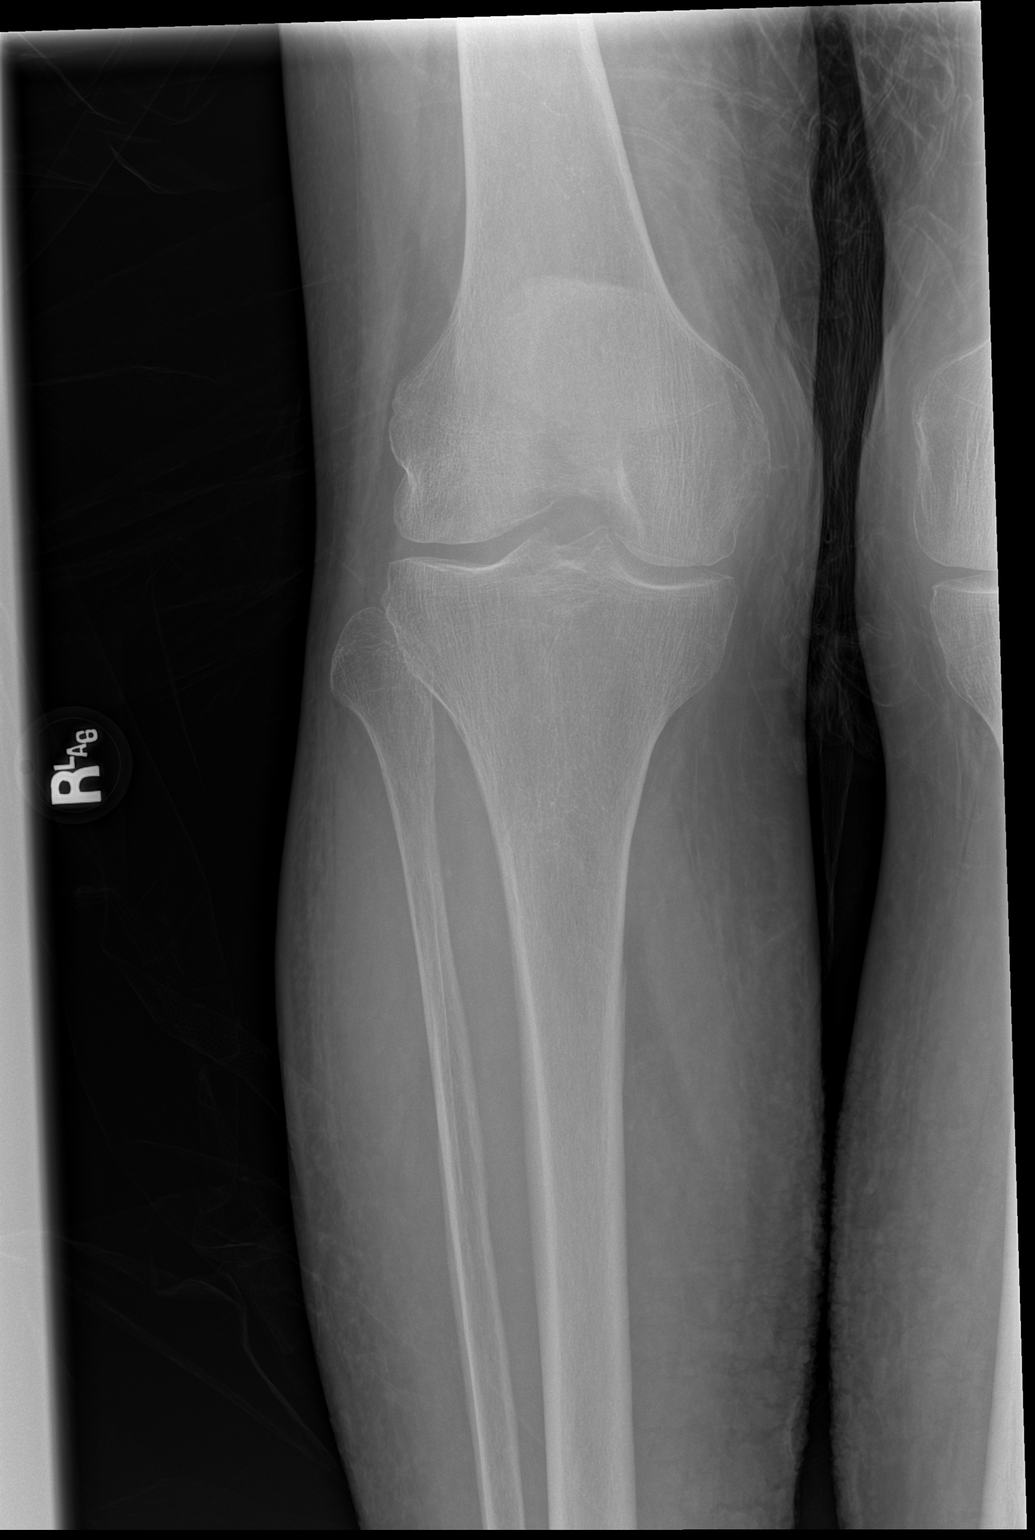

[x tib-fib lat right (1 of 3)]
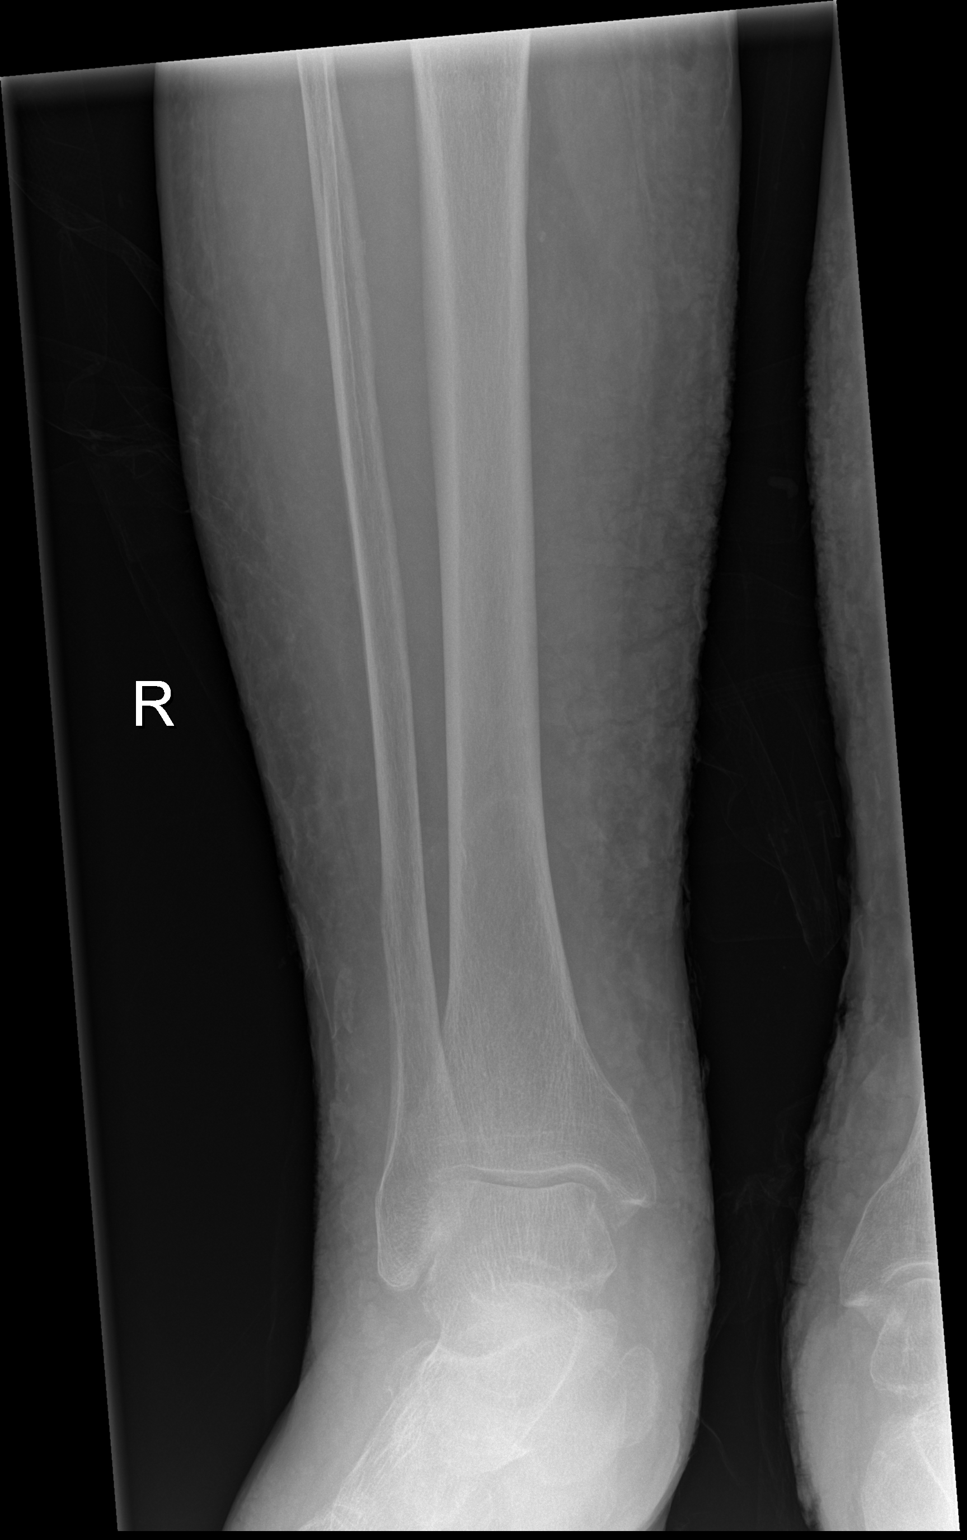

[x tib-fib lat right (2 of 3)]
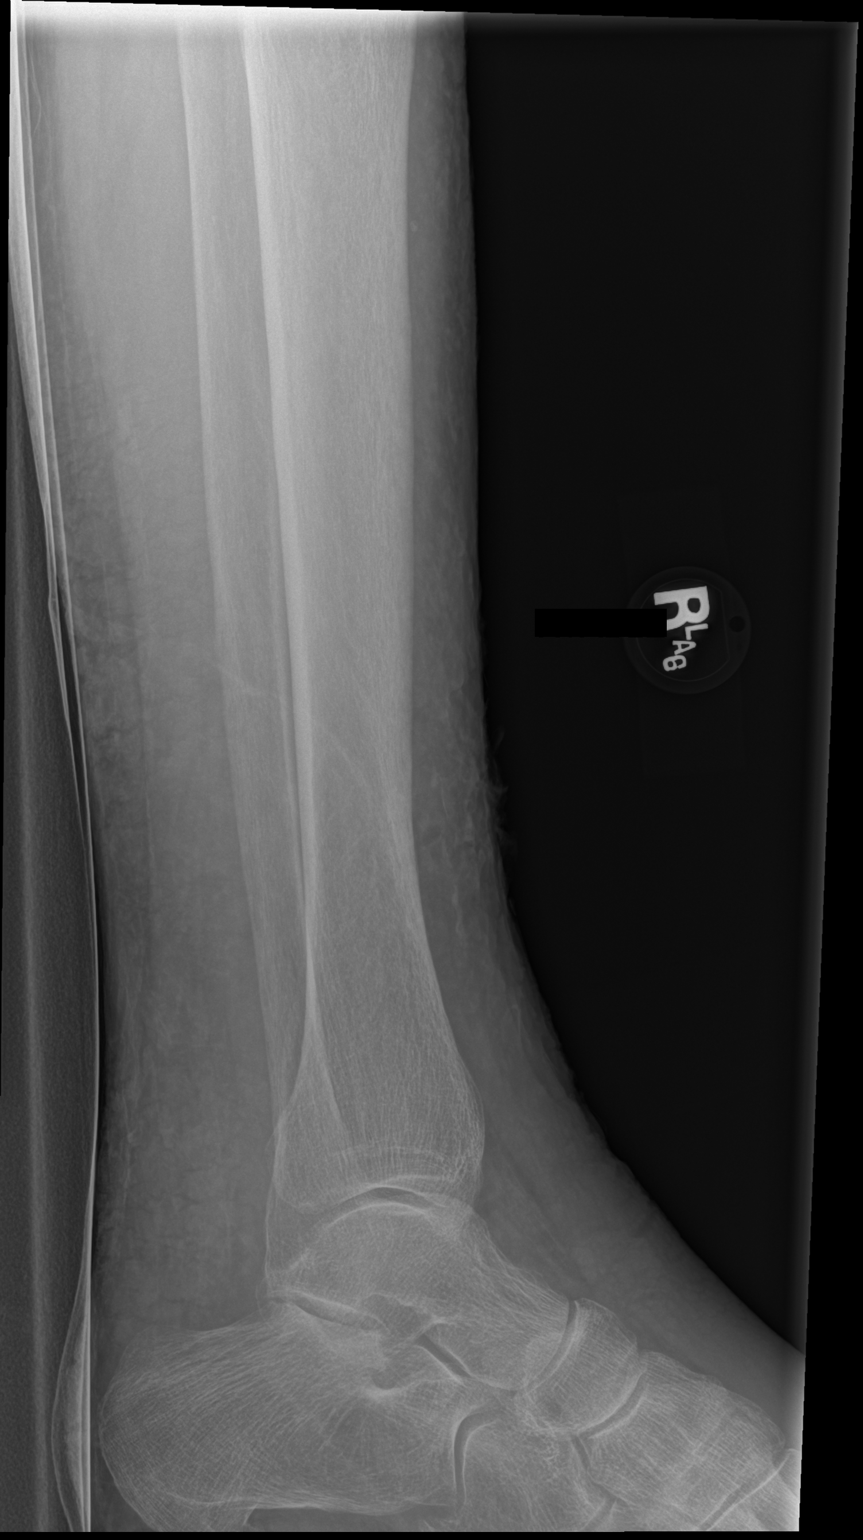

[x tib-fib lat right (3 of 3)]
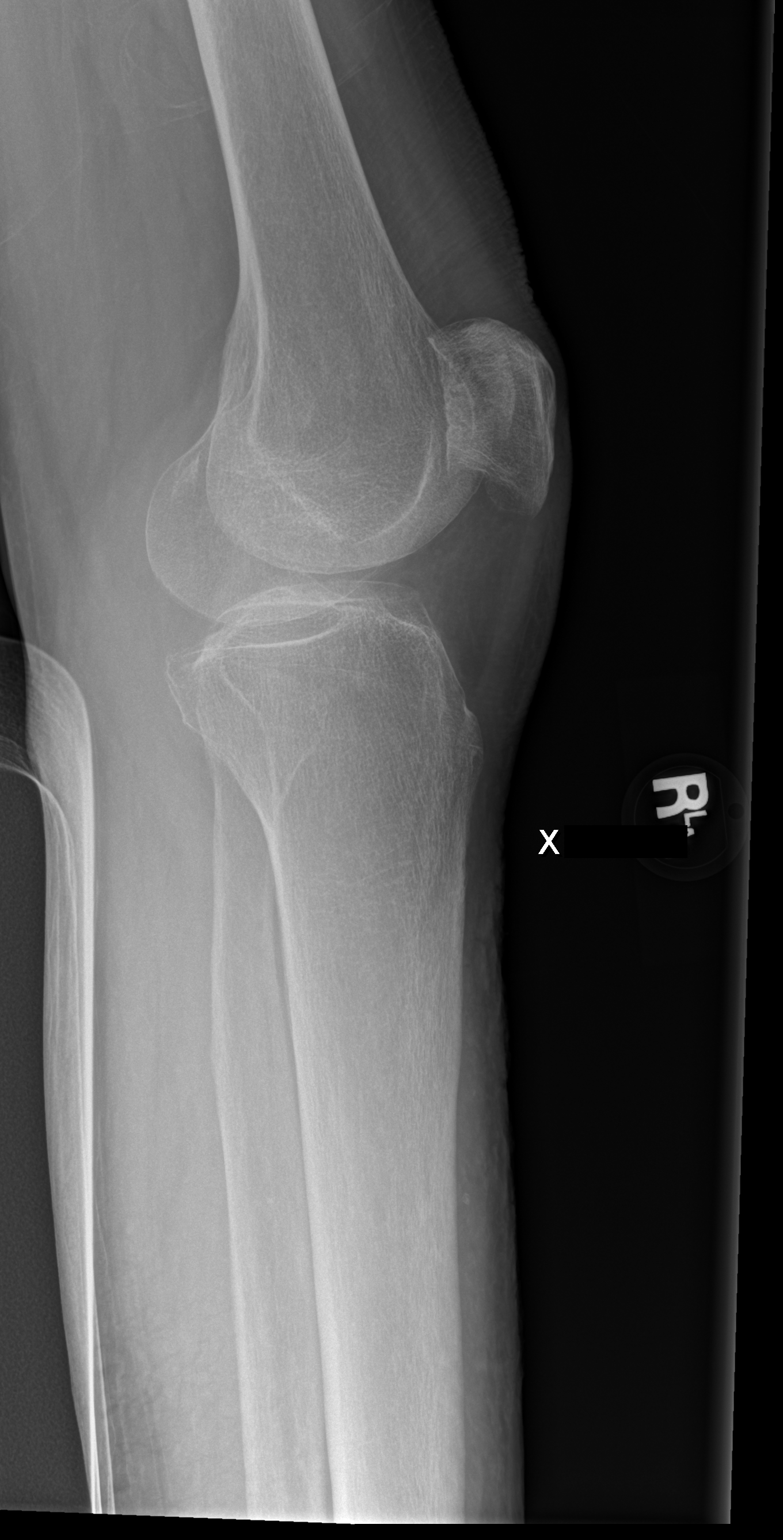

[4 of 4 positions shown; findings below may reference images not displayed]

FINDINGS: There is no evidence of fracture. No cortical erosion or periosteal
reaction to suggest acute osteomyelitis. Skin irregularity
consistent with history of multiple skin wounds.
IMPRESSION: 1. No cortical erosion or periosteal reaction to suggest acute
osteomyelitis.
2. Skin irregularity consistent with history of skin wounds.

## 2024-03-09 IMAGING — CR DG TIBIA/FIBULA 2V*L*
4 series · 4 of 4 positions shown · non-contrast
Comparison: None.

CLINICAL DATA: Bilateral lower leg wounds. Necrosis and weeping
wounds entire both tibia/fibula and feet.

EXAM:
LEFT TIBIA AND FIBULA - 2 VIEW

[x tib-fib ap left (1 of 2)]
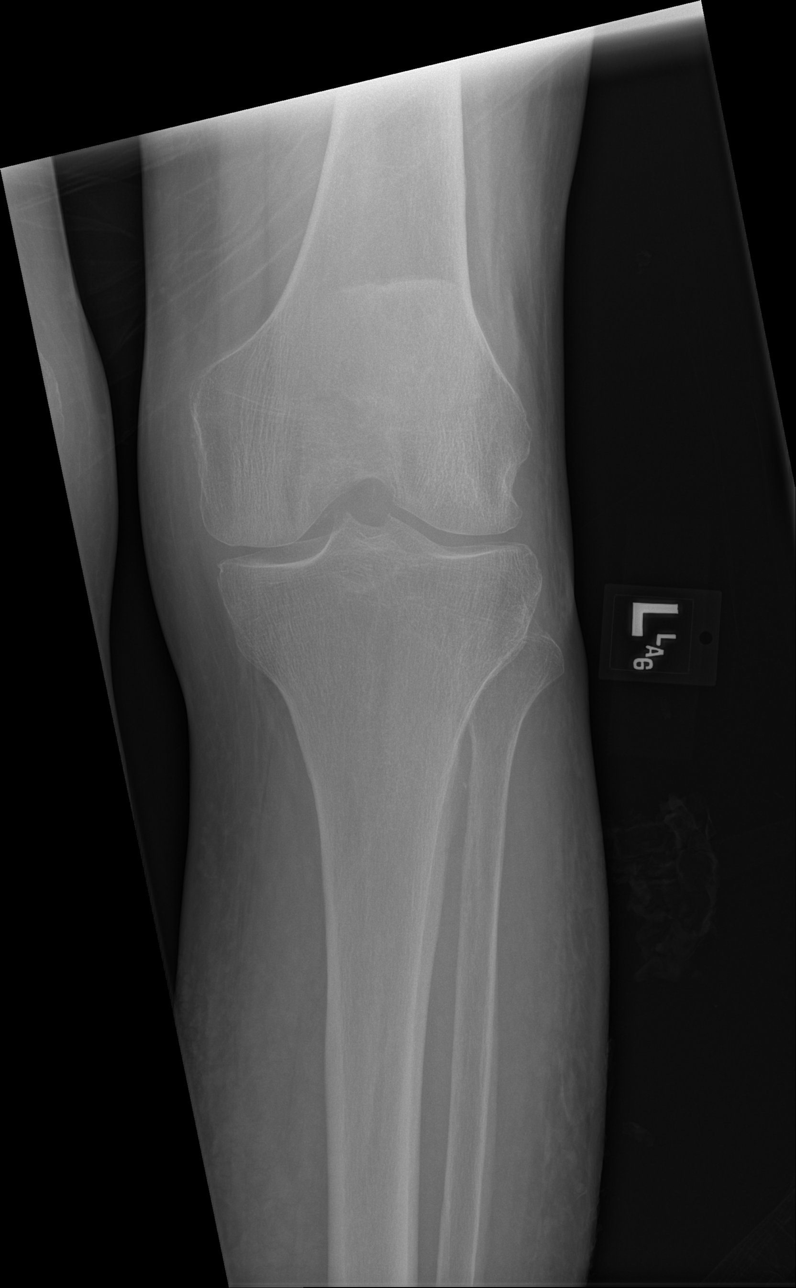

[x tib-fib ap left (2 of 2)]
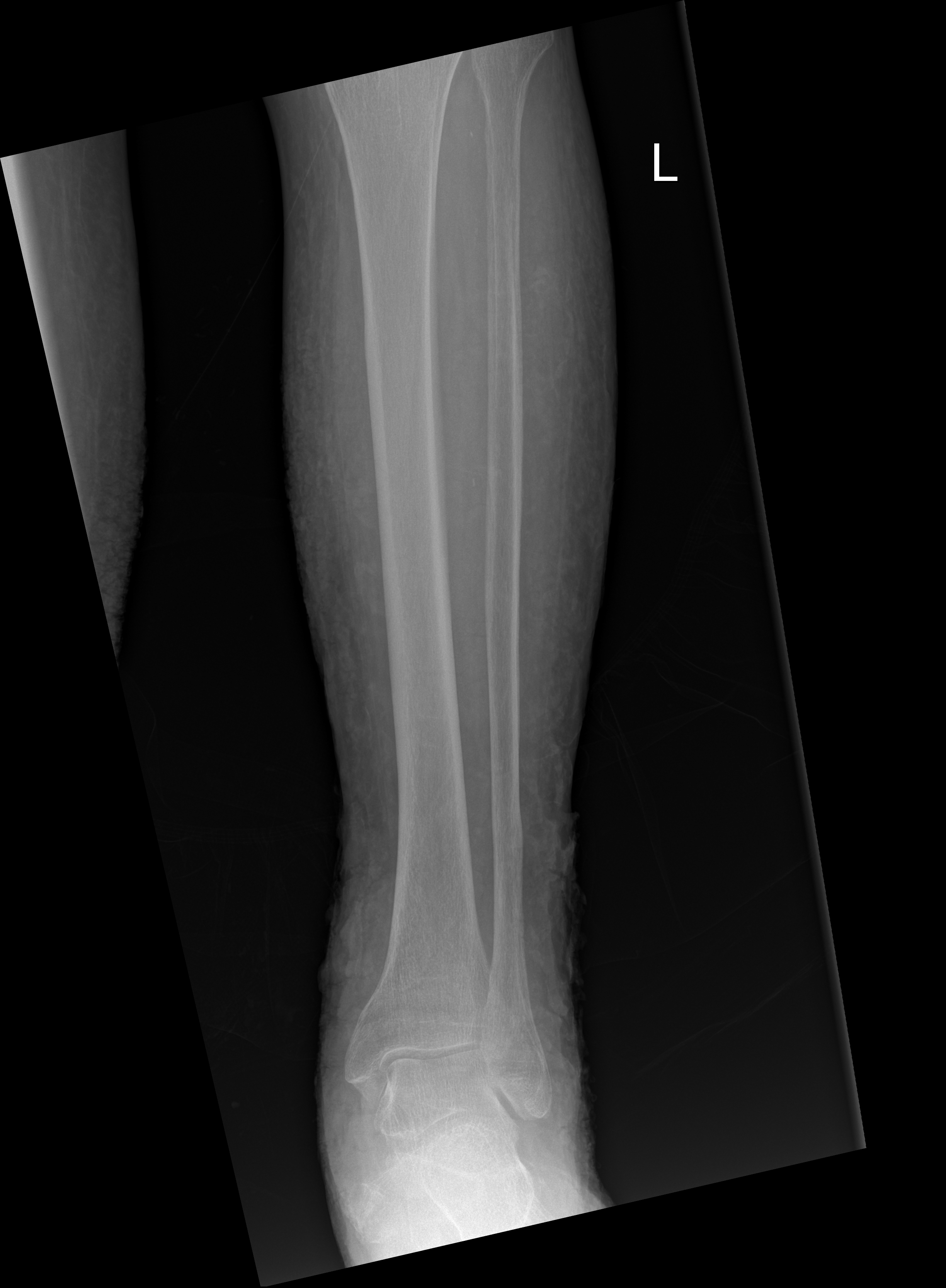

[x tib-fib lat left (1 of 2)]
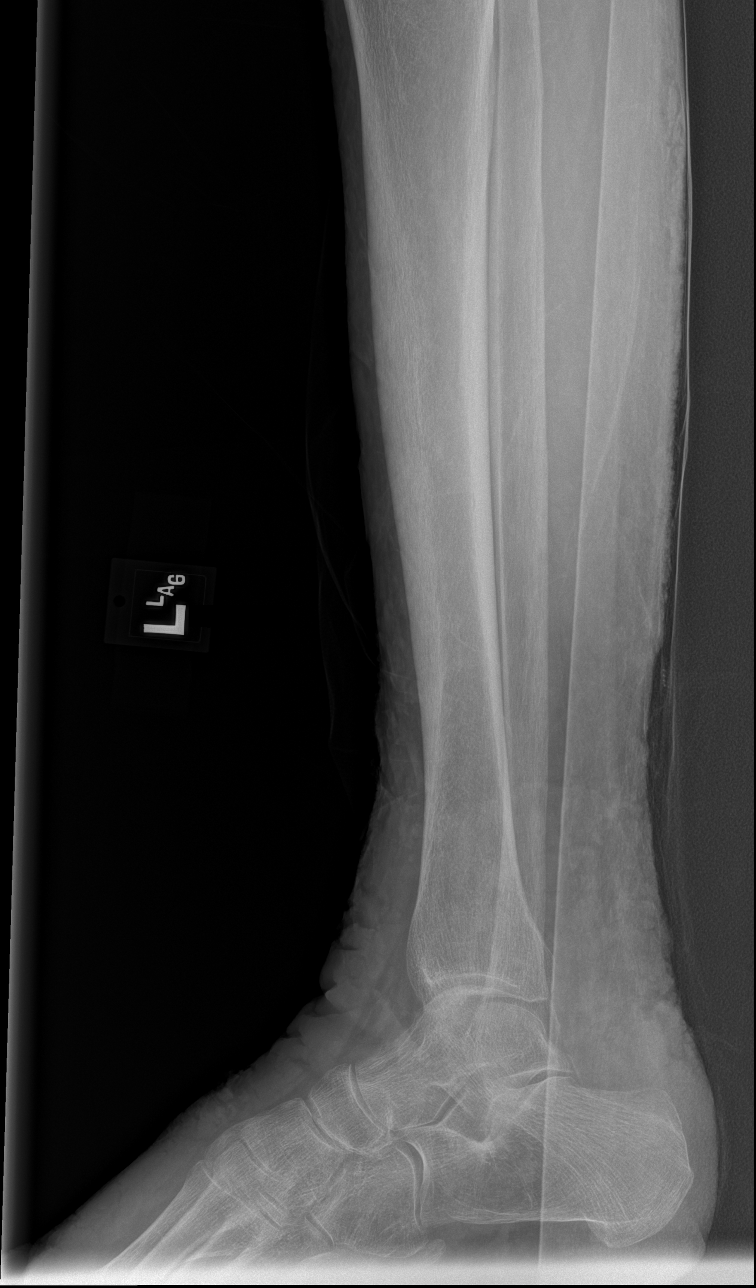

[x tib-fib lat left (2 of 2)]
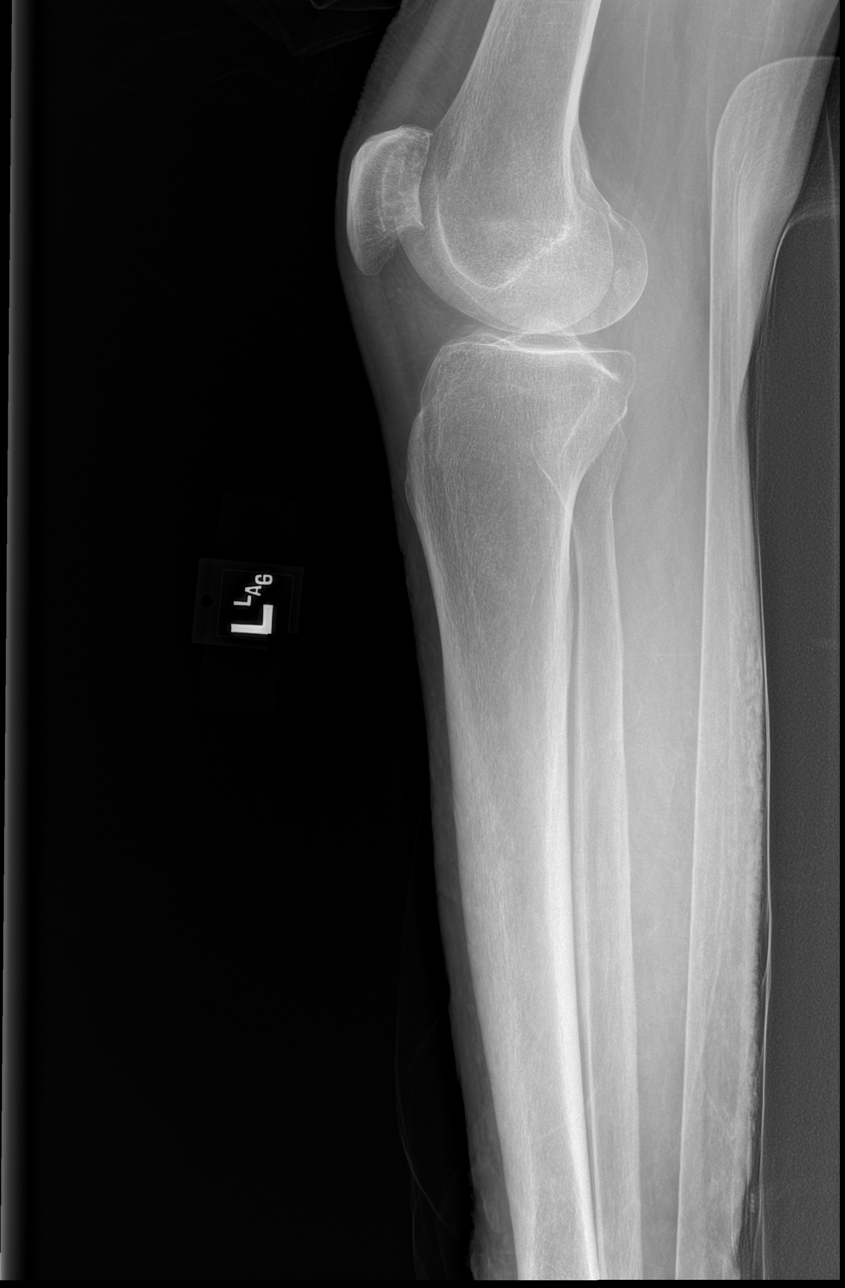

[4 of 4 positions shown; findings below may reference images not displayed]

FINDINGS: There is no evidence of fracture or other focal bone lesions. No
cortical erosion or periosteal reaction. Skin thickening and
irregularity consistent with multiple skin consistent with patient's
history.
IMPRESSION: No acute osseous abnormality. No radiographic evidence of
osteomyelitis.

Skin irregularity consistent with history of skin wounds.

## 2024-03-10 ENCOUNTER — Encounter (HOSPITAL_BASED_OUTPATIENT_CLINIC_OR_DEPARTMENT_OTHER): Admitting: Internal Medicine

## 2024-03-11 DIAGNOSIS — L97811 Non-pressure chronic ulcer of other part of right lower leg limited to breakdown of skin: Secondary | ICD-10-CM | POA: Diagnosis not present

## 2024-03-11 DIAGNOSIS — E559 Vitamin D deficiency, unspecified: Secondary | ICD-10-CM | POA: Diagnosis not present

## 2024-03-11 DIAGNOSIS — I482 Chronic atrial fibrillation, unspecified: Secondary | ICD-10-CM | POA: Diagnosis not present

## 2024-03-11 DIAGNOSIS — I87321 Chronic venous hypertension (idiopathic) with inflammation of right lower extremity: Secondary | ICD-10-CM | POA: Diagnosis not present

## 2024-03-11 DIAGNOSIS — F0394 Unspecified dementia, unspecified severity, with anxiety: Secondary | ICD-10-CM | POA: Diagnosis not present

## 2024-03-11 DIAGNOSIS — F32A Depression, unspecified: Secondary | ICD-10-CM | POA: Diagnosis not present

## 2024-03-11 DIAGNOSIS — F0393 Unspecified dementia, unspecified severity, with mood disturbance: Secondary | ICD-10-CM | POA: Diagnosis not present

## 2024-03-11 DIAGNOSIS — F4321 Adjustment disorder with depressed mood: Secondary | ICD-10-CM | POA: Diagnosis not present

## 2024-03-11 DIAGNOSIS — Z7901 Long term (current) use of anticoagulants: Secondary | ICD-10-CM | POA: Diagnosis not present

## 2024-03-11 DIAGNOSIS — I890 Lymphedema, not elsewhere classified: Secondary | ICD-10-CM | POA: Diagnosis not present

## 2024-03-11 DIAGNOSIS — I739 Peripheral vascular disease, unspecified: Secondary | ICD-10-CM | POA: Diagnosis not present

## 2024-03-11 DIAGNOSIS — E782 Mixed hyperlipidemia: Secondary | ICD-10-CM | POA: Diagnosis not present

## 2024-03-11 DIAGNOSIS — R600 Localized edema: Secondary | ICD-10-CM | POA: Diagnosis not present

## 2024-03-11 DIAGNOSIS — G8929 Other chronic pain: Secondary | ICD-10-CM | POA: Diagnosis not present

## 2024-03-12 ENCOUNTER — Ambulatory Visit: Attending: Student in an Organized Health Care Education/Training Program | Admitting: Cardiovascular Disease

## 2024-03-12 ENCOUNTER — Encounter: Payer: Self-pay | Admitting: Cardiovascular Disease

## 2024-03-12 VITALS — BP 116/60 | HR 89 | Ht 71.0 in | Wt 187.6 lb

## 2024-03-12 DIAGNOSIS — I4819 Other persistent atrial fibrillation: Secondary | ICD-10-CM | POA: Diagnosis not present

## 2024-03-12 DIAGNOSIS — I351 Nonrheumatic aortic (valve) insufficiency: Secondary | ICD-10-CM | POA: Diagnosis not present

## 2024-03-12 DIAGNOSIS — E7800 Pure hypercholesterolemia, unspecified: Secondary | ICD-10-CM

## 2024-03-12 DIAGNOSIS — I2510 Atherosclerotic heart disease of native coronary artery without angina pectoris: Secondary | ICD-10-CM | POA: Diagnosis not present

## 2024-03-12 DIAGNOSIS — I872 Venous insufficiency (chronic) (peripheral): Secondary | ICD-10-CM

## 2024-03-12 NOTE — Assessment & Plan Note (Addendum)
 Longstanding atrial fibrillation, intolerant to beta blockers and calcium  channel blockers.  Tolerating anticoagulation without bleeding problems.  Heart rate is reasonable off of AV nodal blockers.

## 2024-03-12 NOTE — Assessment & Plan Note (Addendum)
 2023 echo reviewed with mild AI, aortic sclerosis without stenosis.  No indication for repeat echo at this point.

## 2024-03-12 NOTE — Assessment & Plan Note (Signed)
 Appears clinically stable.  I reviewed his most recent labs with a creatinine of 1.29.  We had a lengthy discussion about the use of diuretics.  I recommended that he stay on torsemide 20 mg daily to help manage his fluid, even though it is not technically related to heart failure.  I suspect he would have more problems off of diuretic therapy.  He should also continue with current measures of close wound care follow-up, leg elevation when possible, and compression as needed.

## 2024-03-12 NOTE — Assessment & Plan Note (Signed)
 Treated with atorvastatin  10 mg daily.

## 2024-03-12 NOTE — Progress Notes (Signed)
 Cardiology Office Note:    Date:  03/12/2024   ID:  [name] [name], DOB 01-Mar-1941, MRN [id number]  PCP:  Bettie Santana LABOR, MD (Inactive)   [facility] Providers Cardiologist:  Ozell Fell, MD     Referring MD: No ref. provider found   Chief Complaint  Patient presents with   Atrial Fibrillation    History of Present Illness:    [name] [name] is a 83 y.o. 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  The patient is here with his daughter today.  He reports no symptoms of chest pain, chest pressure, shortness of breath, or heart palpitations.  He is tolerating oral anticoagulation without bleeding problems.  He has been under the care of the wound care center for his chronic lower extremity edema which may be combination of venous stasis and lymphedema.  He has been taking torsemide 20 mg daily, previously on 40 mg for a period of time.  He has had his legs wrapped.  He tried compression pumps at 1 point but this was not beneficial for him.  Overall he and his daughter report that things are stable.   Current Medications: Current Meds  Medication Sig   acetaminophen  (TYLENOL ) 500 MG tablet Take 1,000 mg by mouth daily.   apixaban  (ELIQUIS ) 5 MG TABS  tablet Take 1 tablet (5 mg total) by mouth 2 (two) times daily.   atenolol  (TENORMIN ) 25 MG tablet Take 25 mg by mouth daily as needed (if HR > 99 bpm afib).   atorvastatin  (LIPITOR) 10 MG tablet Take 10 mg by mouth daily.   cetirizine (ZYRTEC) 10 MG chewable tablet Chew 10 mg by mouth daily.   losartan (COZAAR) 50 MG tablet Take 50 mg by mouth daily.   miconazole (MICOTIN) 2 % powder Apply topically as needed for itching.   mupirocin ointment (BACTROBAN) 2 % 1 Application 2 (two) times daily.   nystatin (MYCOSTATIN/NYSTOP) powder SMARTSIG:2 Topical Twice Daily   spironolactone (ALDACTONE) 50 MG tablet Take 50 mg by mouth daily.   torsemide (DEMADEX) 20 MG tablet Take 20 mg by mouth daily.   triamcinolone  (KENALOG ) 0.025 % ointment Apply topically as directed.   vitamin C (ASCORBIC ACID ) 500 MG tablet Take 500 mg by mouth daily.   Vitamin D, Ergocalciferol, (DRISDOL) 1.25 MG (50000 UNIT) CAPS capsule Take 50,000 Units by mouth once a week.     Allergies:   Patient has no known allergies.   ROS:   Please see the history of present illness.    All other systems reviewed and are negative.  EKGs/Labs/Other Studies Reviewed:    The following studies were reviewed today: Cardiac Studies & Procedures   ______________________________________________________________________________________________  ECHOCARDIOGRAM  ECHOCARDIOGRAM COMPLETE 08/10/2021  Narrative ECHOCARDIOGRAM REPORT    Patient Name:   [name] [name] Date of Exam: 08/10/2021 Medical Rec #:  [id number]   Height:       71.0 in Accession #:    [id number]  Weight:       149.3 lb Date of Birth:  [birth year]   BSA:          1.862 m Patient Age:    80 years    BP:           114/43 mmHg Patient Gender: M           HR:           81 bpm. Exam Location:  Inpatient  Procedure: 2D Echo, Cardiac Doppler and Color Doppler  Indications:    Congestive Heart Failure I50.9  History:        Patient has prior history of Echocardiogram  examinations, most recent 02/17/2020. CAD, Arrythmias:Atrial Fibrillation; Risk Factors:Family History of Coronary Artery Disease, Former Smoker and Dyslipidemia.  Sonographer:    Lauraine Pilot RDCS Referring Phys: [id number] CASWELL CALL   Sonographer Comments: Suboptimal subcostal window. IMPRESSIONS   1. Left ventricular ejection fraction, by estimation, is 60 to 65%. The left ventricle has normal function. The left ventricle has no regional wall motion abnormalities. Left ventricular diastolic function could not be evaluated. 2. Right ventricular systolic function is normal. The right ventricular size is normal. There is normal pulmonary artery systolic pressure. 3. Left atrial size was mildly dilated. 4. The mitral valve is normal in structure. Trivial mitral valve regurgitation. No evidence of mitral stenosis. 5. The aortic valve is tricuspid. Aortic valve regurgitation is mild. Aortic valve sclerosis/calcification is present, without any evidence of aortic stenosis. Aortic regurgitation PHT measures 453 msec. 6. Aortic dilatation noted. There is mild dilatation of the aortic root, measuring 41 mm. There is mild dilatation of the ascending aorta, measuring 38 mm. 7. The inferior vena cava is normal in size with greater than 50% respiratory variability, suggesting right atrial pressure of 3 mmHg.  FINDINGS Left Ventricle: Left ventricular ejection fraction, by estimation, is 60 to 65%. The left ventricle has normal function. The left ventricle has no regional wall motion abnormalities. The left ventricular internal cavity size was normal in size. There is no left ventricular hypertrophy. Left ventricular diastolic function could not be evaluated.  Right Ventricle: The right ventricular size is normal. No increase in right ventricular wall thickness. Right ventricular systolic function is normal. There is normal pulmonary artery systolic pressure. The tricuspid regurgitant velocity is 2.04  m/s, and with an assumed right atrial pressure of 8 mmHg, the estimated right ventricular systolic pressure is 24.6 mmHg.  Left Atrium: Left atrial size was mildly dilated.  Right Atrium: Right atrial size was normal in size.  Pericardium: There is no evidence of pericardial effusion.  Mitral Valve: The mitral valve is normal in structure. Trivial mitral valve regurgitation. No evidence of mitral valve stenosis.  Tricuspid Valve: The tricuspid valve is normal in structure. Tricuspid valve regurgitation is trivial. No evidence of tricuspid stenosis.  Aortic Valve: The aortic valve is tricuspid. Aortic valve regurgitation is mild. Aortic regurgitation PHT measures 453 msec. Aortic valve sclerosis/calcification is present, without any evidence of aortic stenosis.  Pulmonic Valve: The pulmonic valve was normal in structure. Pulmonic valve regurgitation is not visualized. No evidence of pulmonic stenosis.  Aorta: Aortic dilatation noted. There is mild dilatation of the aortic root, measuring 41 mm. There  is mild dilatation of the ascending aorta, measuring 38 mm.  Venous: The inferior vena cava is normal in size with greater than 50% respiratory variability, suggesting right atrial pressure of 3 mmHg.  IAS/Shunts: No atrial level shunt detected by color flow Doppler.   LEFT VENTRICLE PLAX 2D LVIDd:         4.60 cm LVIDs:         3.40 cm LV PW:         0.90 cm LV IVS:        0.90 cm LVOT diam:     2.20 cm LV SV:         72 LV SV Index:   39 LVOT Area:     3.80 cm  LV Volumes (MOD) LV vol d, MOD A2C: 136.0 ml LV vol d, MOD A4C: 140.0 ml LV vol s, MOD A2C: 52.0 ml LV vol s, MOD A4C: 53.6 ml LV SV MOD A2C:     84.0 ml LV SV MOD A4C:     140.0 ml LV SV MOD BP:      89.4 ml  RIGHT VENTRICLE TAPSE (M-mode): 1.8 cm  LEFT ATRIUM             Index        RIGHT ATRIUM           Index LA diam:        4.60 cm 2.47 cm/m   RA Area:     19.60 cm LA Vol (A2C):   74.2 ml 39.85 ml/m  RA  Volume:   52.50 ml  28.20 ml/m LA Vol (A4C):   64.3 ml 34.53 ml/m LA Biplane Vol: 71.3 ml 38.29 ml/m AORTIC VALVE LVOT Vmax:   106.00 cm/s LVOT Vmean:  69.400 cm/s LVOT VTI:    0.189 m AI PHT:      453 msec  AORTA Ao Root diam: 4.10 cm Ao Asc diam:  3.80 cm  TRICUSPID VALVE TR Peak grad:   16.6 mmHg TR Vmax:        204.00 cm/s  SHUNTS Systemic VTI:  0.19 m Systemic Diam: 2.20 cm  Wilbert Bihari MD Electronically signed by Wilbert Bihari MD Signature Date/Time: 08/10/2021/2:38:30 PM    Final          ______________________________________________________________________________________________      EKG:   EKG Interpretation Date/Time:  Wednesday March 12 2024 10:35:28 EST Ventricular Rate:  89 PR Interval:    QRS Duration:  94 QT Interval:  386 QTC Calculation: 469 R Axis:   41  Text Interpretation: Atrial fibrillation Incomplete right bundle branch block When compared with ECG of 25-Oct-2022 10:25, Inverted T waves have replaced nonspecific T wave abnormality in Inferior leads Confirmed by Wonda Sharper [phone]) on 03/12/2024 10:43:22 AM    Recent Labs: 01/12/2024: ALT 6; BUN 19; Creatinine, Ser 1.29; Hemoglobin 14.0; Platelets 160; Potassium 3.5; Sodium 143  Recent Lipid Panel    Component Value Date/Time   CHOL 158 03/15/2021 0907   TRIG 55 03/15/2021 0907   HDL 58 03/15/2021 0907   CHOLHDL 2.7 03/15/2021 0907   CHOLHDL 3 08/02/2015 1055   VLDL 10.0 08/02/2015 1055   LDLCALC 89 03/15/2021 0907   LDLDIRECT 190.0 01/23/2008 1110     Risk Assessment/Calculations:    CHA2DS2-VASc Score =     This indicates a  % annual risk of stroke. The patient's score is based upon:  Physical Exam:    VS:  BP 116/60 (BP Location: Left Arm, Patient Position: Sitting, Cuff Size: Large)   Pulse 89   Ht 5' 11 (1.803 m)   Wt 187 lb 9.6 oz (85.1 kg)   SpO2 96%   BMI 26.16 kg/m     Wt Readings from Last 3 Encounters:  03/12/24 187 lb 9.6  oz (85.1 kg)  01/12/24 183 lb (83 kg)  01/08/24 211 lb 10.3 oz (96 kg)     GEN: Elderly male, in no acute distress HEENT: Normal NECK: No JVD; No carotid bruits LYMPHATICS: No lymphadenopathy CARDIAC: irregularly irregular, no murmurs, rubs, gallops RESPIRATORY:  Clear to auscultation without rales, wheezing or rhonchi  ABDOMEN: Soft, non-tender, non-distended MUSCULOSKELETAL: Diffuse edema below the knees with chronic stasis changes bilaterally.  The legs are not tense or weeping.; No deformity  SKIN: Warm and dry NEUROLOGIC:  Alert and oriented x 3 PSYCHIATRIC:  Normal affect   Assessment & Plan Coronary artery disease involving native coronary artery of native heart without angina pectoris No anginal symptoms.  Patient has been off of antiplatelet therapy because of chronic oral anticoagulation with apixaban .  He is treated with a statin drug. Persistent atrial fibrillation (HCC) Longstanding atrial fibrillation, intolerant to beta blockers and calcium  channel blockers.  Tolerating anticoagulation without bleeding problems.  Heart rate is reasonable off of AV nodal blockers. Nonrheumatic aortic valve insufficiency 2023 echo reviewed with mild AI, aortic sclerosis without stenosis.  No indication for repeat echo at this point. Pure hypercholesterolemia Treated with atorvastatin  10 mg daily. Venous stasis dermatitis of both lower extremities Appears clinically stable.  I reviewed his most recent labs with a creatinine of 1.29.  We had a lengthy discussion about the use of diuretics.  I recommended that he stay on torsemide 20 mg daily to help manage his fluid, even though it is not technically related to heart failure.  I suspect he would have more problems off of diuretic therapy.  He should also continue with current measures of close wound care follow-up, leg elevation when possible, and compression as needed.            Medication Adjustments/Labs and Tests Ordered: Current  medicines are reviewed at length with the patient today.  Concerns regarding medicines are outlined above.  Orders Placed This Encounter  Procedures   EKG 12-Lead   No orders of the defined types were placed in this encounter.   There are no Patient Instructions on file for this visit.   Signed, Ozell Fell, MD  03/12/2024 10:51 AM    [facility]

## 2024-03-12 NOTE — Assessment & Plan Note (Signed)
 No anginal symptoms.  Patient has been off of antiplatelet therapy because of chronic oral anticoagulation with apixaban .  He is treated with a statin drug.

## 2024-03-12 NOTE — Patient Instructions (Signed)

## 2024-03-17 DIAGNOSIS — I87321 Chronic venous hypertension (idiopathic) with inflammation of right lower extremity: Secondary | ICD-10-CM | POA: Diagnosis not present

## 2024-03-17 DIAGNOSIS — F32A Depression, unspecified: Secondary | ICD-10-CM | POA: Diagnosis not present

## 2024-03-17 DIAGNOSIS — E559 Vitamin D deficiency, unspecified: Secondary | ICD-10-CM | POA: Diagnosis not present

## 2024-03-17 DIAGNOSIS — I890 Lymphedema, not elsewhere classified: Secondary | ICD-10-CM | POA: Diagnosis not present

## 2024-03-17 DIAGNOSIS — F0394 Unspecified dementia, unspecified severity, with anxiety: Secondary | ICD-10-CM | POA: Diagnosis not present

## 2024-03-17 DIAGNOSIS — E782 Mixed hyperlipidemia: Secondary | ICD-10-CM | POA: Diagnosis not present

## 2024-03-17 DIAGNOSIS — F4321 Adjustment disorder with depressed mood: Secondary | ICD-10-CM | POA: Diagnosis not present

## 2024-03-17 DIAGNOSIS — I482 Chronic atrial fibrillation, unspecified: Secondary | ICD-10-CM | POA: Diagnosis not present

## 2024-03-17 DIAGNOSIS — I739 Peripheral vascular disease, unspecified: Secondary | ICD-10-CM | POA: Diagnosis not present

## 2024-03-17 DIAGNOSIS — G8929 Other chronic pain: Secondary | ICD-10-CM | POA: Diagnosis not present

## 2024-03-17 DIAGNOSIS — R600 Localized edema: Secondary | ICD-10-CM | POA: Diagnosis not present

## 2024-03-17 DIAGNOSIS — Z7901 Long term (current) use of anticoagulants: Secondary | ICD-10-CM | POA: Diagnosis not present

## 2024-03-17 DIAGNOSIS — F0393 Unspecified dementia, unspecified severity, with mood disturbance: Secondary | ICD-10-CM | POA: Diagnosis not present

## 2024-03-17 DIAGNOSIS — L97811 Non-pressure chronic ulcer of other part of right lower leg limited to breakdown of skin: Secondary | ICD-10-CM | POA: Diagnosis not present

## 2024-03-20 ENCOUNTER — Encounter (HOSPITAL_BASED_OUTPATIENT_CLINIC_OR_DEPARTMENT_OTHER): Admitting: Internal Medicine

## 2024-03-24 DIAGNOSIS — I87321 Chronic venous hypertension (idiopathic) with inflammation of right lower extremity: Secondary | ICD-10-CM | POA: Diagnosis not present

## 2024-03-24 DIAGNOSIS — E782 Mixed hyperlipidemia: Secondary | ICD-10-CM | POA: Diagnosis not present

## 2024-03-24 DIAGNOSIS — E559 Vitamin D deficiency, unspecified: Secondary | ICD-10-CM | POA: Diagnosis not present

## 2024-03-24 DIAGNOSIS — I739 Peripheral vascular disease, unspecified: Secondary | ICD-10-CM | POA: Diagnosis not present

## 2024-03-24 DIAGNOSIS — I890 Lymphedema, not elsewhere classified: Secondary | ICD-10-CM | POA: Diagnosis not present

## 2024-03-24 DIAGNOSIS — F4321 Adjustment disorder with depressed mood: Secondary | ICD-10-CM | POA: Diagnosis not present

## 2024-03-24 DIAGNOSIS — L97811 Non-pressure chronic ulcer of other part of right lower leg limited to breakdown of skin: Secondary | ICD-10-CM | POA: Diagnosis not present

## 2024-03-24 DIAGNOSIS — F0394 Unspecified dementia, unspecified severity, with anxiety: Secondary | ICD-10-CM | POA: Diagnosis not present

## 2024-03-24 DIAGNOSIS — G8929 Other chronic pain: Secondary | ICD-10-CM | POA: Diagnosis not present

## 2024-03-24 DIAGNOSIS — F0393 Unspecified dementia, unspecified severity, with mood disturbance: Secondary | ICD-10-CM | POA: Diagnosis not present

## 2024-03-24 DIAGNOSIS — F32A Depression, unspecified: Secondary | ICD-10-CM | POA: Diagnosis not present

## 2024-03-24 DIAGNOSIS — I482 Chronic atrial fibrillation, unspecified: Secondary | ICD-10-CM | POA: Diagnosis not present

## 2024-03-24 DIAGNOSIS — R600 Localized edema: Secondary | ICD-10-CM | POA: Diagnosis not present

## 2024-03-24 DIAGNOSIS — Z7901 Long term (current) use of anticoagulants: Secondary | ICD-10-CM | POA: Diagnosis not present

## 2024-03-28 DIAGNOSIS — I130 Hypertensive heart and chronic kidney disease with heart failure and stage 1 through stage 4 chronic kidney disease, or unspecified chronic kidney disease: Secondary | ICD-10-CM | POA: Diagnosis not present

## 2024-03-28 DIAGNOSIS — E782 Mixed hyperlipidemia: Secondary | ICD-10-CM | POA: Diagnosis not present

## 2024-03-28 DIAGNOSIS — I739 Peripheral vascular disease, unspecified: Secondary | ICD-10-CM | POA: Diagnosis not present

## 2024-03-28 DIAGNOSIS — I480 Paroxysmal atrial fibrillation: Secondary | ICD-10-CM | POA: Diagnosis not present

## 2024-04-01 DIAGNOSIS — F0393 Unspecified dementia, unspecified severity, with mood disturbance: Secondary | ICD-10-CM | POA: Diagnosis not present

## 2024-04-01 DIAGNOSIS — Z7901 Long term (current) use of anticoagulants: Secondary | ICD-10-CM | POA: Diagnosis not present

## 2024-04-01 DIAGNOSIS — I739 Peripheral vascular disease, unspecified: Secondary | ICD-10-CM | POA: Diagnosis not present

## 2024-04-01 DIAGNOSIS — I87321 Chronic venous hypertension (idiopathic) with inflammation of right lower extremity: Secondary | ICD-10-CM | POA: Diagnosis not present

## 2024-04-01 DIAGNOSIS — E559 Vitamin D deficiency, unspecified: Secondary | ICD-10-CM | POA: Diagnosis not present

## 2024-04-01 DIAGNOSIS — F4321 Adjustment disorder with depressed mood: Secondary | ICD-10-CM | POA: Diagnosis not present

## 2024-04-01 DIAGNOSIS — F32A Depression, unspecified: Secondary | ICD-10-CM | POA: Diagnosis not present

## 2024-04-01 DIAGNOSIS — G8929 Other chronic pain: Secondary | ICD-10-CM | POA: Diagnosis not present

## 2024-04-01 DIAGNOSIS — L97811 Non-pressure chronic ulcer of other part of right lower leg limited to breakdown of skin: Secondary | ICD-10-CM | POA: Diagnosis not present

## 2024-04-01 DIAGNOSIS — F0394 Unspecified dementia, unspecified severity, with anxiety: Secondary | ICD-10-CM | POA: Diagnosis not present

## 2024-04-01 DIAGNOSIS — R600 Localized edema: Secondary | ICD-10-CM | POA: Diagnosis not present

## 2024-04-01 DIAGNOSIS — I890 Lymphedema, not elsewhere classified: Secondary | ICD-10-CM | POA: Diagnosis not present

## 2024-04-01 DIAGNOSIS — E782 Mixed hyperlipidemia: Secondary | ICD-10-CM | POA: Diagnosis not present

## 2024-04-01 DIAGNOSIS — I482 Chronic atrial fibrillation, unspecified: Secondary | ICD-10-CM | POA: Diagnosis not present

## 2024-04-03 ENCOUNTER — Encounter (HOSPITAL_BASED_OUTPATIENT_CLINIC_OR_DEPARTMENT_OTHER): Attending: Internal Medicine | Admitting: Internal Medicine

## 2024-04-03 DIAGNOSIS — I890 Lymphedema, not elsewhere classified: Secondary | ICD-10-CM | POA: Insufficient documentation

## 2024-04-03 DIAGNOSIS — I87312 Chronic venous hypertension (idiopathic) with ulcer of left lower extremity: Secondary | ICD-10-CM | POA: Insufficient documentation

## 2024-04-03 DIAGNOSIS — F0390 Unspecified dementia without behavioral disturbance: Secondary | ICD-10-CM | POA: Insufficient documentation

## 2024-04-03 DIAGNOSIS — L03116 Cellulitis of left lower limb: Secondary | ICD-10-CM | POA: Insufficient documentation

## 2024-04-03 DIAGNOSIS — L97822 Non-pressure chronic ulcer of other part of left lower leg with fat layer exposed: Secondary | ICD-10-CM | POA: Diagnosis not present

## 2024-04-07 DIAGNOSIS — M728 Other fibroblastic disorders: Secondary | ICD-10-CM | POA: Diagnosis not present

## 2024-04-07 DIAGNOSIS — I480 Paroxysmal atrial fibrillation: Secondary | ICD-10-CM | POA: Diagnosis not present

## 2024-04-07 DIAGNOSIS — I130 Hypertensive heart and chronic kidney disease with heart failure and stage 1 through stage 4 chronic kidney disease, or unspecified chronic kidney disease: Secondary | ICD-10-CM | POA: Diagnosis not present

## 2024-04-07 DIAGNOSIS — E559 Vitamin D deficiency, unspecified: Secondary | ICD-10-CM | POA: Diagnosis not present

## 2024-04-08 DIAGNOSIS — E559 Vitamin D deficiency, unspecified: Secondary | ICD-10-CM | POA: Diagnosis not present

## 2024-04-08 DIAGNOSIS — M728 Other fibroblastic disorders: Secondary | ICD-10-CM | POA: Diagnosis not present

## 2024-04-08 DIAGNOSIS — I130 Hypertensive heart and chronic kidney disease with heart failure and stage 1 through stage 4 chronic kidney disease, or unspecified chronic kidney disease: Secondary | ICD-10-CM | POA: Diagnosis not present

## 2024-04-08 DIAGNOSIS — I480 Paroxysmal atrial fibrillation: Secondary | ICD-10-CM | POA: Diagnosis not present

## 2024-04-10 ENCOUNTER — Encounter (HOSPITAL_BASED_OUTPATIENT_CLINIC_OR_DEPARTMENT_OTHER): Admitting: Internal Medicine

## 2024-04-10 DIAGNOSIS — G301 Alzheimer's disease with late onset: Secondary | ICD-10-CM | POA: Diagnosis not present

## 2024-04-10 DIAGNOSIS — I890 Lymphedema, not elsewhere classified: Secondary | ICD-10-CM | POA: Diagnosis not present

## 2024-04-10 DIAGNOSIS — N1832 Chronic kidney disease, stage 3b: Secondary | ICD-10-CM | POA: Diagnosis not present

## 2024-04-10 DIAGNOSIS — L97822 Non-pressure chronic ulcer of other part of left lower leg with fat layer exposed: Secondary | ICD-10-CM | POA: Diagnosis not present

## 2024-04-10 DIAGNOSIS — I87312 Chronic venous hypertension (idiopathic) with ulcer of left lower extremity: Secondary | ICD-10-CM | POA: Diagnosis not present

## 2024-04-10 DIAGNOSIS — I480 Paroxysmal atrial fibrillation: Secondary | ICD-10-CM | POA: Diagnosis not present

## 2024-04-10 DIAGNOSIS — E1122 Type 2 diabetes mellitus with diabetic chronic kidney disease: Secondary | ICD-10-CM | POA: Diagnosis not present

## 2024-04-30 ENCOUNTER — Encounter (HOSPITAL_BASED_OUTPATIENT_CLINIC_OR_DEPARTMENT_OTHER): Admitting: Internal Medicine

## 2024-05-28 ENCOUNTER — Encounter (HOSPITAL_BASED_OUTPATIENT_CLINIC_OR_DEPARTMENT_OTHER): Admitting: Internal Medicine

## 2024-06-18 ENCOUNTER — Encounter (HOSPITAL_BASED_OUTPATIENT_CLINIC_OR_DEPARTMENT_OTHER): Admitting: Internal Medicine
